# Patient Record
Sex: Male | Born: 1950 | Race: Black or African American | Hispanic: No | Marital: Married | Smoking: Former smoker
Health system: Southern US, Community
[De-identification: ages and names within clinical notes are randomized; demographics above are authoritative.]

## PROBLEM LIST (undated history)

## (undated) DIAGNOSIS — I10 Essential (primary) hypertension: Secondary | ICD-10-CM

## (undated) DIAGNOSIS — E119 Type 2 diabetes mellitus without complications: Secondary | ICD-10-CM

## (undated) DIAGNOSIS — K219 Gastro-esophageal reflux disease without esophagitis: Secondary | ICD-10-CM

## (undated) DIAGNOSIS — C189 Malignant neoplasm of colon, unspecified: Secondary | ICD-10-CM

## (undated) HISTORY — DX: Type 2 diabetes mellitus without complications: E11.9

## (undated) HISTORY — PX: COLON SURGERY: SHX602

## (undated) HISTORY — DX: Malignant neoplasm of colon, unspecified: C18.9

## (undated) HISTORY — PX: APPENDECTOMY: SHX54

---

## 2001-04-29 HISTORY — PX: SHOULDER SURGERY: SHX246

## 2015-03-20 ENCOUNTER — Encounter: Payer: Self-pay | Admitting: Gastroenterology

## 2015-04-05 ENCOUNTER — Other Ambulatory Visit: Payer: Self-pay

## 2015-04-05 ENCOUNTER — Encounter: Payer: Self-pay | Admitting: Gastroenterology

## 2015-04-05 ENCOUNTER — Ambulatory Visit (INDEPENDENT_AMBULATORY_CARE_PROVIDER_SITE_OTHER): Payer: PRIVATE HEALTH INSURANCE | Admitting: Gastroenterology

## 2015-04-05 VITALS — BP 149/96 | HR 79 | Temp 97.9°F | Ht 71.0 in | Wt 219.8 lb

## 2015-04-05 DIAGNOSIS — D126 Benign neoplasm of colon, unspecified: Secondary | ICD-10-CM | POA: Insufficient documentation

## 2015-04-05 DIAGNOSIS — K635 Polyp of colon: Secondary | ICD-10-CM

## 2015-04-05 DIAGNOSIS — K219 Gastro-esophageal reflux disease without esophagitis: Secondary | ICD-10-CM | POA: Diagnosis not present

## 2015-04-05 MED ORDER — OMEPRAZOLE 20 MG PO CPDR: DELAYED_RELEASE_CAPSULE | ORAL | Status: DC

## 2015-04-05 NOTE — Assessment & Plan Note (Signed)
SYMPTOMS NOT CONTROLLED.  DRINK WATER TO KEEP YOUR URINE LIGHT YELLOW. FOLLOW A HIGH FIBER/LOW FAT DIET. AVOID FRIED FOODS. AVOID ITEMS THAT CAUSE BLOATING & GAS. SEE INFO BELOW. AVOID FOOD THAT TRIGGERS REFLUX. SEE INFO BELOW.  START OMEPRAZOLE.  TAKE 30 MINUTES PRIOR TO YOUR FIRST MEAL EVERY DAY TO PREVENT ULCERS AND CONTROL HEART BURN. FOLLOW UP IN 4 MOS.

## 2015-04-05 NOTE — Progress Notes (Signed)
CC'ED TO PCP 

## 2015-04-05 NOTE — Patient Instructions (Signed)
DRINK WATER TO KEEP YOUR URINE LIGHT YELLOW.  FOLLOW A HIGH FIBER/LOW FAT DIET. AVOID FRIED FOODS. AVOID ITEMS THAT CAUSE BLOATING & GAS. SEE INFO BELOW.  AVOID FOOD THAT TRIGGERS REFLUX. SEE INFO BELOW.   START OMEPRAZOLE.  TAKE 30 MINUTES PRIOR TO YOUR FIRST MEAL EVERY DAY TO PREVENT ULCERS AND CONTROL HEART BURN.   COMPLETE ENDOSCOPIC MUCOSAL RESECTION AT [facility]. WE WILL TRY TO GET IT DONE BEFORE DEC 31.  FOLLOW UP IN 4 MOS.   YOU NEED A COLONOSCOPY IN ONE YEAR.   Lifestyle and home remedies TO CONTROL HEARTBURN You may eliminate or reduce the frequency of heartburn by making the following lifestyle changes:  . Control your weight. Being overweight is a major risk factor for heartburn and GERD. Excess pounds put pressure on your abdomen, pushing up your stomach and causing acid to back up into your esophagus.   . Eat smaller meals. 4 TO 6 MEALS A DAY. This reduces pressure on the lower esophageal sphincter, helping to prevent the valve from opening and acid from washing back into your esophagus.   Christopher Friedman your belt. Clothes that fit tightly around your waist put pressure on your abdomen and the lower esophageal sphincter.   . Eliminate heartburn triggers. Everyone has specific triggers. Common triggers such as fatty or fried foods, spicy food, tomato sauce, carbonated beverages, alcohol, chocolate, mint, garlic, onion, caffeine and nicotine may make heartburn worse.   Marland Kitchen Avoid stooping or bending. Tying your shoes is OK. Bending over for longer periods to weed your garden isn't, especially soon after eating.   . Don't lie down after a meal. Wait at least three to four hours after eating before going to bed, and don't lie down right after eating.   Alternative medicine . Several home remedies exist for treating GERD, but they provide only temporary relief. They include drinking baking soda (sodium bicarbonate) added to water or drinking other fluids such as baking soda mixed with  cream of tartar and water. . Although these liquids create temporary relief by neutralizing, washing away or buffering acids, eventually they aggravate the situation by adding gas and fluid to your stomach, increasing pressure and causing more acid reflux. Further, adding more sodium to your diet may increase your blood pressure and add stress to your heart, and excessive bicarbonate ingestion can alter the acid-base balance in your body.  High-Fiber Diet A high-fiber diet changes your normal diet to include more whole grains, legumes, fruits, and vegetables. Changes in the diet involve replacing refined carbohydrates with unrefined foods. The calorie level of the diet is essentially unchanged. The Dietary Reference Intake (recommended amount) for adult males is 38 grams per day. For adult females, it is 25 grams per day. Pregnant and lactating women should consume 28 grams of fiber per day. Fiber is the intact part of a plant that is not broken down during digestion. Functional fiber is fiber that has been isolated from the plant to provide a beneficial effect in the body. PURPOSE  Increase stool bulk.   Ease and regulate bowel movements.   Lower cholesterol.  REDUCE RISK OF COLON CANCER  INDICATIONS THAT YOU NEED MORE FIBER  Constipation and hemorrhoids.   Uncomplicated diverticulosis (intestine condition) and irritable bowel syndrome.   Weight management.   As a protective measure against hardening of the arteries (atherosclerosis), diabetes, and cancer.   GUIDELINES FOR INCREASING FIBER IN THE DIET  Start adding fiber to the diet slowly. A gradual increase of  about 5 more grams (2 slices of whole-wheat bread, 2 servings of most fruits or vegetables, or 1 bowl of high-fiber cereal) per day is best. Too rapid an increase in fiber may result in constipation, flatulence, and bloating.   Drink enough water and fluids to keep your urine clear or pale yellow. Water, juice, or caffeine-free  drinks are recommended. Not drinking enough fluid may cause constipation.   Eat a variety of high-fiber foods rather than one type of fiber.   Try to increase your intake of fiber through using high-fiber foods rather than fiber pills or supplements that contain small amounts of fiber.   The goal is to change the types of food eaten. Do not supplement your present diet with high-fiber foods, but replace foods in your present diet.  INCLUDE A VARIETY OF FIBER SOURCES  Replace refined and processed grains with whole grains, canned fruits with fresh fruits, and incorporate other fiber sources. White rice, white breads, and most bakery goods contain little or no fiber.   Brown whole-grain rice, buckwheat oats, and many fruits and vegetables are all good sources of fiber. These include: broccoli, Brussels sprouts, cabbage, cauliflower, beets, sweet potatoes, white potatoes (skin on), carrots, tomatoes, eggplant, squash, berries, fresh fruits, and dried fruits.   Cereals appear to be the richest source of fiber. Cereal fiber is found in whole grains and bran. Bran is the fiber-rich outer coat of cereal grain, which is largely removed in refining. In whole-grain cereals, the bran remains. In breakfast cereals, the largest amount of fiber is found in those with "bran" in their names. The fiber content is sometimes indicated on the label.   You may need to include additional fruits and vegetables each day.   In baking, for 1 cup white flour, you may use the following substitutions:   1 cup whole-wheat flour minus 2 tablespoons.   1/2 cup white flour plus 1/2 cup whole-wheat flour.

## 2015-04-05 NOTE — Progress Notes (Signed)
   Subjective:    Patient ID: Christopher Friedman, male    DOB: April 07, 1951, 64 y.o.   MRN: [id number] [name], MD  HPI Pt and wife concerned because he did not want to have surgery. [place] RUSHED. HESITANT TO HAVE SURGERY DUE TO PREVIOUS COMPLICATION OF SURGERY FROM FAMILY MEDICINE. HAS R INGUINAL HERNIA. HAD FIRST TCS NOV 2016 FOR RECTAL BLEEDING. NO BRBPR now. Bowels regular. No weight loss or change in appetite. Heartburn frequently. PT DENIES FEVER, CHILLS, HEMATOCHEZIA, nausea, vomiting, melena, diarrhea, CHANGE IN BOWEL IN HABITS, constipation, abdominal pain, or roblems swallowing.  Past Medical History  Diagnosis Date  . Diabetes [facility])     Past Surgical History  Procedure Laterality Date  . Shoulder surgery  2003    No Known Allergies  Current Outpatient Prescriptions  Medication Sig Dispense Refill  . Ascorbic Acid (VITAMIN C) 1000 MG tablet Take 1,000 mg by mouth daily.    Marland Kitchen aspirin 81 MG tablet Take 81 mg by mouth daily.    Marland Kitchen BEE POLLEN PO Take by mouth.    . Blood Glucose Monitoring Suppl (ONE TOUCH ULTRA MINI) W/DEVICE KIT USE TO CHECK BLOOD SUGAR DAILY.    Marland Kitchen Coenzyme Q10 (CO Q 10 PO) Take by mouth.    .      . Glucosamine-Chondroit-Collagen ([employer] GLUCO-CHONDROIT PLUS UC-II PO) Take by mouth.     Family History  Problem Relation Age of Onset  . Colon cancer Neg Hx   . Colon polyps Neg Hx     Social History  Substance Use Topics  . Smoking status: Former Smoker    Quit date: 04/05/1995  . Smokeless tobacco: None  . Alcohol Use: None   Review of Systems PER HPI OTHERWISE ALL SYSTEMS ARE NEGATIVE.    Objective:   Physical Exam  Constitutional: He is oriented to person, place, and time. He appears well-developed and well-nourished. No distress.  HENT:  Head: Normocephalic and atraumatic.  Mouth/Throat: Oropharynx is clear and moist. No oropharyngeal exudate.  Eyes: Pupils are equal, round, and reactive to light. No scleral icterus.  Neck: Normal range of  motion. Neck supple.  Cardiovascular: Normal rate, regular rhythm and normal heart sounds.   Pulmonary/Chest: Effort normal and breath sounds normal. No respiratory distress.  Abdominal: Soft. Bowel sounds are normal. He exhibits no distension. There is no tenderness.  Musculoskeletal: He exhibits no edema.  Lymphadenopathy:    He has no cervical adenopathy.  Neurological: He is alert and oriented to person, place, and time.  NO FOCAL DEFICITS  Psychiatric: He has a normal mood and affect.  Vitals reviewed.     Assessment & Plan:

## 2015-04-05 NOTE — Progress Notes (Signed)
ON RECALL  °

## 2015-04-05 NOTE — Assessment & Plan Note (Signed)
IN PROXIMAL COLON AND NEAR GE JUNCTION(??). 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  DRINK WATER TO KEEP YOUR URINE LIGHT YELLOW. FOLLOW A HIGH FIBER/LOW FAT DIET. AVOID FRIED FOODS. AVOID ITEMS THAT CAUSE BLOATING & GAS. SEE INFO BELOW. AVOID FOOD THAT TRIGGERS REFLUX. SEE INFO BELOW.  START OMEPRAZOLE.  TAKE 30 MINUTES PRIOR TO YOUR FIRST MEAL EVERY DAY TO PREVENT ULCERS AND CONTROL HEART BURN. COMPLETE ENDOSCOPIC MUCOSAL RESECTION AT [facility]. WE WILL TRY TO GET IT DONE BEFORE DEC 31. DISCUSSED PROCEDURE, BENEFITS, RISKS, AND MANAGEMENT OF ADVANCED POLYP/ENDOSCOPY. FOLLOW UP IN 4 MOS.   YOU NEED A COLONOSCOPY IN ONE YEAR.

## 2015-07-19 ENCOUNTER — Encounter: Payer: Self-pay | Admitting: Gastroenterology

## 2016-02-29 DIAGNOSIS — C189 Malignant neoplasm of colon, unspecified: Secondary | ICD-10-CM | POA: Diagnosis not present

## 2016-02-29 DIAGNOSIS — Z452 Encounter for adjustment and management of vascular access device: Secondary | ICD-10-CM | POA: Diagnosis not present

## 2016-02-29 DIAGNOSIS — C787 Secondary malignant neoplasm of liver and intrahepatic bile duct: Secondary | ICD-10-CM | POA: Diagnosis not present

## 2016-03-06 ENCOUNTER — Encounter: Payer: Self-pay | Admitting: Gastroenterology

## 2016-03-12 DIAGNOSIS — C787 Secondary malignant neoplasm of liver and intrahepatic bile duct: Secondary | ICD-10-CM | POA: Diagnosis not present

## 2016-03-12 DIAGNOSIS — Z452 Encounter for adjustment and management of vascular access device: Secondary | ICD-10-CM | POA: Diagnosis not present

## 2016-03-12 DIAGNOSIS — C189 Malignant neoplasm of colon, unspecified: Secondary | ICD-10-CM | POA: Diagnosis not present

## 2016-03-14 DIAGNOSIS — C189 Malignant neoplasm of colon, unspecified: Secondary | ICD-10-CM | POA: Diagnosis not present

## 2016-03-14 DIAGNOSIS — Z452 Encounter for adjustment and management of vascular access device: Secondary | ICD-10-CM | POA: Diagnosis not present

## 2016-03-14 DIAGNOSIS — C787 Secondary malignant neoplasm of liver and intrahepatic bile duct: Secondary | ICD-10-CM | POA: Diagnosis not present

## 2016-03-26 DIAGNOSIS — C189 Malignant neoplasm of colon, unspecified: Secondary | ICD-10-CM | POA: Diagnosis not present

## 2016-03-26 DIAGNOSIS — Z452 Encounter for adjustment and management of vascular access device: Secondary | ICD-10-CM | POA: Diagnosis not present

## 2016-03-26 DIAGNOSIS — C787 Secondary malignant neoplasm of liver and intrahepatic bile duct: Secondary | ICD-10-CM | POA: Diagnosis not present

## 2016-03-28 DIAGNOSIS — Z452 Encounter for adjustment and management of vascular access device: Secondary | ICD-10-CM | POA: Diagnosis not present

## 2016-03-28 DIAGNOSIS — C787 Secondary malignant neoplasm of liver and intrahepatic bile duct: Secondary | ICD-10-CM | POA: Diagnosis not present

## 2016-03-28 DIAGNOSIS — C189 Malignant neoplasm of colon, unspecified: Secondary | ICD-10-CM | POA: Diagnosis not present

## 2016-04-08 DIAGNOSIS — Z7982 Long term (current) use of aspirin: Secondary | ICD-10-CM | POA: Diagnosis not present

## 2016-04-08 DIAGNOSIS — R51 Headache: Secondary | ICD-10-CM | POA: Diagnosis not present

## 2016-04-08 DIAGNOSIS — M13869 Other specified arthritis, unspecified knee: Secondary | ICD-10-CM | POA: Diagnosis not present

## 2016-04-08 DIAGNOSIS — E119 Type 2 diabetes mellitus without complications: Secondary | ICD-10-CM | POA: Diagnosis not present

## 2016-04-08 DIAGNOSIS — C189 Malignant neoplasm of colon, unspecified: Secondary | ICD-10-CM | POA: Diagnosis not present

## 2016-04-08 DIAGNOSIS — I10 Essential (primary) hypertension: Secondary | ICD-10-CM | POA: Diagnosis not present

## 2016-04-08 DIAGNOSIS — K219 Gastro-esophageal reflux disease without esophagitis: Secondary | ICD-10-CM | POA: Diagnosis not present

## 2016-04-09 DIAGNOSIS — C189 Malignant neoplasm of colon, unspecified: Secondary | ICD-10-CM | POA: Diagnosis not present

## 2016-04-18 DIAGNOSIS — Z7982 Long term (current) use of aspirin: Secondary | ICD-10-CM | POA: Diagnosis not present

## 2016-04-18 DIAGNOSIS — K219 Gastro-esophageal reflux disease without esophagitis: Secondary | ICD-10-CM | POA: Diagnosis not present

## 2016-04-18 DIAGNOSIS — M13869 Other specified arthritis, unspecified knee: Secondary | ICD-10-CM | POA: Diagnosis not present

## 2016-04-18 DIAGNOSIS — C189 Malignant neoplasm of colon, unspecified: Secondary | ICD-10-CM | POA: Diagnosis not present

## 2016-04-18 DIAGNOSIS — E119 Type 2 diabetes mellitus without complications: Secondary | ICD-10-CM | POA: Diagnosis not present

## 2016-04-18 DIAGNOSIS — I10 Essential (primary) hypertension: Secondary | ICD-10-CM | POA: Diagnosis not present

## 2016-04-23 DIAGNOSIS — Z7982 Long term (current) use of aspirin: Secondary | ICD-10-CM | POA: Diagnosis not present

## 2016-04-23 DIAGNOSIS — E119 Type 2 diabetes mellitus without complications: Secondary | ICD-10-CM | POA: Diagnosis not present

## 2016-04-23 DIAGNOSIS — C189 Malignant neoplasm of colon, unspecified: Secondary | ICD-10-CM | POA: Diagnosis not present

## 2016-04-23 DIAGNOSIS — I10 Essential (primary) hypertension: Secondary | ICD-10-CM | POA: Diagnosis not present

## 2016-04-23 DIAGNOSIS — M13869 Other specified arthritis, unspecified knee: Secondary | ICD-10-CM | POA: Diagnosis not present

## 2016-04-23 DIAGNOSIS — K219 Gastro-esophageal reflux disease without esophagitis: Secondary | ICD-10-CM | POA: Diagnosis not present

## 2016-05-20 DIAGNOSIS — Z7901 Long term (current) use of anticoagulants: Secondary | ICD-10-CM | POA: Diagnosis not present

## 2016-05-20 DIAGNOSIS — Z79899 Other long term (current) drug therapy: Secondary | ICD-10-CM | POA: Diagnosis not present

## 2016-05-20 DIAGNOSIS — C189 Malignant neoplasm of colon, unspecified: Secondary | ICD-10-CM | POA: Diagnosis not present

## 2016-05-20 DIAGNOSIS — I10 Essential (primary) hypertension: Secondary | ICD-10-CM | POA: Diagnosis not present

## 2016-05-20 DIAGNOSIS — M179 Osteoarthritis of knee, unspecified: Secondary | ICD-10-CM | POA: Diagnosis not present

## 2016-05-20 DIAGNOSIS — Z9221 Personal history of antineoplastic chemotherapy: Secondary | ICD-10-CM | POA: Diagnosis not present

## 2016-05-20 DIAGNOSIS — R51 Headache: Secondary | ICD-10-CM | POA: Diagnosis not present

## 2016-05-20 DIAGNOSIS — C7801 Secondary malignant neoplasm of right lung: Secondary | ICD-10-CM | POA: Diagnosis not present

## 2016-05-20 DIAGNOSIS — K219 Gastro-esophageal reflux disease without esophagitis: Secondary | ICD-10-CM | POA: Diagnosis not present

## 2016-05-20 DIAGNOSIS — C787 Secondary malignant neoplasm of liver and intrahepatic bile duct: Secondary | ICD-10-CM | POA: Diagnosis not present

## 2016-05-20 DIAGNOSIS — K4090 Unilateral inguinal hernia, without obstruction or gangrene, not specified as recurrent: Secondary | ICD-10-CM | POA: Diagnosis not present

## 2016-05-20 DIAGNOSIS — Z6830 Body mass index (BMI) 30.0-30.9, adult: Secondary | ICD-10-CM | POA: Diagnosis not present

## 2016-05-20 DIAGNOSIS — E119 Type 2 diabetes mellitus without complications: Secondary | ICD-10-CM | POA: Diagnosis not present

## 2016-05-21 DIAGNOSIS — C189 Malignant neoplasm of colon, unspecified: Secondary | ICD-10-CM | POA: Diagnosis not present

## 2016-07-09 DIAGNOSIS — E1165 Type 2 diabetes mellitus with hyperglycemia: Secondary | ICD-10-CM | POA: Diagnosis not present

## 2016-07-09 DIAGNOSIS — C187 Malignant neoplasm of sigmoid colon: Secondary | ICD-10-CM | POA: Diagnosis not present

## 2016-07-09 DIAGNOSIS — Z6830 Body mass index (BMI) 30.0-30.9, adult: Secondary | ICD-10-CM | POA: Diagnosis not present

## 2016-07-09 DIAGNOSIS — E8881 Metabolic syndrome: Secondary | ICD-10-CM | POA: Diagnosis not present

## 2016-07-09 DIAGNOSIS — E782 Mixed hyperlipidemia: Secondary | ICD-10-CM | POA: Diagnosis not present

## 2016-07-09 DIAGNOSIS — Z9189 Other specified personal risk factors, not elsewhere classified: Secondary | ICD-10-CM | POA: Diagnosis not present

## 2016-07-10 DIAGNOSIS — C189 Malignant neoplasm of colon, unspecified: Secondary | ICD-10-CM | POA: Diagnosis not present

## 2016-07-10 DIAGNOSIS — G629 Polyneuropathy, unspecified: Secondary | ICD-10-CM | POA: Diagnosis not present

## 2016-07-10 DIAGNOSIS — C787 Secondary malignant neoplasm of liver and intrahepatic bile duct: Secondary | ICD-10-CM | POA: Diagnosis not present

## 2016-07-10 DIAGNOSIS — I10 Essential (primary) hypertension: Secondary | ICD-10-CM | POA: Diagnosis not present

## 2016-07-10 DIAGNOSIS — C7800 Secondary malignant neoplasm of unspecified lung: Secondary | ICD-10-CM | POA: Diagnosis not present

## 2016-07-10 DIAGNOSIS — Z9221 Personal history of antineoplastic chemotherapy: Secondary | ICD-10-CM | POA: Diagnosis not present

## 2016-07-10 DIAGNOSIS — E119 Type 2 diabetes mellitus without complications: Secondary | ICD-10-CM | POA: Diagnosis not present

## 2016-07-10 DIAGNOSIS — M179 Osteoarthritis of knee, unspecified: Secondary | ICD-10-CM | POA: Diagnosis not present

## 2016-07-10 DIAGNOSIS — K219 Gastro-esophageal reflux disease without esophagitis: Secondary | ICD-10-CM | POA: Diagnosis not present

## 2016-07-10 DIAGNOSIS — Z7982 Long term (current) use of aspirin: Secondary | ICD-10-CM | POA: Diagnosis not present

## 2016-07-16 DIAGNOSIS — C189 Malignant neoplasm of colon, unspecified: Secondary | ICD-10-CM | POA: Diagnosis not present

## 2016-09-16 ENCOUNTER — Encounter: Payer: Self-pay | Admitting: Hematology

## 2016-09-16 DIAGNOSIS — G629 Polyneuropathy, unspecified: Secondary | ICD-10-CM | POA: Diagnosis not present

## 2016-09-16 DIAGNOSIS — Z79899 Other long term (current) drug therapy: Secondary | ICD-10-CM | POA: Diagnosis not present

## 2016-09-16 DIAGNOSIS — C189 Malignant neoplasm of colon, unspecified: Secondary | ICD-10-CM | POA: Diagnosis not present

## 2016-09-16 DIAGNOSIS — C787 Secondary malignant neoplasm of liver and intrahepatic bile duct: Secondary | ICD-10-CM | POA: Diagnosis not present

## 2016-09-16 DIAGNOSIS — K219 Gastro-esophageal reflux disease without esophagitis: Secondary | ICD-10-CM | POA: Diagnosis not present

## 2016-09-16 DIAGNOSIS — M179 Osteoarthritis of knee, unspecified: Secondary | ICD-10-CM | POA: Diagnosis not present

## 2016-09-16 DIAGNOSIS — Z7984 Long term (current) use of oral hypoglycemic drugs: Secondary | ICD-10-CM | POA: Diagnosis not present

## 2016-09-16 DIAGNOSIS — E119 Type 2 diabetes mellitus without complications: Secondary | ICD-10-CM | POA: Diagnosis not present

## 2016-09-16 DIAGNOSIS — C7800 Secondary malignant neoplasm of unspecified lung: Secondary | ICD-10-CM | POA: Diagnosis not present

## 2016-09-16 DIAGNOSIS — I10 Essential (primary) hypertension: Secondary | ICD-10-CM | POA: Diagnosis not present

## 2016-09-16 DIAGNOSIS — Z7982 Long term (current) use of aspirin: Secondary | ICD-10-CM | POA: Diagnosis not present

## 2016-09-16 DIAGNOSIS — K4090 Unilateral inguinal hernia, without obstruction or gangrene, not specified as recurrent: Secondary | ICD-10-CM | POA: Diagnosis not present

## 2016-09-20 DIAGNOSIS — C189 Malignant neoplasm of colon, unspecified: Secondary | ICD-10-CM | POA: Diagnosis not present

## 2016-11-22 DIAGNOSIS — K219 Gastro-esophageal reflux disease without esophagitis: Secondary | ICD-10-CM | POA: Diagnosis not present

## 2016-11-22 DIAGNOSIS — I10 Essential (primary) hypertension: Secondary | ICD-10-CM | POA: Diagnosis not present

## 2016-11-22 DIAGNOSIS — C7800 Secondary malignant neoplasm of unspecified lung: Secondary | ICD-10-CM | POA: Diagnosis not present

## 2016-11-22 DIAGNOSIS — C189 Malignant neoplasm of colon, unspecified: Secondary | ICD-10-CM | POA: Diagnosis not present

## 2016-11-22 DIAGNOSIS — E119 Type 2 diabetes mellitus without complications: Secondary | ICD-10-CM | POA: Diagnosis not present

## 2016-11-22 DIAGNOSIS — M179 Osteoarthritis of knee, unspecified: Secondary | ICD-10-CM | POA: Diagnosis not present

## 2016-11-22 DIAGNOSIS — C787 Secondary malignant neoplasm of liver and intrahepatic bile duct: Secondary | ICD-10-CM | POA: Diagnosis not present

## 2016-11-22 DIAGNOSIS — M1711 Unilateral primary osteoarthritis, right knee: Secondary | ICD-10-CM | POA: Diagnosis not present

## 2016-11-22 DIAGNOSIS — G629 Polyneuropathy, unspecified: Secondary | ICD-10-CM | POA: Diagnosis not present

## 2016-11-26 DIAGNOSIS — C189 Malignant neoplasm of colon, unspecified: Secondary | ICD-10-CM | POA: Diagnosis not present

## 2017-01-07 DIAGNOSIS — Z9189 Other specified personal risk factors, not elsewhere classified: Secondary | ICD-10-CM | POA: Diagnosis not present

## 2017-01-07 DIAGNOSIS — Z0001 Encounter for general adult medical examination with abnormal findings: Secondary | ICD-10-CM | POA: Diagnosis not present

## 2017-01-07 DIAGNOSIS — C187 Malignant neoplasm of sigmoid colon: Secondary | ICD-10-CM | POA: Diagnosis not present

## 2017-01-07 DIAGNOSIS — Z1389 Encounter for screening for other disorder: Secondary | ICD-10-CM | POA: Diagnosis not present

## 2017-01-07 DIAGNOSIS — E8881 Metabolic syndrome: Secondary | ICD-10-CM | POA: Diagnosis not present

## 2017-01-07 DIAGNOSIS — Z6829 Body mass index (BMI) 29.0-29.9, adult: Secondary | ICD-10-CM | POA: Diagnosis not present

## 2017-01-07 DIAGNOSIS — E782 Mixed hyperlipidemia: Secondary | ICD-10-CM | POA: Diagnosis not present

## 2017-01-07 DIAGNOSIS — Z23 Encounter for immunization: Secondary | ICD-10-CM | POA: Diagnosis not present

## 2017-01-07 DIAGNOSIS — E1165 Type 2 diabetes mellitus with hyperglycemia: Secondary | ICD-10-CM | POA: Diagnosis not present

## 2017-01-16 DIAGNOSIS — E8881 Metabolic syndrome: Secondary | ICD-10-CM | POA: Diagnosis not present

## 2017-01-16 DIAGNOSIS — Z9189 Other specified personal risk factors, not elsewhere classified: Secondary | ICD-10-CM | POA: Diagnosis not present

## 2017-01-23 DIAGNOSIS — C189 Malignant neoplasm of colon, unspecified: Secondary | ICD-10-CM | POA: Diagnosis not present

## 2017-01-23 DIAGNOSIS — M1711 Unilateral primary osteoarthritis, right knee: Secondary | ICD-10-CM | POA: Diagnosis not present

## 2017-01-23 DIAGNOSIS — I10 Essential (primary) hypertension: Secondary | ICD-10-CM | POA: Diagnosis not present

## 2017-01-23 DIAGNOSIS — C787 Secondary malignant neoplasm of liver and intrahepatic bile duct: Secondary | ICD-10-CM | POA: Diagnosis not present

## 2017-01-23 DIAGNOSIS — C7800 Secondary malignant neoplasm of unspecified lung: Secondary | ICD-10-CM | POA: Diagnosis not present

## 2017-01-23 DIAGNOSIS — K219 Gastro-esophageal reflux disease without esophagitis: Secondary | ICD-10-CM | POA: Diagnosis not present

## 2017-01-23 DIAGNOSIS — E119 Type 2 diabetes mellitus without complications: Secondary | ICD-10-CM | POA: Diagnosis not present

## 2017-01-28 DIAGNOSIS — D72829 Elevated white blood cell count, unspecified: Secondary | ICD-10-CM | POA: Diagnosis not present

## 2017-01-28 DIAGNOSIS — C189 Malignant neoplasm of colon, unspecified: Secondary | ICD-10-CM | POA: Diagnosis not present

## 2017-05-06 DIAGNOSIS — I10 Essential (primary) hypertension: Secondary | ICD-10-CM | POA: Diagnosis not present

## 2017-05-06 DIAGNOSIS — Z85118 Personal history of other malignant neoplasm of bronchus and lung: Secondary | ICD-10-CM | POA: Diagnosis not present

## 2017-05-06 DIAGNOSIS — Z7982 Long term (current) use of aspirin: Secondary | ICD-10-CM | POA: Diagnosis not present

## 2017-05-06 DIAGNOSIS — C189 Malignant neoplasm of colon, unspecified: Secondary | ICD-10-CM | POA: Diagnosis not present

## 2017-05-06 DIAGNOSIS — G629 Polyneuropathy, unspecified: Secondary | ICD-10-CM | POA: Diagnosis not present

## 2017-05-06 DIAGNOSIS — Z9221 Personal history of antineoplastic chemotherapy: Secondary | ICD-10-CM | POA: Diagnosis not present

## 2017-05-06 DIAGNOSIS — E119 Type 2 diabetes mellitus without complications: Secondary | ICD-10-CM | POA: Diagnosis not present

## 2017-05-06 DIAGNOSIS — Z7984 Long term (current) use of oral hypoglycemic drugs: Secondary | ICD-10-CM | POA: Diagnosis not present

## 2017-05-06 DIAGNOSIS — R51 Headache: Secondary | ICD-10-CM | POA: Diagnosis not present

## 2017-05-06 DIAGNOSIS — Z85038 Personal history of other malignant neoplasm of large intestine: Secondary | ICD-10-CM | POA: Diagnosis not present

## 2017-05-13 DIAGNOSIS — I10 Essential (primary) hypertension: Secondary | ICD-10-CM | POA: Diagnosis not present

## 2017-05-13 DIAGNOSIS — G629 Polyneuropathy, unspecified: Secondary | ICD-10-CM | POA: Diagnosis not present

## 2017-05-13 DIAGNOSIS — R51 Headache: Secondary | ICD-10-CM | POA: Diagnosis not present

## 2017-05-13 DIAGNOSIS — Z85118 Personal history of other malignant neoplasm of bronchus and lung: Secondary | ICD-10-CM | POA: Diagnosis not present

## 2017-05-13 DIAGNOSIS — Z7984 Long term (current) use of oral hypoglycemic drugs: Secondary | ICD-10-CM | POA: Diagnosis not present

## 2017-05-13 DIAGNOSIS — Z7982 Long term (current) use of aspirin: Secondary | ICD-10-CM | POA: Diagnosis not present

## 2017-05-13 DIAGNOSIS — C189 Malignant neoplasm of colon, unspecified: Secondary | ICD-10-CM | POA: Diagnosis not present

## 2017-05-13 DIAGNOSIS — Z9221 Personal history of antineoplastic chemotherapy: Secondary | ICD-10-CM | POA: Diagnosis not present

## 2017-05-13 DIAGNOSIS — Z85038 Personal history of other malignant neoplasm of large intestine: Secondary | ICD-10-CM | POA: Diagnosis not present

## 2017-05-13 DIAGNOSIS — E119 Type 2 diabetes mellitus without complications: Secondary | ICD-10-CM | POA: Diagnosis not present

## 2017-05-14 DIAGNOSIS — Z85038 Personal history of other malignant neoplasm of large intestine: Secondary | ICD-10-CM | POA: Diagnosis not present

## 2017-05-14 DIAGNOSIS — C189 Malignant neoplasm of colon, unspecified: Secondary | ICD-10-CM | POA: Diagnosis not present

## 2017-05-14 DIAGNOSIS — Z7984 Long term (current) use of oral hypoglycemic drugs: Secondary | ICD-10-CM | POA: Diagnosis not present

## 2017-05-14 DIAGNOSIS — R51 Headache: Secondary | ICD-10-CM | POA: Diagnosis not present

## 2017-05-14 DIAGNOSIS — I10 Essential (primary) hypertension: Secondary | ICD-10-CM | POA: Diagnosis not present

## 2017-05-14 DIAGNOSIS — Z9221 Personal history of antineoplastic chemotherapy: Secondary | ICD-10-CM | POA: Diagnosis not present

## 2017-05-14 DIAGNOSIS — Z85118 Personal history of other malignant neoplasm of bronchus and lung: Secondary | ICD-10-CM | POA: Diagnosis not present

## 2017-05-14 DIAGNOSIS — G629 Polyneuropathy, unspecified: Secondary | ICD-10-CM | POA: Diagnosis not present

## 2017-05-14 DIAGNOSIS — E119 Type 2 diabetes mellitus without complications: Secondary | ICD-10-CM | POA: Diagnosis not present

## 2017-05-14 DIAGNOSIS — Z7982 Long term (current) use of aspirin: Secondary | ICD-10-CM | POA: Diagnosis not present

## 2017-05-21 DIAGNOSIS — Z9221 Personal history of antineoplastic chemotherapy: Secondary | ICD-10-CM | POA: Diagnosis not present

## 2017-05-21 DIAGNOSIS — Z85038 Personal history of other malignant neoplasm of large intestine: Secondary | ICD-10-CM | POA: Diagnosis not present

## 2017-05-21 DIAGNOSIS — C189 Malignant neoplasm of colon, unspecified: Secondary | ICD-10-CM | POA: Diagnosis not present

## 2017-05-21 DIAGNOSIS — R51 Headache: Secondary | ICD-10-CM | POA: Diagnosis not present

## 2017-05-21 DIAGNOSIS — G629 Polyneuropathy, unspecified: Secondary | ICD-10-CM | POA: Diagnosis not present

## 2017-05-21 DIAGNOSIS — Z85118 Personal history of other malignant neoplasm of bronchus and lung: Secondary | ICD-10-CM | POA: Diagnosis not present

## 2017-05-21 DIAGNOSIS — I10 Essential (primary) hypertension: Secondary | ICD-10-CM | POA: Diagnosis not present

## 2017-05-21 DIAGNOSIS — E119 Type 2 diabetes mellitus without complications: Secondary | ICD-10-CM | POA: Diagnosis not present

## 2017-05-21 DIAGNOSIS — Z7982 Long term (current) use of aspirin: Secondary | ICD-10-CM | POA: Diagnosis not present

## 2017-05-21 DIAGNOSIS — Z7984 Long term (current) use of oral hypoglycemic drugs: Secondary | ICD-10-CM | POA: Diagnosis not present

## 2017-05-23 DIAGNOSIS — I10 Essential (primary) hypertension: Secondary | ICD-10-CM | POA: Diagnosis not present

## 2017-05-23 DIAGNOSIS — Z85038 Personal history of other malignant neoplasm of large intestine: Secondary | ICD-10-CM | POA: Diagnosis not present

## 2017-05-23 DIAGNOSIS — Z7984 Long term (current) use of oral hypoglycemic drugs: Secondary | ICD-10-CM | POA: Diagnosis not present

## 2017-05-23 DIAGNOSIS — Z9221 Personal history of antineoplastic chemotherapy: Secondary | ICD-10-CM | POA: Diagnosis not present

## 2017-05-23 DIAGNOSIS — Z85118 Personal history of other malignant neoplasm of bronchus and lung: Secondary | ICD-10-CM | POA: Diagnosis not present

## 2017-05-23 DIAGNOSIS — G629 Polyneuropathy, unspecified: Secondary | ICD-10-CM | POA: Diagnosis not present

## 2017-05-23 DIAGNOSIS — E119 Type 2 diabetes mellitus without complications: Secondary | ICD-10-CM | POA: Diagnosis not present

## 2017-05-23 DIAGNOSIS — Z7982 Long term (current) use of aspirin: Secondary | ICD-10-CM | POA: Diagnosis not present

## 2017-05-23 DIAGNOSIS — R51 Headache: Secondary | ICD-10-CM | POA: Diagnosis not present

## 2017-06-04 DIAGNOSIS — Z452 Encounter for adjustment and management of vascular access device: Secondary | ICD-10-CM | POA: Diagnosis not present

## 2017-06-04 DIAGNOSIS — E1142 Type 2 diabetes mellitus with diabetic polyneuropathy: Secondary | ICD-10-CM | POA: Diagnosis not present

## 2017-06-04 DIAGNOSIS — C189 Malignant neoplasm of colon, unspecified: Secondary | ICD-10-CM | POA: Diagnosis not present

## 2017-06-04 DIAGNOSIS — I10 Essential (primary) hypertension: Secondary | ICD-10-CM | POA: Diagnosis not present

## 2017-06-04 DIAGNOSIS — M13869 Other specified arthritis, unspecified knee: Secondary | ICD-10-CM | POA: Diagnosis not present

## 2017-06-04 DIAGNOSIS — Z5111 Encounter for antineoplastic chemotherapy: Secondary | ICD-10-CM | POA: Diagnosis not present

## 2017-06-04 DIAGNOSIS — R160 Hepatomegaly, not elsewhere classified: Secondary | ICD-10-CM | POA: Diagnosis not present

## 2017-06-04 DIAGNOSIS — E119 Type 2 diabetes mellitus without complications: Secondary | ICD-10-CM | POA: Diagnosis not present

## 2017-06-04 DIAGNOSIS — K219 Gastro-esophageal reflux disease without esophagitis: Secondary | ICD-10-CM | POA: Diagnosis not present

## 2017-06-06 DIAGNOSIS — I10 Essential (primary) hypertension: Secondary | ICD-10-CM | POA: Diagnosis not present

## 2017-06-06 DIAGNOSIS — C189 Malignant neoplasm of colon, unspecified: Secondary | ICD-10-CM | POA: Diagnosis not present

## 2017-06-06 DIAGNOSIS — E119 Type 2 diabetes mellitus without complications: Secondary | ICD-10-CM | POA: Diagnosis not present

## 2017-06-06 DIAGNOSIS — K219 Gastro-esophageal reflux disease without esophagitis: Secondary | ICD-10-CM | POA: Diagnosis not present

## 2017-06-06 DIAGNOSIS — Z452 Encounter for adjustment and management of vascular access device: Secondary | ICD-10-CM | POA: Diagnosis not present

## 2017-06-06 DIAGNOSIS — E1142 Type 2 diabetes mellitus with diabetic polyneuropathy: Secondary | ICD-10-CM | POA: Diagnosis not present

## 2017-06-06 DIAGNOSIS — R160 Hepatomegaly, not elsewhere classified: Secondary | ICD-10-CM | POA: Diagnosis not present

## 2017-06-06 DIAGNOSIS — Z5111 Encounter for antineoplastic chemotherapy: Secondary | ICD-10-CM | POA: Diagnosis not present

## 2017-06-06 DIAGNOSIS — M13869 Other specified arthritis, unspecified knee: Secondary | ICD-10-CM | POA: Diagnosis not present

## 2017-06-11 DIAGNOSIS — C187 Malignant neoplasm of sigmoid colon: Secondary | ICD-10-CM | POA: Diagnosis not present

## 2017-06-11 DIAGNOSIS — C189 Malignant neoplasm of colon, unspecified: Secondary | ICD-10-CM | POA: Diagnosis not present

## 2017-06-18 DIAGNOSIS — C189 Malignant neoplasm of colon, unspecified: Secondary | ICD-10-CM | POA: Diagnosis not present

## 2017-06-24 DIAGNOSIS — M13869 Other specified arthritis, unspecified knee: Secondary | ICD-10-CM | POA: Diagnosis not present

## 2017-06-24 DIAGNOSIS — E119 Type 2 diabetes mellitus without complications: Secondary | ICD-10-CM | POA: Diagnosis not present

## 2017-06-24 DIAGNOSIS — R160 Hepatomegaly, not elsewhere classified: Secondary | ICD-10-CM | POA: Diagnosis not present

## 2017-06-24 DIAGNOSIS — Z452 Encounter for adjustment and management of vascular access device: Secondary | ICD-10-CM | POA: Diagnosis not present

## 2017-06-24 DIAGNOSIS — I10 Essential (primary) hypertension: Secondary | ICD-10-CM | POA: Diagnosis not present

## 2017-06-24 DIAGNOSIS — Z5111 Encounter for antineoplastic chemotherapy: Secondary | ICD-10-CM | POA: Diagnosis not present

## 2017-06-24 DIAGNOSIS — C189 Malignant neoplasm of colon, unspecified: Secondary | ICD-10-CM | POA: Diagnosis not present

## 2017-06-24 DIAGNOSIS — E1142 Type 2 diabetes mellitus with diabetic polyneuropathy: Secondary | ICD-10-CM | POA: Diagnosis not present

## 2017-06-24 DIAGNOSIS — K219 Gastro-esophageal reflux disease without esophagitis: Secondary | ICD-10-CM | POA: Diagnosis not present

## 2017-06-26 DIAGNOSIS — E119 Type 2 diabetes mellitus without complications: Secondary | ICD-10-CM | POA: Diagnosis not present

## 2017-06-26 DIAGNOSIS — C189 Malignant neoplasm of colon, unspecified: Secondary | ICD-10-CM | POA: Diagnosis not present

## 2017-06-26 DIAGNOSIS — Z452 Encounter for adjustment and management of vascular access device: Secondary | ICD-10-CM | POA: Diagnosis not present

## 2017-06-26 DIAGNOSIS — R160 Hepatomegaly, not elsewhere classified: Secondary | ICD-10-CM | POA: Diagnosis not present

## 2017-06-26 DIAGNOSIS — I10 Essential (primary) hypertension: Secondary | ICD-10-CM | POA: Diagnosis not present

## 2017-06-26 DIAGNOSIS — E1142 Type 2 diabetes mellitus with diabetic polyneuropathy: Secondary | ICD-10-CM | POA: Diagnosis not present

## 2017-06-26 DIAGNOSIS — M13869 Other specified arthritis, unspecified knee: Secondary | ICD-10-CM | POA: Diagnosis not present

## 2017-06-26 DIAGNOSIS — K219 Gastro-esophageal reflux disease without esophagitis: Secondary | ICD-10-CM | POA: Diagnosis not present

## 2017-06-26 DIAGNOSIS — Z5111 Encounter for antineoplastic chemotherapy: Secondary | ICD-10-CM | POA: Diagnosis not present

## 2017-07-01 DIAGNOSIS — Z809 Family history of malignant neoplasm, unspecified: Secondary | ICD-10-CM | POA: Diagnosis not present

## 2017-07-01 DIAGNOSIS — C189 Malignant neoplasm of colon, unspecified: Secondary | ICD-10-CM | POA: Diagnosis not present

## 2017-07-01 DIAGNOSIS — Z85118 Personal history of other malignant neoplasm of bronchus and lung: Secondary | ICD-10-CM | POA: Diagnosis not present

## 2017-07-01 DIAGNOSIS — C3490 Malignant neoplasm of unspecified part of unspecified bronchus or lung: Secondary | ICD-10-CM | POA: Diagnosis not present

## 2017-07-01 DIAGNOSIS — Z806 Family history of leukemia: Secondary | ICD-10-CM | POA: Diagnosis not present

## 2017-07-01 DIAGNOSIS — Z8500 Personal history of malignant neoplasm of unspecified digestive organ: Secondary | ICD-10-CM | POA: Diagnosis not present

## 2017-07-01 DIAGNOSIS — C4090 Malignant neoplasm of unspecified bones and articular cartilage of unspecified limb: Secondary | ICD-10-CM | POA: Diagnosis not present

## 2017-07-01 DIAGNOSIS — C220 Liver cell carcinoma: Secondary | ICD-10-CM | POA: Diagnosis not present

## 2017-07-01 DIAGNOSIS — C9590 Leukemia, unspecified not having achieved remission: Secondary | ICD-10-CM | POA: Diagnosis not present

## 2017-07-02 ENCOUNTER — Telehealth (HOSPITAL_COMMUNITY): Payer: Self-pay | Admitting: Hematology

## 2017-07-03 ENCOUNTER — Encounter (HOSPITAL_COMMUNITY): Payer: Self-pay | Admitting: Adult Health

## 2017-07-03 ENCOUNTER — Other Ambulatory Visit (HOSPITAL_COMMUNITY): Payer: Self-pay | Admitting: *Deleted

## 2017-07-03 ENCOUNTER — Other Ambulatory Visit (HOSPITAL_COMMUNITY): Payer: Self-pay | Admitting: Adult Health

## 2017-07-03 DIAGNOSIS — C189 Malignant neoplasm of colon, unspecified: Secondary | ICD-10-CM

## 2017-07-03 DIAGNOSIS — C787 Secondary malignant neoplasm of liver and intrahepatic bile duct: Principal | ICD-10-CM

## 2017-07-03 NOTE — Progress Notes (Signed)
Error in previous note.   Patient will be seen at the discretion of Dr. Delton Coombes with subsequent cycles of chemotherapy.    Mike Craze, NP [place] [phone]

## 2017-07-03 NOTE — Progress Notes (Signed)
Patient's records from [facility] in [place], [place] received and reviewed.  Patient is transferring his care to [facility].   Treatment plan for FOLFIRI created, with 20% dose-reduction in 5-FU and Irinotecan based on documentation from Dr. Tomie China last office visit there on 06/24/17.    Weight and BSA based off of recorded office visit documentation from 06/24/17: Weight 218.8 lbs; BSA 2.19 m2.    Patient will see Dr. Delton Coombes here at [facility] prior to upcoming next scheduled cycle of treatment.    Mike Craze, NP [place] [phone]

## 2017-07-04 ENCOUNTER — Other Ambulatory Visit (HOSPITAL_COMMUNITY): Payer: Self-pay | Admitting: Adult Health

## 2017-07-04 ENCOUNTER — Telehealth (HOSPITAL_COMMUNITY): Payer: Self-pay | Admitting: Hematology

## 2017-07-04 NOTE — Telephone Encounter (Signed)
Left vm for Christopher Friedman [phone] asking her to contact [facility] and discontinue pts current chemo auth.

## 2017-07-09 ENCOUNTER — Inpatient Hospital Stay (HOSPITAL_BASED_OUTPATIENT_CLINIC_OR_DEPARTMENT_OTHER): Payer: Medicare PPO | Admitting: Hematology

## 2017-07-09 ENCOUNTER — Encounter (HOSPITAL_COMMUNITY): Payer: Self-pay

## 2017-07-09 ENCOUNTER — Inpatient Hospital Stay (HOSPITAL_COMMUNITY): Payer: Medicare PPO | Attending: Internal Medicine

## 2017-07-09 ENCOUNTER — Ambulatory Visit (HOSPITAL_COMMUNITY): Payer: Medicare PPO

## 2017-07-09 ENCOUNTER — Other Ambulatory Visit: Payer: Self-pay

## 2017-07-09 VITALS — Ht 72.0 in

## 2017-07-09 VITALS — BP 133/76 | HR 68 | Temp 97.7°F | Resp 18 | Wt 213.4 lb

## 2017-07-09 DIAGNOSIS — Z5111 Encounter for antineoplastic chemotherapy: Secondary | ICD-10-CM | POA: Diagnosis not present

## 2017-07-09 DIAGNOSIS — C189 Malignant neoplasm of colon, unspecified: Secondary | ICD-10-CM

## 2017-07-09 DIAGNOSIS — Z7984 Long term (current) use of oral hypoglycemic drugs: Secondary | ICD-10-CM

## 2017-07-09 DIAGNOSIS — C7800 Secondary malignant neoplasm of unspecified lung: Secondary | ICD-10-CM | POA: Diagnosis not present

## 2017-07-09 DIAGNOSIS — Z794 Long term (current) use of insulin: Secondary | ICD-10-CM | POA: Insufficient documentation

## 2017-07-09 DIAGNOSIS — E139 Other specified diabetes mellitus without complications: Secondary | ICD-10-CM

## 2017-07-09 DIAGNOSIS — E119 Type 2 diabetes mellitus without complications: Secondary | ICD-10-CM | POA: Diagnosis not present

## 2017-07-09 DIAGNOSIS — C787 Secondary malignant neoplasm of liver and intrahepatic bile duct: Secondary | ICD-10-CM

## 2017-07-09 DIAGNOSIS — G620 Drug-induced polyneuropathy: Secondary | ICD-10-CM

## 2017-07-09 DIAGNOSIS — I10 Essential (primary) hypertension: Secondary | ICD-10-CM

## 2017-07-09 LAB — CBC WITH DIFFERENTIAL/PLATELET
Basophils Absolute: 0 10*3/uL (ref 0.0–0.1)
Basophils Relative: 1 %
EOS ABS: 0.1 10*3/uL (ref 0.0–0.7)
Eosinophils Relative: 1 %
HCT: 37.4 % — ABNORMAL LOW (ref 39.0–52.0)
HEMOGLOBIN: 12.2 g/dL — AB (ref 13.0–17.0)
LYMPHS ABS: 2.1 10*3/uL (ref 0.7–4.0)
LYMPHS PCT: 41 %
MCH: 28.8 pg (ref 26.0–34.0)
MCHC: 32.6 g/dL (ref 30.0–36.0)
MCV: 88.2 fL (ref 78.0–100.0)
MONOS PCT: 8 %
Monocytes Absolute: 0.4 10*3/uL (ref 0.1–1.0)
NEUTROS PCT: 49 %
Neutro Abs: 2.5 10*3/uL (ref 1.7–7.7)
Platelets: 178 10*3/uL (ref 150–400)
RBC: 4.24 MIL/uL (ref 4.22–5.81)
RDW: 15 % (ref 11.5–15.5)
WBC: 5.1 10*3/uL (ref 4.0–10.5)

## 2017-07-09 LAB — COMPREHENSIVE METABOLIC PANEL
ALT: 24 U/L (ref 17–63)
AST: 24 U/L (ref 15–41)
Albumin: 4.1 g/dL (ref 3.5–5.0)
Alkaline Phosphatase: 102 U/L (ref 38–126)
Anion gap: 13 (ref 5–15)
BUN: 21 mg/dL — AB (ref 6–20)
CO2: 25 mmol/L (ref 22–32)
CREATININE: 1.16 mg/dL (ref 0.61–1.24)
Calcium: 9.4 mg/dL (ref 8.9–10.3)
Chloride: 92 mmol/L — ABNORMAL LOW (ref 101–111)
Glucose, Bld: 416 mg/dL — ABNORMAL HIGH (ref 65–99)
POTASSIUM: 4 mmol/L (ref 3.5–5.1)
SODIUM: 130 mmol/L — AB (ref 135–145)
Total Bilirubin: 0.9 mg/dL (ref 0.3–1.2)
Total Protein: 7.3 g/dL (ref 6.5–8.1)

## 2017-07-09 MED ORDER — SODIUM CHLORIDE 0.9 % IV SOLN
400.0000 mg/m2 | Freq: Once | INTRAVENOUS | Status: DC
  Filled 2017-07-09: qty 43.8

## 2017-07-09 MED ORDER — DEXAMETHASONE SODIUM PHOSPHATE 10 MG/ML IJ SOLN
INTRAMUSCULAR | Status: AC
  Filled 2017-07-09: qty 1

## 2017-07-09 MED ORDER — ATROPINE SULFATE 1 MG/ML IJ SOLN
0.5000 mg | Freq: Once | INTRAMUSCULAR | Status: AC | PRN
  Administered 2017-07-09: 0.5 mg via INTRAVENOUS
  Filled 2017-07-09: qty 1

## 2017-07-09 MED ORDER — SODIUM CHLORIDE 0.9 % IV SOLN
144.0000 mg/m2 | Freq: Once | INTRAVENOUS | Status: AC
  Administered 2017-07-09: 320 mg via INTRAVENOUS
  Filled 2017-07-09: qty 14

## 2017-07-09 MED ORDER — PALONOSETRON HCL INJECTION 0.25 MG/5ML
0.2500 mg | Freq: Once | INTRAVENOUS | Status: AC
  Administered 2017-07-09: 0.25 mg via INTRAVENOUS

## 2017-07-09 MED ORDER — DEXAMETHASONE SODIUM PHOSPHATE 10 MG/ML IJ SOLN
10.0000 mg | Freq: Once | INTRAMUSCULAR | Status: AC
  Administered 2017-07-09: 10 mg via INTRAVENOUS

## 2017-07-09 MED ORDER — INSULIN ASPART 100 UNIT/ML ~~LOC~~ SOLN
10.0000 [IU] | Freq: Once | SUBCUTANEOUS | Status: AC
  Administered 2017-07-09: 10 [IU] via SUBCUTANEOUS
  Filled 2017-07-09: qty 0.1

## 2017-07-09 MED ORDER — PALONOSETRON HCL INJECTION 0.25 MG/5ML
INTRAVENOUS | Status: AC
  Filled 2017-07-09: qty 5

## 2017-07-09 MED ORDER — SODIUM CHLORIDE 0.9 % IV SOLN
1920.0000 mg/m2 | INTRAVENOUS | Status: DC
  Administered 2017-07-09: 4200 mg via INTRAVENOUS
  Filled 2017-07-09: qty 84

## 2017-07-09 MED ORDER — SODIUM CHLORIDE 0.9% FLUSH
10.0000 mL | INTRAVENOUS | Status: DC | PRN
Start: 2017-07-09 — End: 2017-07-09
  Administered 2017-07-09: 10 mL
  Filled 2017-07-09: qty 10

## 2017-07-09 MED ORDER — SODIUM CHLORIDE 0.9 % IV SOLN
400.0000 mg/m2 | Freq: Once | INTRAVENOUS | Status: AC
  Administered 2017-07-09: 876 mg via INTRAVENOUS
  Filled 2017-07-09: qty 43.8

## 2017-07-09 MED ORDER — FLUOROURACIL CHEMO INJECTION 2.5 GM/50ML
320.0000 mg/m2 | Freq: Once | INTRAVENOUS | Status: AC
  Administered 2017-07-09: 700 mg via INTRAVENOUS
  Filled 2017-07-09: qty 14

## 2017-07-09 MED ORDER — SODIUM CHLORIDE 0.9 % IV SOLN: 10.0000 mg | Freq: Once | INTRAVENOUS | Status: DC

## 2017-07-09 MED ORDER — SODIUM CHLORIDE 0.9 % IV SOLN
Freq: Once | INTRAVENOUS | Status: AC
  Administered 2017-07-09: 10:00:00 via INTRAVENOUS

## 2017-07-09 NOTE — Patient Instructions (Signed)
[facility] Discharge Instructions for Patients Receiving Chemotherapy   Beginning January 23rd 2017 lab work for the Cancer Center will be done in the  Main lab at [place] on 1st floor. If you have a lab appointment with the Cancer Center please come in thru the  Main Entrance and check in at the main information desk   Today you received the following chemotherapy agents   To help prevent nausea and vomiting after your treatment, we encourage you to take your nausea medication     If you develop nausea and vomiting, or diarrhea that is not controlled by your medication, call the clinic.  The clinic phone number is [phone]. Office hours are Monday-Friday 8:30am-5:00pm.  BELOW ARE SYMPTOMS THAT SHOULD BE REPORTED IMMEDIATELY:  *FEVER GREATER THAN 101.0 F  *CHILLS WITH OR WITHOUT FEVER  NAUSEA AND VOMITING THAT IS NOT CONTROLLED WITH YOUR NAUSEA MEDICATION  *UNUSUAL SHORTNESS OF BREATH  *UNUSUAL BRUISING OR BLEEDING  TENDERNESS IN MOUTH AND THROAT WITH OR WITHOUT PRESENCE OF ULCERS  *URINARY PROBLEMS  *BOWEL PROBLEMS  UNUSUAL RASH Items with * indicate a potential emergency and should be followed up as soon as possible. If you have an emergency after office hours please contact your primary care physician or go to the nearest emergency department.  Please call the clinic during office hours if you have any questions or concerns.   You may also contact the Patient Navigator at [phone] should you have any questions or need assistance in obtaining follow up care.      Resources For Cancer Patients and their Caregivers ? American Cancer Society: Can assist with transportation, wigs, general needs, runs Look Good Feel Better.        1-888-227-6333 ? Cancer Care: Provides financial assistance, online support groups, medication/co-pay assistance.  1-800-813-HOPE (4673) ? [facility] Assists [place] cancer  patients and their families through emotional , educational and financial support.  336-427-4357 ? [place] DSS Where to apply for food stamps, Medicaid and utility assistance. 336-342-1394 ? RCATS: Transportation to medical appointments. 336-347-2287 ? Social Security Administration: May apply for disability if have a Stage IV cancer. 336-342-7796 1-800-772-1213 ? [place] Aging, Disability and Transit Services: Assists with nutrition, care and transit needs. 336-349-2343         

## 2017-07-09 NOTE — Progress Notes (Signed)
[place], [place] [address]   CLINIC:  Medical Oncology/Hematology  PCP:  [name], MD [place] [phone]   REASON FOR VISIT:  Follow-up for chemotherapy.  CURRENT THERAPY: Patient here for cycle 4 of FOLFIRI.  BRIEF ONCOLOGIC HISTORY:   No history exists.  1. 12 cycles of FOLFIRI from 08/30/2015 through 02/12/2016, maintenance 5-FU and leucovorin from 02/27/2016 through 03/26/2016, chemotherapy discontinued due to patient preference 2.  Bevacizumab held after cycle 5 secondary to GI bleed 3.  CT scan on 05/13/2017 shows a 7.7 cm mass in the left lobe of the liver, 9.6 mm nodule at the left lung base consistent with recurrence     INTERVAL HISTORY:  Christopher Friedman 67 y.o. male returns for routine follow-up and consideration for next cycle of chemotherapy.   Due for cycle #cycle 4  today.   Overall, he tells me he has been feeling pretty well. Energy levels have decreased for 2-3 days after the last cycle of chemotherapy.  However he continued to work part-time.; appetite has been great.  Denies any nausea, vomiting or abdominal cramping.  He takes Imodium for diarrhea as needed.   Overall, he feels ready for next cycle of chemo today.     REVIEW OF SYSTEMS:  Review of Systems  Constitutional: Positive for fatigue. Negative for appetite change.  HENT:   Positive for mouth sores.   Cardiovascular: Negative.   Gastrointestinal: Positive for diarrhea. Negative for abdominal pain, constipation, nausea and vomiting.  Musculoskeletal: Negative.   Skin: Negative.   Hematological: Negative.      PAST MEDICAL/SURGICAL HISTORY:  Past Medical History:  Diagnosis Date  . Diabetes [facility])    Past Surgical History:  Procedure Laterality Date  . SHOULDER SURGERY  2003     SOCIAL HISTORY:  Social History   Socioeconomic History  . Marital status: Unknown    Spouse name: Not on file  . Number of children:  Not on file  . Years of education: Not on file  . Highest education level: Not on file  Social Needs  . Financial resource strain: Not on file  . Food insecurity - worry: Not on file  . Food insecurity - inability: Not on file  . Transportation needs - medical: Not on file  . Transportation needs - non-medical: Not on file  Occupational History  . Not on file  Tobacco Use  . Smoking status: Former Smoker    Last attempt to quit: 04/05/1995    Years since quitting: 22.2  Substance and Sexual Activity  . Alcohol use: Not on file  . Drug use: Not on file  . Sexual activity: Not on file  Other Topics Concern  . Not on file  Social History Narrative  . Not on file    FAMILY HISTORY:  Family History  Problem Relation Age of Onset  . Colon cancer Neg Hx   . Colon polyps Neg Hx     CURRENT MEDICATIONS:  Outpatient Encounter Medications as of 07/09/2017  Medication Sig Note  . Ascorbic Acid (VITAMIN C) 1000 MG tablet Take 1,000 mg by mouth daily.   Marland Kitchen aspirin 81 MG tablet Take 81 mg by mouth daily.   Marland Kitchen BEE POLLEN PO Take by mouth.   . Blood Glucose Monitoring Suppl (ONE TOUCH ULTRA MINI) W/DEVICE KIT USE TO CHECK BLOOD SUGAR DAILY. 04/05/2015: Received from: External Pharmacy  . Coenzyme Q10 (CO Q 10 PO)  Take by mouth.   . erythromycin (E-MYCIN) 250 MG tablet TAKE TWO (2) TABLETS BY MOUTH AT 100 PM, 200 PM, AND 1000 PM PRIOR TO SURGERY 04/05/2015: Received from: External Pharmacy  . Glucosamine-Chondroit-Collagen ([facility] GLUCO-CHONDROIT PLUS UC-II PO) Take by mouth.   . metFORMIN (GLUCOPHAGE) 500 MG tablet TAKE ONE TABLET BY MOUTH ONCE DAILY FOR ONE WEEK. IF NO DIARRHEA INCREASE TO TWICE DAILY   . omeprazole (PRILOSEC) 20 MG capsule 1 PO 30 mins prior to breakfast   . valsartan-hydrochlorothiazide (DIOVAN-HCT) 160-25 MG tablet Take 1 tablet by mouth daily.    No facility-administered encounter medications on file as of 07/09/2017.     ALLERGIES:  No Known Allergies   PHYSICAL EXAM:   ECOG Performance status: 1  There were no vitals filed for this visit. There were no vitals filed for this visit.  Physical Exam  Constitutional: He is oriented to person, place, and time and well-developed, well-nourished, and in no distress.  Cardiovascular: Normal rate and regular rhythm.  Pulmonary/Chest: Effort normal and breath sounds normal.  Abdominal: Soft. He exhibits no mass. There is no tenderness.  Musculoskeletal: He exhibits no edema.  Neurological: He is alert and oriented to person, place, and time.     LABORATORY DATA:  I have reviewed the labs as listed.  CBC    Component Value Date/Time   WBC 5.1 07/09/2017 0850   RBC 4.24 07/09/2017 0850   HGB 12.2 (L) 07/09/2017 0850   HCT 37.4 (L) 07/09/2017 0850   PLT 178 07/09/2017 0850   MCV 88.2 07/09/2017 0850   MCH 28.8 07/09/2017 0850   MCHC 32.6 07/09/2017 0850   RDW 15.0 07/09/2017 0850   LYMPHSABS 2.1 07/09/2017 0850   MONOABS 0.4 07/09/2017 0850   EOSABS 0.1 07/09/2017 0850   BASOSABS 0.0 07/09/2017 0850   CMP Latest Ref Rng & Units 07/09/2017  Glucose 65 - 99 mg/dL 416(H)  BUN 6 - 20 mg/dL 21(H)  Creatinine 0.61 - 1.24 mg/dL 1.16  Sodium 135 - 145 mmol/L 130(L)  Potassium 3.5 - 5.1 mmol/L 4.0  Chloride 101 - 111 mmol/L 92(L)  CO2 22 - 32 mmol/L 25  Calcium 8.9 - 10.3 mg/dL 9.4  Total Protein 6.5 - 8.1 g/dL 7.3  Total Bilirubin 0.3 - 1.2 mg/dL 0.9  Alkaline Phos 38 - 126 U/L 102  AST 15 - 41 U/L 24  ALT 17 - 63 U/L 24    PENDING LABS:        ASSESSMENT & PLAN:   #1 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  His 5-FU, and CPT-11 were already dose reduced by 20%.  He has slight diarrhea for which he takes Imodium.  I plan to repeat CT scans after 6 cycles.  We will continue to monitor his CEA levels.  2.  Diabetes: He continues  to take metformin 500 mg twice daily.  His blood sugars were slightly elevated today.  He has received 10 units of NovoLog subcu.  3.  Peripheral neuropathy: He has some numbness in the feet which is predominantly at bedtime and stable.  4.  Hypertension: He is taking valsartan HCTZ (1 60-25) daily (given by Dr. Quillian Quince in [place]).         Derek Jack MD [place] [phone]

## 2017-07-09 NOTE — Progress Notes (Signed)
Patient seen today by Dr. Delton Coombes. Labs reviewed and will proceed with treatment.   Continuous pump consent signed, and administered per protocol.   Treatment given per orders. Patient tolerated it well without problems. Vitals stable and discharged home from clinic ambulatory. Follow up as scheduled.

## 2017-07-10 LAB — CEA: CEA: 24 ng/mL — ABNORMAL HIGH (ref 0.0–4.7)

## 2017-07-11 ENCOUNTER — Inpatient Hospital Stay (HOSPITAL_COMMUNITY): Payer: Medicare PPO

## 2017-07-11 ENCOUNTER — Other Ambulatory Visit: Payer: Self-pay

## 2017-07-11 ENCOUNTER — Encounter (HOSPITAL_COMMUNITY): Payer: Self-pay

## 2017-07-11 VITALS — BP 134/75 | HR 70 | Temp 97.5°F | Resp 18

## 2017-07-11 DIAGNOSIS — E119 Type 2 diabetes mellitus without complications: Secondary | ICD-10-CM | POA: Diagnosis not present

## 2017-07-11 DIAGNOSIS — Z794 Long term (current) use of insulin: Secondary | ICD-10-CM | POA: Diagnosis not present

## 2017-07-11 DIAGNOSIS — C189 Malignant neoplasm of colon, unspecified: Secondary | ICD-10-CM

## 2017-07-11 DIAGNOSIS — C7800 Secondary malignant neoplasm of unspecified lung: Secondary | ICD-10-CM | POA: Diagnosis not present

## 2017-07-11 DIAGNOSIS — C787 Secondary malignant neoplasm of liver and intrahepatic bile duct: Principal | ICD-10-CM

## 2017-07-11 DIAGNOSIS — Z5111 Encounter for antineoplastic chemotherapy: Secondary | ICD-10-CM | POA: Diagnosis not present

## 2017-07-11 MED ORDER — SODIUM CHLORIDE 0.9% FLUSH
10.0000 mL | INTRAVENOUS | Status: DC | PRN
  Administered 2017-07-11: 10 mL
  Filled 2017-07-11: qty 10

## 2017-07-11 MED ORDER — HEPARIN SOD (PORK) LOCK FLUSH 100 UNIT/ML IV SOLN
500.0000 [IU] | Freq: Once | INTRAVENOUS | Status: AC | PRN
  Administered 2017-07-11: 500 [IU]
  Filled 2017-07-11: qty 5

## 2017-07-11 NOTE — Progress Notes (Signed)
Christopher Friedman presents to have home infusion pump d/c'd and for port-a-cath deaccess with flush.  Portacath located right chest wall accessed with  H 20 needle.  Good blood return present. Portacath flushed with NS and 500U/58ml Heparin, and needle removed intact.  Procedure tolerated well and without incident.  Discharged ambulatory.

## 2017-07-14 DIAGNOSIS — C189 Malignant neoplasm of colon, unspecified: Secondary | ICD-10-CM | POA: Diagnosis not present

## 2017-07-22 DIAGNOSIS — E782 Mixed hyperlipidemia: Secondary | ICD-10-CM | POA: Diagnosis not present

## 2017-07-22 DIAGNOSIS — Z6828 Body mass index (BMI) 28.0-28.9, adult: Secondary | ICD-10-CM | POA: Diagnosis not present

## 2017-07-22 DIAGNOSIS — E1165 Type 2 diabetes mellitus with hyperglycemia: Secondary | ICD-10-CM | POA: Diagnosis not present

## 2017-07-22 DIAGNOSIS — E8881 Metabolic syndrome: Secondary | ICD-10-CM | POA: Diagnosis not present

## 2017-07-22 DIAGNOSIS — C187 Malignant neoplasm of sigmoid colon: Secondary | ICD-10-CM | POA: Diagnosis not present

## 2017-07-23 ENCOUNTER — Ambulatory Visit (HOSPITAL_COMMUNITY): Payer: Medicare PPO | Admitting: Hematology

## 2017-07-23 ENCOUNTER — Inpatient Hospital Stay (HOSPITAL_COMMUNITY): Payer: Medicare PPO

## 2017-07-23 ENCOUNTER — Encounter (HOSPITAL_COMMUNITY): Payer: Self-pay | Admitting: Hematology

## 2017-07-23 ENCOUNTER — Inpatient Hospital Stay (HOSPITAL_BASED_OUTPATIENT_CLINIC_OR_DEPARTMENT_OTHER): Payer: Medicare PPO | Admitting: Hematology

## 2017-07-23 VITALS — BP 138/63 | HR 60 | Temp 97.4°F | Resp 18

## 2017-07-23 DIAGNOSIS — C189 Malignant neoplasm of colon, unspecified: Secondary | ICD-10-CM

## 2017-07-23 DIAGNOSIS — I10 Essential (primary) hypertension: Secondary | ICD-10-CM

## 2017-07-23 DIAGNOSIS — E119 Type 2 diabetes mellitus without complications: Secondary | ICD-10-CM | POA: Diagnosis not present

## 2017-07-23 DIAGNOSIS — C787 Secondary malignant neoplasm of liver and intrahepatic bile duct: Secondary | ICD-10-CM

## 2017-07-23 DIAGNOSIS — G620 Drug-induced polyneuropathy: Secondary | ICD-10-CM | POA: Diagnosis not present

## 2017-07-23 DIAGNOSIS — Z7984 Long term (current) use of oral hypoglycemic drugs: Secondary | ICD-10-CM | POA: Diagnosis not present

## 2017-07-23 DIAGNOSIS — Z794 Long term (current) use of insulin: Secondary | ICD-10-CM | POA: Diagnosis not present

## 2017-07-23 DIAGNOSIS — Z5111 Encounter for antineoplastic chemotherapy: Secondary | ICD-10-CM | POA: Diagnosis not present

## 2017-07-23 DIAGNOSIS — C7800 Secondary malignant neoplasm of unspecified lung: Secondary | ICD-10-CM | POA: Diagnosis not present

## 2017-07-23 LAB — CBC WITH DIFFERENTIAL/PLATELET
BASOS ABS: 0 10*3/uL (ref 0.0–0.1)
BASOS PCT: 1 %
EOS PCT: 1 %
Eosinophils Absolute: 0.1 10*3/uL (ref 0.0–0.7)
HCT: 37 % — ABNORMAL LOW (ref 39.0–52.0)
Hemoglobin: 12.2 g/dL — ABNORMAL LOW (ref 13.0–17.0)
Lymphocytes Relative: 31 %
Lymphs Abs: 1.8 10*3/uL (ref 0.7–4.0)
MCH: 29.6 pg (ref 26.0–34.0)
MCHC: 33 g/dL (ref 30.0–36.0)
MCV: 89.8 fL (ref 78.0–100.0)
MONO ABS: 0.3 10*3/uL (ref 0.1–1.0)
Monocytes Relative: 5 %
NEUTROS ABS: 3.5 10*3/uL (ref 1.7–7.7)
Neutrophils Relative %: 62 %
PLATELETS: 185 10*3/uL (ref 150–400)
RBC: 4.12 MIL/uL — AB (ref 4.22–5.81)
RDW: 15.7 % — AB (ref 11.5–15.5)
WBC: 5.6 10*3/uL (ref 4.0–10.5)

## 2017-07-23 LAB — COMPREHENSIVE METABOLIC PANEL
ALBUMIN: 4.1 g/dL (ref 3.5–5.0)
ALK PHOS: 106 U/L (ref 38–126)
ALT: 23 U/L (ref 17–63)
ANION GAP: 13 (ref 5–15)
AST: 23 U/L (ref 15–41)
BILIRUBIN TOTAL: 1 mg/dL (ref 0.3–1.2)
BUN: 16 mg/dL (ref 6–20)
CALCIUM: 9.9 mg/dL (ref 8.9–10.3)
CO2: 25 mmol/L (ref 22–32)
Chloride: 92 mmol/L — ABNORMAL LOW (ref 101–111)
Creatinine, Ser: 1.1 mg/dL (ref 0.61–1.24)
GFR calc Af Amer: >60 mL/min (ref >60)
GFR calc non Af Amer: >60 mL/min (ref >60)
GLUCOSE: 493 mg/dL — AB (ref 65–99)
Potassium: 4 mmol/L (ref 3.5–5.1)
Sodium: 130 mmol/L — ABNORMAL LOW (ref 135–145)
TOTAL PROTEIN: 7.3 g/dL (ref 6.5–8.1)

## 2017-07-23 MED ORDER — ATROPINE SULFATE 1 MG/ML IJ SOLN
0.5000 mg | Freq: Once | INTRAMUSCULAR | Status: AC
  Administered 2017-07-23: 0.5 mg via INTRAVENOUS
  Filled 2017-07-23: qty 1

## 2017-07-23 MED ORDER — FLUOROURACIL CHEMO INJECTION 2.5 GM/50ML
320.0000 mg/m2 | Freq: Once | INTRAVENOUS | Status: AC
  Administered 2017-07-23: 700 mg via INTRAVENOUS
  Filled 2017-07-23: qty 14

## 2017-07-23 MED ORDER — SODIUM CHLORIDE 0.9 % IV SOLN
900.0000 mg | Freq: Once | INTRAVENOUS | Status: AC
  Administered 2017-07-23: 900 mg via INTRAVENOUS
  Filled 2017-07-23: qty 10

## 2017-07-23 MED ORDER — SODIUM CHLORIDE 0.9 % IV SOLN
Freq: Once | INTRAVENOUS | Status: AC
  Administered 2017-07-23: 11:00:00 via INTRAVENOUS

## 2017-07-23 MED ORDER — DEXAMETHASONE SODIUM PHOSPHATE 100 MG/10ML IJ SOLN: 10.0000 mg | Freq: Once | INTRAMUSCULAR | Status: DC

## 2017-07-23 MED ORDER — INSULIN ASPART 100 UNIT/ML ~~LOC~~ SOLN
10.0000 [IU] | Freq: Once | SUBCUTANEOUS | Status: AC
  Administered 2017-07-23: 10 [IU] via SUBCUTANEOUS
  Filled 2017-07-23: qty 0.1

## 2017-07-23 MED ORDER — SODIUM CHLORIDE 0.9 % IV SOLN
144.0000 mg/m2 | Freq: Once | INTRAVENOUS | Status: AC
  Administered 2017-07-23: 320 mg via INTRAVENOUS
  Filled 2017-07-23: qty 15

## 2017-07-23 MED ORDER — DEXAMETHASONE SODIUM PHOSPHATE 10 MG/ML IJ SOLN
10.0000 mg | Freq: Once | INTRAMUSCULAR | Status: AC
  Administered 2017-07-23: 10 mg via INTRAVENOUS

## 2017-07-23 MED ORDER — SODIUM CHLORIDE 0.9 % IV SOLN
1920.0000 mg/m2 | INTRAVENOUS | Status: DC
  Administered 2017-07-23: 4200 mg via INTRAVENOUS
  Filled 2017-07-23: qty 84

## 2017-07-23 MED ORDER — PALONOSETRON HCL INJECTION 0.25 MG/5ML
0.2500 mg | Freq: Once | INTRAVENOUS | Status: AC
  Administered 2017-07-23: 0.25 mg via INTRAVENOUS

## 2017-07-23 NOTE — Assessment & Plan Note (Signed)
1.  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  He has mild fatigue and one mouth sore and had some trouble sleeping.  Today his blood counts are adequate to proceed with cycle 5.  His CEA from last visit has come down to 24.  I plan to repeat his scans after cycle 6 to evaluate response.  His 5-FU and CPT-11 doses were already reduced by 20%.  He will continue Imodium as needed for diarrhea.  2.  Diabetes: He was taking metformin 500 mg twice daily.  Hemoglobin A1c by his PMD was high at 12.1.  He was reportedly started on glipizide, patient does not remember the dose.  He was reportedly also started on atorvastatin.  3.  Peripheral neuropathy: Numbness in the feet, predominantly at bedtime is stable.  4.  Hypertension: He will continue valsartan HCTZ 160-25mg daily. 

## 2017-07-23 NOTE — Patient Instructions (Signed)
[facility] at [facility] Discharge Instructions  You saw Dr. K today.   Thank you for choosing [facility] at [facility] to provide your oncology and hematology care.  To afford each patient quality time with our provider, please arrive at least 15 minutes before your scheduled appointment time.   If you have a lab appointment with the Cancer Center please come in thru the  Main Entrance and check in at the main information desk  You need to re-schedule your appointment should you arrive 10 or more minutes late.  We strive to give you quality time with our providers, and arriving late affects you and other patients whose appointments are after yours.  Also, if you no show three or more times for appointments you may be dismissed from the clinic at the providers discretion.     Again, thank you for choosing [facility].  Our hope is that these requests will decrease the amount of time that you wait before being seen by our physicians.       _____________________________________________________________  Should you have questions after your visit to  Cancer Center, please contact our office at [phone] between the hours of 8:30 a.m. and 4:30 p.m.  Voicemails left after 4:30 p.m. will not be returned until the following business day.  For prescription refill requests, have your pharmacy contact our office.       Resources For Cancer Patients and their Caregivers ? American Cancer Society: Can assist with transportation, wigs, general needs, runs Look Good Feel Better.        1-888-227-6333 ? Cancer Care: Provides financial assistance, online support groups, medication/co-pay assistance.  1-800-813-HOPE (4673) ? [facility] Assists [place] cancer patients and their families through emotional , educational and financial support.  336-427-4357 ? [place] DSS Where to apply for food  stamps, Medicaid and utility assistance. 336-342-1394 ? RCATS: Transportation to medical appointments. 336-347-2287 ? Social Security Administration: May apply for disability if have a Stage IV cancer. 336-342-7796 1-800-772-1213 ? [place] Aging, Disability and Transit Services: Assists with nutrition, care and transit needs. 336-349-2343  Cancer Center Support Programs:   > Cancer Support Group  2nd Tuesday of the month 1pm-2pm, Journey Room   > Creative Journey  3rd Tuesday of the month 1130am-1pm, Journey Room     

## 2017-07-23 NOTE — Progress Notes (Signed)
[place], [place] [address]   CLINIC:  Medical Oncology/Hematology  PCP:  [name], MD [place] [id number] [phone]   REASON FOR VISIT:  Follow-up for metastatic colon cancer.  CURRENT THERAPY: Cycle 5 of FOLFIRI.  BRIEF ONCOLOGIC HISTORY:  1. 12 cycles of FOLFIRI from 08/30/2015 through 02/12/2016, maintenance 5-FU and leucovorin from 02/27/2016 through 03/26/2016, chemotherapy discontinued due to patient preference 2.  Bevacizumab held after cycle 5 secondary to GI bleed 3.  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 male returns for cycle of chemotherapy.  He had mild constipation after last cycle.  He had one mouth sore.  His fatigue is stable.  He has some trouble sleeping.  His neuropathy in the feet is also stable.  He was reportedly started on new diabetes medicine and new cholesterol pill.  He is continuing to work part-time.  REVIEW OF SYSTEMS:  Review of Systems  Constitutional: Positive for fatigue.  HENT:  Negative.   Respiratory: Negative.   Cardiovascular: Negative.   Gastrointestinal: Positive for constipation.  Genitourinary: Negative.    Musculoskeletal: Negative.   Skin: Negative.   Neurological: Positive for numbness.  Hematological: Negative.   Psychiatric/Behavioral: Negative.      PAST MEDICAL/SURGICAL HISTORY:  Past Medical History:  Diagnosis Date  . Diabetes [facility]    Past Surgical History:  Procedure Laterality Date  . SHOULDER SURGERY  2003     SOCIAL HISTORY:  Social History   Socioeconomic History  . Marital  status: Unknown    Spouse name: Not on file  . Number of children: Not on file  . Years of education: Not on file  . Highest education level: Not on file  Occupational History  . Not on file  Social Needs  . Financial resource strain: Not on file  . Food insecurity:    Worry: Not on file    Inability: Not on file  . Transportation needs:    Medical: Not on file    Non-medical: Not on file  Tobacco Use  . Smoking status: Former Smoker    Last attempt to quit: 04/05/1995    Years since quitting: 22.3  . Smokeless tobacco: Never Used  Substance and Sexual Activity  . Alcohol use: Not on file  . Drug use: Not on file  . Sexual activity: Not on file  Lifestyle  . Physical activity:    Days per week: Not on file    Minutes per session: Not on file  . Stress: Not on file  Relationships  . Social connections:    Talks on phone: Not on file    Gets together: Not on file    Attends religious service: Not on file    Active member of club or organization: Not on file    Attends meetings of clubs or organizations: Not on file    Relationship status: Not on file  . Intimate partner violence:    Fear of current or ex partner: Not on file    Emotionally abused: Not on file  Physically abused: Not on file    Forced sexual activity: Not on file  Other Topics Concern  . Not on file  Social History Narrative  . Not on file    FAMILY HISTORY:  Family History  Problem Relation Age of Onset  . Colon cancer Neg Hx   . Colon polyps Neg Hx     CURRENT MEDICATIONS:  Outpatient Encounter Medications as of 07/23/2017  Medication Sig Note  . Ascorbic Acid (VITAMIN C) 1000 MG tablet Take 1,000 mg by mouth daily.   Marland Kitchen aspirin 81 MG tablet Take 81 mg by mouth daily.   Marland Kitchen atorvastatin (LIPITOR) 10 MG tablet    . BEE POLLEN PO Take by mouth.   . Blood Glucose Monitoring Suppl (ONE TOUCH ULTRA MINI) W/DEVICE KIT USE TO CHECK BLOOD SUGAR DAILY. 04/05/2015: Received from: External Pharmacy  .  Coenzyme Q10 (CO Q 10 PO) Take by mouth.   . erythromycin (E-MYCIN) 250 MG tablet TAKE TWO (2) TABLETS BY MOUTH AT 100 PM, 200 PM, AND 1000 PM PRIOR TO SURGERY 04/05/2015: Received from: External Pharmacy  . glipiZIDE (GLUCOTROL XL) 10 MG 24 hr tablet    . Glucosamine-Chondroit-Collagen ([employer] GLUCO-CHONDROIT PLUS UC-II PO) Take by mouth.   . magnesium oxide (MAG-OX) 400 MG tablet Take 400 mg by mouth 2 (two) times daily.   . metFORMIN (GLUCOPHAGE) 500 MG tablet TAKE ONE TABLET BY MOUTH ONCE DAILY FOR ONE WEEK. IF NO DIARRHEA INCREASE TO TWICE DAILY   . omeprazole (PRILOSEC) 20 MG capsule 1 PO 30 mins prior to breakfast   . valsartan-hydrochlorothiazide (DIOVAN-HCT) 160-25 MG tablet Take 1 tablet by mouth daily.    Facility-Administered Encounter Medications as of 07/23/2017  Medication  . [COMPLETED] insulin aspart (novoLOG) injection 10 Units    ALLERGIES:  No Known Allergies   PHYSICAL EXAM:  ECOG Performance status: 1  Vitals:   07/23/17 0914  BP: 123/84  Pulse: 74  Resp: 16  Temp: 98 F (36.7 C)  SpO2: 99%   Filed Weights   07/23/17 0914  Weight: 211 lb 3.2 oz (95.8 kg)      LABORATORY DATA:  I have reviewed the labs as listed.  CBC    Component Value Date/Time   WBC 5.6 07/23/2017 0904   RBC 4.12 (L) 07/23/2017 0904   HGB 12.2 (L) 07/23/2017 0904   HCT 37.0 (L) 07/23/2017 0904   PLT 185 07/23/2017 0904   MCV 89.8 07/23/2017 0904   MCH 29.6 07/23/2017 0904   MCHC 33.0 07/23/2017 0904   RDW 15.7 (H) 07/23/2017 0904   LYMPHSABS 1.8 07/23/2017 0904   MONOABS 0.3 07/23/2017 0904   EOSABS 0.1 07/23/2017 0904   BASOSABS 0.0 07/23/2017 0904   CMP Latest Ref Rng & Units 07/23/2017 07/09/2017  Glucose 65 - 99 mg/dL 493(H) 416(H)  BUN 6 - 20 mg/dL 16 21(H)  Creatinine 0.61 - 1.24 mg/dL 1.10 1.16  Sodium 135 - 145 mmol/L 130(L) 130(L)  Potassium 3.5 - 5.1 mmol/L 4.0 4.0  Chloride 101 - 111 mmol/L 92(L) 92(L)  CO2 22 - 32 mmol/L 25 25  Calcium 8.9 - 10.3 mg/dL 9.9  9.4  Total Protein 6.5 - 8.1 g/dL 7.3 7.3  Total Bilirubin 0.3 - 1.2 mg/dL 1.0 0.9  Alkaline Phos 38 - 126 U/L 106 102  AST 15 - 41 U/L 23 24  ALT 17 - 63 U/L 23 24          ASSESSMENT & PLAN:   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  He has mild fatigue and one mouth sore and had some trouble sleeping.  Today his blood counts are adequate to proceed with cycle 5.  His CEA from last visit has come down to 24.  I plan to repeat his scans after cycle 6 to evaluate response.  His 5-FU and CPT-11 doses were already reduced by 20%.  He will continue Imodium as needed for diarrhea.  2.  Diabetes: He was taking metformin 500 mg twice daily.  Hemoglobin A1c by his PMD was high at 12.1.  He was reportedly started on glipizide, patient does not remember the dose.  He was reportedly also started on atorvastatin.  3.  Peripheral neuropathy: Numbness in the feet, predominantly at bedtime is stable.  4.  Hypertension: He will continue valsartan HCTZ 160-55m daily.  Blood sugar was still high at 493 today.  He received some NovoLog.    Orders placed this encounter:  No orders of the defined types were placed in this encounter.     SDerek Jack MD [id number] [phone]

## 2017-07-23 NOTE — Progress Notes (Signed)
Tolerated infusions w/o adverse reaction.  Alert, in no distress.  VSS.  Discharged ambulatory with home infusion pump infusing as ordered.  

## 2017-07-24 LAB — CEA: CEA1: 15.2 ng/mL — AB (ref 0.0–4.7)

## 2017-07-25 ENCOUNTER — Inpatient Hospital Stay (HOSPITAL_COMMUNITY): Payer: Medicare PPO

## 2017-07-25 VITALS — BP 117/80 | HR 80 | Temp 98.6°F | Resp 16

## 2017-07-25 DIAGNOSIS — C787 Secondary malignant neoplasm of liver and intrahepatic bile duct: Principal | ICD-10-CM

## 2017-07-25 DIAGNOSIS — C7800 Secondary malignant neoplasm of unspecified lung: Secondary | ICD-10-CM | POA: Diagnosis not present

## 2017-07-25 DIAGNOSIS — E119 Type 2 diabetes mellitus without complications: Secondary | ICD-10-CM | POA: Diagnosis not present

## 2017-07-25 DIAGNOSIS — C189 Malignant neoplasm of colon, unspecified: Secondary | ICD-10-CM

## 2017-07-25 DIAGNOSIS — Z794 Long term (current) use of insulin: Secondary | ICD-10-CM | POA: Diagnosis not present

## 2017-07-25 DIAGNOSIS — Z5111 Encounter for antineoplastic chemotherapy: Secondary | ICD-10-CM | POA: Diagnosis not present

## 2017-07-25 MED ORDER — SODIUM CHLORIDE 0.9% FLUSH
10.0000 mL | INTRAVENOUS | Status: DC | PRN
  Administered 2017-07-25: 10 mL
  Filled 2017-07-25: qty 10

## 2017-07-25 MED ORDER — HEPARIN SOD (PORK) LOCK FLUSH 100 UNIT/ML IV SOLN
500.0000 [IU] | Freq: Once | INTRAVENOUS | Status: AC | PRN
  Administered 2017-07-25: 500 [IU]

## 2017-07-25 NOTE — Patient Instructions (Signed)
[place] at [facility] Discharge Instructions  Continuous pump disconnected today Follow up as scheduled.   Thank you for choosing [place] at [facility] to provide your oncology and hematology care.  To afford each patient quality time with our provider, please arrive at least 15 minutes before your scheduled appointment time.   If you have a lab appointment with the [place] please come in thru the  Main Entrance and check in at the main information desk  You need to re-schedule your appointment should you arrive 10 or more minutes late.  We strive to give you quality time with our providers, and arriving late affects you and other patients whose appointments are after yours.  Also, if you no show three or more times for appointments you may be dismissed from the clinic at the providers discretion.     Again, thank you for choosing [facility].  Our hope is that these requests will decrease the amount of time that you wait before being seen by our physicians.       _____________________________________________________________  Should you have questions after your visit to [facility], please contact our office at [phone] between the hours of 8:30 a.m. and 4:30 p.m.  Voicemails left after 4:30 p.m. will not be returned until the following business day.  For prescription refill requests, have your pharmacy contact our office.       Resources For Cancer Patients and their Caregivers ? American Cancer Society: Can assist with transportation, wigs, general needs, runs Look Good Feel Better.        716-544-5920 ? Cancer Care: Provides financial assistance, online support groups, medication/co-pay assistance.  1-800-813-HOPE 904-241-2048) ? [place] Assists [place] cancer patients and their families through emotional , educational and financial support.   512-681-8787 ? [place] DSS Where to apply for food stamps, Medicaid and utility assistance. 818-581-6827 ? RCATS: Transportation to medical appointments. [phone] ? Social Security Administration: May apply for disability if have a Stage IV cancer. [phone] 820-845-4328 ? [employer], Disability and Transit Services: Assists with nutrition, care and transit needs. [place] Support Programs:   > Cancer Support Group  2nd Tuesday of the month 1pm-2pm, Journey Room   > Creative Journey  3rd Tuesday of the month 1130am-1pm, Journey Room

## 2017-08-06 ENCOUNTER — Encounter (HOSPITAL_COMMUNITY): Payer: Self-pay | Admitting: Hematology

## 2017-08-06 ENCOUNTER — Inpatient Hospital Stay (HOSPITAL_COMMUNITY): Payer: Medicare PPO

## 2017-08-06 ENCOUNTER — Encounter (HOSPITAL_COMMUNITY): Payer: Self-pay

## 2017-08-06 ENCOUNTER — Other Ambulatory Visit (HOSPITAL_COMMUNITY): Payer: Medicare PPO

## 2017-08-06 ENCOUNTER — Inpatient Hospital Stay (HOSPITAL_COMMUNITY): Payer: Medicare PPO | Attending: Internal Medicine | Admitting: Hematology

## 2017-08-06 ENCOUNTER — Other Ambulatory Visit: Payer: Self-pay

## 2017-08-06 VITALS — BP 124/72 | HR 68 | Temp 98.0°F | Resp 16 | Wt 211.5 lb

## 2017-08-06 VITALS — BP 128/78 | HR 52 | Temp 97.7°F | Resp 18

## 2017-08-06 DIAGNOSIS — C189 Malignant neoplasm of colon, unspecified: Secondary | ICD-10-CM | POA: Diagnosis not present

## 2017-08-06 DIAGNOSIS — Z5111 Encounter for antineoplastic chemotherapy: Secondary | ICD-10-CM | POA: Diagnosis not present

## 2017-08-06 DIAGNOSIS — I10 Essential (primary) hypertension: Secondary | ICD-10-CM | POA: Diagnosis not present

## 2017-08-06 DIAGNOSIS — G620 Drug-induced polyneuropathy: Secondary | ICD-10-CM | POA: Diagnosis not present

## 2017-08-06 DIAGNOSIS — Z452 Encounter for adjustment and management of vascular access device: Secondary | ICD-10-CM | POA: Insufficient documentation

## 2017-08-06 DIAGNOSIS — C787 Secondary malignant neoplasm of liver and intrahepatic bile duct: Secondary | ICD-10-CM | POA: Diagnosis not present

## 2017-08-06 DIAGNOSIS — E119 Type 2 diabetes mellitus without complications: Secondary | ICD-10-CM | POA: Diagnosis not present

## 2017-08-06 DIAGNOSIS — C7800 Secondary malignant neoplasm of unspecified lung: Secondary | ICD-10-CM

## 2017-08-06 LAB — CBC WITH DIFFERENTIAL/PLATELET
Basophils Absolute: 0 10*3/uL (ref 0.0–0.1)
Basophils Relative: 1 %
Eosinophils Absolute: 0.1 10*3/uL (ref 0.0–0.7)
Eosinophils Relative: 1 %
HEMATOCRIT: 35.7 % — AB (ref 39.0–52.0)
HEMOGLOBIN: 11.8 g/dL — AB (ref 13.0–17.0)
LYMPHS ABS: 1.8 10*3/uL (ref 0.7–4.0)
LYMPHS PCT: 27 %
MCH: 29.9 pg (ref 26.0–34.0)
MCHC: 33.1 g/dL (ref 30.0–36.0)
MCV: 90.6 fL (ref 78.0–100.0)
MONOS PCT: 6 %
Monocytes Absolute: 0.4 10*3/uL (ref 0.1–1.0)
NEUTROS PCT: 65 %
Neutro Abs: 4.4 10*3/uL (ref 1.7–7.7)
Platelets: 197 10*3/uL (ref 150–400)
RBC: 3.94 MIL/uL — AB (ref 4.22–5.81)
RDW: 16.3 % — ABNORMAL HIGH (ref 11.5–15.5)
WBC: 6.7 10*3/uL (ref 4.0–10.5)

## 2017-08-06 LAB — COMPREHENSIVE METABOLIC PANEL
ALT: 22 U/L (ref 17–63)
AST: 23 U/L (ref 15–41)
Albumin: 4 g/dL (ref 3.5–5.0)
Alkaline Phosphatase: 100 U/L (ref 38–126)
Anion gap: 11 (ref 5–15)
BUN: 14 mg/dL (ref 6–20)
CHLORIDE: 98 mmol/L — AB (ref 101–111)
CO2: 25 mmol/L (ref 22–32)
Calcium: 9.3 mg/dL (ref 8.9–10.3)
Creatinine, Ser: 0.97 mg/dL (ref 0.61–1.24)
Glucose, Bld: 349 mg/dL — ABNORMAL HIGH (ref 65–99)
POTASSIUM: 4.2 mmol/L (ref 3.5–5.1)
SODIUM: 134 mmol/L — AB (ref 135–145)
Total Bilirubin: 0.5 mg/dL (ref 0.3–1.2)
Total Protein: 7 g/dL (ref 6.5–8.1)

## 2017-08-06 MED ORDER — SODIUM CHLORIDE 0.9% FLUSH
10.0000 mL | INTRAVENOUS | Status: DC | PRN
  Administered 2017-08-06: 10 mL
  Filled 2017-08-06: qty 10

## 2017-08-06 MED ORDER — METFORMIN HCL 500 MG PO TABS: 500.0000 mg | ORAL_TABLET | Freq: Two times a day (BID) | ORAL | 4 refills | Status: AC

## 2017-08-06 MED ORDER — SODIUM CHLORIDE 0.9 % IV SOLN
900.0000 mg | Freq: Once | INTRAVENOUS | Status: AC
  Administered 2017-08-06: 900 mg via INTRAVENOUS
  Filled 2017-08-06: qty 35

## 2017-08-06 MED ORDER — IRINOTECAN HCL CHEMO INJECTION 100 MG/5ML
144.0000 mg/m2 | Freq: Once | INTRAVENOUS | Status: AC
  Administered 2017-08-06: 320 mg via INTRAVENOUS
  Filled 2017-08-06: qty 15

## 2017-08-06 MED ORDER — ATROPINE SULFATE 1 MG/ML IJ SOLN
0.5000 mg | Freq: Once | INTRAMUSCULAR | Status: AC
  Administered 2017-08-06: 0.5 mg via INTRAVENOUS
  Filled 2017-08-06: qty 1

## 2017-08-06 MED ORDER — DEXAMETHASONE SODIUM PHOSPHATE 10 MG/ML IJ SOLN
10.0000 mg | Freq: Once | INTRAMUSCULAR | Status: AC
  Administered 2017-08-06: 10 mg via INTRAVENOUS
  Filled 2017-08-06: qty 1

## 2017-08-06 MED ORDER — PALONOSETRON HCL INJECTION 0.25 MG/5ML
0.2500 mg | Freq: Once | INTRAVENOUS | Status: AC
  Administered 2017-08-06: 0.25 mg via INTRAVENOUS
  Filled 2017-08-06: qty 5

## 2017-08-06 MED ORDER — FLUOROURACIL CHEMO INJECTION 2.5 GM/50ML
320.0000 mg/m2 | Freq: Once | INTRAVENOUS | Status: AC
  Administered 2017-08-06: 700 mg via INTRAVENOUS
  Filled 2017-08-06: qty 14

## 2017-08-06 MED ORDER — SODIUM CHLORIDE 0.9 % IV SOLN
Freq: Once | INTRAVENOUS | Status: AC
  Administered 2017-08-06: 11:00:00 via INTRAVENOUS

## 2017-08-06 MED ORDER — SODIUM CHLORIDE 0.9 % IV SOLN: 10.0000 mg | Freq: Once | INTRAVENOUS | Status: DC

## 2017-08-06 MED ORDER — SODIUM CHLORIDE 0.9 % IV SOLN
1920.0000 mg/m2 | INTRAVENOUS | Status: DC
  Administered 2017-08-06: 4200 mg via INTRAVENOUS
  Filled 2017-08-06: qty 84

## 2017-08-06 NOTE — Patient Instructions (Signed)
[facility] Discharge Instructions for Patients Receiving Chemotherapy   Beginning January 23rd 2017 lab work for the Cancer Center will be done in the  Main lab at [place] on 1st floor. If you have a lab appointment with the Cancer Center please come in thru the  Main Entrance and check in at the main information desk   Today you received the following chemotherapy agents   To help prevent nausea and vomiting after your treatment, we encourage you to take your nausea medication     If you develop nausea and vomiting, or diarrhea that is not controlled by your medication, call the clinic.  The clinic phone number is [phone]. Office hours are Monday-Friday 8:30am-5:00pm.  BELOW ARE SYMPTOMS THAT SHOULD BE REPORTED IMMEDIATELY:  *FEVER GREATER THAN 101.0 F  *CHILLS WITH OR WITHOUT FEVER  NAUSEA AND VOMITING THAT IS NOT CONTROLLED WITH YOUR NAUSEA MEDICATION  *UNUSUAL SHORTNESS OF BREATH  *UNUSUAL BRUISING OR BLEEDING  TENDERNESS IN MOUTH AND THROAT WITH OR WITHOUT PRESENCE OF ULCERS  *URINARY PROBLEMS  *BOWEL PROBLEMS  UNUSUAL RASH Items with * indicate a potential emergency and should be followed up as soon as possible. If you have an emergency after office hours please contact your primary care physician or go to the nearest emergency department.  Please call the clinic during office hours if you have any questions or concerns.   You may also contact the Patient Navigator at [phone] should you have any questions or need assistance in obtaining follow up care.      Resources For Cancer Patients and their Caregivers ? American Cancer Society: Can assist with transportation, wigs, general needs, runs Look Good Feel Better.        1-888-227-6333 ? Cancer Care: Provides financial assistance, online support groups, medication/co-pay assistance.  1-800-813-HOPE (4673) ? [facility] Assists [place] cancer  patients and their families through emotional , educational and financial support.  336-427-4357 ? [place] DSS Where to apply for food stamps, Medicaid and utility assistance. 336-342-1394 ? RCATS: Transportation to medical appointments. 336-347-2287 ? Social Security Administration: May apply for disability if have a Stage IV cancer. [phone] 1-800-772-1213 ? [place] Aging, Disability and Transit Services: Assists with nutrition, care and transit needs. [phone]         

## 2017-08-06 NOTE — Assessment & Plan Note (Signed)
1.  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  As a result he had some hemorrhoidal bleeding associated with it.  Finally he was able to get the constipation under control.  I have told him to take stool softener starting today.  He will proceed with cycle 6 of chemotherapy today.  His 5-FU and CPT-11 doses were already reduced by 20%.  His last CEA has come down to 15.  I plan to repeat CT scans after this cycle.    2.  Diabetes: He was taking metformin 500 mg once daily mostly.  If he takes twice daily he gets diarrhea.  He was started on glipizide daily.  His fasting blood sugars are in the range of 200.  He is also on Lipitor.  3.  Peripheral neuropathy: Numbness in the feet, predominantly at bedtime is stable.  4.  Hypertension: He will continue valsartan HCTZ 160-77m daily.

## 2017-08-06 NOTE — Progress Notes (Signed)
[place], [place] [address]   CLINIC:  Medical Oncology/Hematology  PCP:  [name], MD [place] [address] [phone]   REASON FOR VISIT:  Follow-up for metastatic colon cancer.  CURRENT THERAPY: FOLFIRI every 2 weeks.  BRIEF ONCOLOGIC HISTORY:   No history exists.     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 male returns for routine follow-up and consideration for next cycle of chemotherapy.  He is here for cycle 6 of chemotherapy.  He had mainly constipation after last cycle.  As a result he had some hemorrhoidal bleeding.  The bleeding subsided once the constipation improved.  Denies any fevers or infections.  He requests a refill for metformin.  His fasting sugars are staying in the 200 range since he started glipizide.  He denies any abdominal cramping or diarrhea.  No mucositis or hand-foot skin reaction noted.    REVIEW OF SYSTEMS:  Review of Systems  Constitutional: Positive for fatigue.  Gastrointestinal: Positive for constipation.  All other systems reviewed and are negative.    PAST MEDICAL/SURGICAL HISTORY:  Past Medical History:  Diagnosis Date  . Colon cancer ([place])   . Diabetes [facility])    Past Surgical History:  Procedure Laterality Date  . SHOULDER SURGERY  2003     SOCIAL HISTORY:  Social History   Socioeconomic History  . Marital status: Married    Spouse name: Not on file  . Number of children: Not on file  . Years of education: Not on file  . Highest education level: Not on file  Occupational History  . Not on file  Social Needs  . Financial resource strain: Not on file  . Food insecurity:    Worry: Not on file    Inability: Not on file  .  Transportation needs:    Medical: Not on file    Non-medical: Not on file  Tobacco Use  . Smoking status: Former Smoker    Last attempt to quit: 04/05/1995    Years since quitting: 22.3  . Smokeless tobacco: Never Used  Substance and Sexual Activity  . Alcohol use: Not Currently    Alcohol/week: 0.0 oz    Comment: used to drink but stopped about 20 years ago  . Drug use: Never  . Sexual activity: Not on file  Lifestyle  . Physical activity:    Days per week: Not on file    Minutes per session: Not on file  . Stress: Not on file  Relationships  . Social connections:    Talks on phone: Not on file    Gets together: Not on file    Attends religious service: Not on file    Active member of club or organization: Not on file    Attends meetings of clubs or organizations: Not on file    Relationship status: Not on file  . Intimate partner violence:    Fear of current or ex partner: Not on file    Emotionally abused: Not on file    Physically abused: Not on file    Forced sexual activity: Not on file  Other Topics Concern  . Not on file  Social History Narrative  . Not on file  FAMILY HISTORY:  Family History  Problem Relation Age of Onset  . Diabetes Mother   . Cancer Father   . Bone cancer Father   . Colon cancer Neg Hx   . Colon polyps Neg Hx     CURRENT MEDICATIONS:  Outpatient Encounter Medications as of 08/06/2017  Medication Sig Note  . Ascorbic Acid (VITAMIN C) 1000 MG tablet Take 1,000 mg by mouth daily.   Marland Kitchen aspirin 81 MG tablet Take 81 mg by mouth daily.   Marland Kitchen atorvastatin (LIPITOR) 10 MG tablet    . BEE POLLEN PO Take by mouth.   . Blood Glucose Monitoring Suppl (ONE TOUCH ULTRA MINI) W/DEVICE KIT USE TO CHECK BLOOD SUGAR DAILY. 04/05/2015: Received from: External Pharmacy  . Coenzyme Q10 (CO Q 10 PO) Take by mouth.   . erythromycin (E-MYCIN) 250 MG tablet TAKE TWO (2) TABLETS BY MOUTH AT 100 PM, 200 PM, AND 1000 PM PRIOR TO SURGERY 04/05/2015: Received from:  External Pharmacy  . glipiZIDE (GLUCOTROL XL) 10 MG 24 hr tablet    . Glucosamine-Chondroit-Collagen ([employer]S GLUCO-CHONDROIT PLUS UC-II PO) Take by mouth.   . magnesium oxide (MAG-OX) 400 MG tablet Take 400 mg by mouth 2 (two) times daily.   . metFORMIN (GLUCOPHAGE) 500 MG tablet Take 1 tablet (500 mg total) by mouth 2 (two) times daily with a meal.   . omeprazole (PRILOSEC) 20 MG capsule 1 PO 30 mins prior to breakfast   . valsartan-hydrochlorothiazide (DIOVAN-HCT) 160-25 MG tablet Take 1 tablet by mouth daily.   . [DISCONTINUED] metFORMIN (GLUCOPHAGE) 500 MG tablet TAKE ONE TABLET BY MOUTH ONCE DAILY FOR ONE WEEK. IF NO DIARRHEA INCREASE TO TWICE DAILY    No facility-administered encounter medications on file as of 08/06/2017.     ALLERGIES:  No Known Allergies   PHYSICAL EXAM:  ECOG Performance status: 1  Vitals:   08/06/17 0942  BP: 124/72  Pulse: 68  Resp: 16  Temp: 98 F (36.7 C)  SpO2: 99%   Filed Weights   08/06/17 0942  Weight: 211 lb 8 oz (95.9 kg)       LABORATORY DATA:  I have reviewed the labs as listed.  CBC    Component Value Date/Time   WBC 6.7 08/06/2017 0944   RBC 3.94 (L) 08/06/2017 0944   HGB 11.8 (L) 08/06/2017 0944   HCT 35.7 (L) 08/06/2017 0944   PLT 197 08/06/2017 0944   MCV 90.6 08/06/2017 0944   MCH 29.9 08/06/2017 0944   MCHC 33.1 08/06/2017 0944   RDW 16.3 (H) 08/06/2017 0944   LYMPHSABS 1.8 08/06/2017 0944   MONOABS 0.4 08/06/2017 0944   EOSABS 0.1 08/06/2017 0944   BASOSABS 0.0 08/06/2017 0944   CMP Latest Ref Rng & Units 08/06/2017 07/23/2017 07/09/2017  Glucose 65 - 99 mg/dL 349(H) 493(H) 416(H)  BUN 6 - 20 mg/dL 14 16 21(H)  Creatinine 0.61 - 1.24 mg/dL 0.97 1.10 1.16  Sodium 135 - 145 mmol/L 134(L) 130(L) 130(L)  Potassium 3.5 - 5.1 mmol/L 4.2 4.0 4.0  Chloride 101 - 111 mmol/L 98(L) 92(L) 92(L)  CO2 22 - 32 mmol/L '25 25 25  ' Calcium 8.9 - 10.3 mg/dL 9.3 9.9 9.4  Total Protein 6.5 - 8.1 g/dL 7.0 7.3 7.3  Total Bilirubin 0.3 -  1.2 mg/dL 0.5 1.0 0.9  Alkaline Phos 38 - 126 U/L 100 106 102  AST 15 - 41 U/L '23 23 24  ' ALT 17 - 63 U/L 22 23 24  ASSESSMENT & PLAN:   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  As a result he had some hemorrhoidal bleeding associated with it.  Finally he was able to get the constipation under control.  I have told him to take stool softener starting today.  He will proceed with cycle 6 of chemotherapy today.  His 5-FU and CPT-11 doses were already reduced by 20%.  His last CEA has come down to 15.  I plan to repeat CT scans after this cycle.    2.  Diabetes: He was taking metformin 500 mg once daily mostly.  If he takes twice daily he gets diarrhea.  He was started on glipizide daily.  His fasting blood sugars are in the range of 200.  He is also on Lipitor.  3.  Peripheral neuropathy: Numbness in the feet, predominantly at bedtime is stable.  4.  Hypertension: He will continue valsartan HCTZ 160-7m daily.      Orders placed this encounter:  Orders Placed This Encounter  Procedures  . CT Chest W Contrast  . CT Abdomen Pelvis W Contrast  . CBC with Differential  . Comprehensive metabolic panel  . CEA      SDerek Jack MD [id number] [phone]

## 2017-08-06 NOTE — Progress Notes (Signed)
Labs reviewed by Dr. Delton Coombes. Patient seen by MD today as well. Will proceed with treatment.  Continuous 5FU pump connected today per protocol.   Treatment given per orders. Patient tolerated it well without problems. Vitals stable and discharged home from clinic ambulatory. Follow up as scheduled.

## 2017-08-07 LAB — CEA: CEA1: 8.7 ng/mL — AB (ref 0.0–4.7)

## 2017-08-08 ENCOUNTER — Inpatient Hospital Stay (HOSPITAL_COMMUNITY): Payer: Medicare PPO

## 2017-08-08 ENCOUNTER — Encounter (HOSPITAL_COMMUNITY): Payer: Self-pay

## 2017-08-08 ENCOUNTER — Other Ambulatory Visit: Payer: Self-pay

## 2017-08-08 VITALS — BP 110/69 | HR 62 | Temp 98.2°F | Resp 20

## 2017-08-08 DIAGNOSIS — C189 Malignant neoplasm of colon, unspecified: Secondary | ICD-10-CM | POA: Diagnosis not present

## 2017-08-08 DIAGNOSIS — C787 Secondary malignant neoplasm of liver and intrahepatic bile duct: Secondary | ICD-10-CM | POA: Diagnosis not present

## 2017-08-08 DIAGNOSIS — C7800 Secondary malignant neoplasm of unspecified lung: Secondary | ICD-10-CM | POA: Diagnosis not present

## 2017-08-08 DIAGNOSIS — Z5111 Encounter for antineoplastic chemotherapy: Secondary | ICD-10-CM | POA: Diagnosis not present

## 2017-08-08 DIAGNOSIS — Z452 Encounter for adjustment and management of vascular access device: Secondary | ICD-10-CM | POA: Diagnosis not present

## 2017-08-08 MED ORDER — HEPARIN SOD (PORK) LOCK FLUSH 100 UNIT/ML IV SOLN
500.0000 [IU] | Freq: Once | INTRAVENOUS | Status: AC | PRN
  Administered 2017-08-08: 500 [IU]

## 2017-08-08 MED ORDER — SODIUM CHLORIDE 0.9% FLUSH
10.0000 mL | INTRAVENOUS | Status: DC | PRN
  Administered 2017-08-08: 10 mL
  Filled 2017-08-08: qty 10

## 2017-08-08 NOTE — Patient Instructions (Signed)
[place] at [facility] Discharge Instructions  Discontinued continuous 5FU pump.  Follow up as scheduled.   Thank you for choosing [place] at [facility] to provide your oncology and hematology care.  To afford each patient quality time with our provider, please arrive at least 15 minutes before your scheduled appointment time.   If you have a lab appointment with the [facility] please come in thru the  Main Entrance and check in at the main information desk  You need to re-schedule your appointment should you arrive 10 or more minutes late.  We strive to give you quality time with our providers, and arriving late affects you and other patients whose appointments are after yours.  Also, if you no show three or more times for appointments you may be dismissed from the clinic at the providers discretion.     Again, thank you for choosing [facility].  Our hope is that these requests will decrease the amount of time that you wait before being seen by our physicians.       _____________________________________________________________  Should you have questions after your visit to [facility], please contact our office at [phone] between the hours of 8:30 a.m. and 4:30 p.m.  Voicemails left after 4:30 p.m. will not be returned until the following business day.  For prescription refill requests, have your pharmacy contact our office.       Resources For Cancer Patients and their Caregivers ? American Cancer Society: Can assist with transportation, wigs, general needs, runs Look Good Feel Better.        740-854-5895 ? Cancer Care: Provides financial assistance, online support groups, medication/co-pay assistance.  1-800-813-HOPE (712)306-9101) ? [place] Assists [place] cancer patients and their families through emotional , educational and financial support.   ([phone] ? [place] DSS Where to apply for food stamps, Medicaid and utility assistance. ([phone] 036-2526 ? RCATS: Transportation to medical appointments. 7082979967 ? Social Security Administration: May apply for disability if have a Stage IV cancer. [phone] 205-664-8860 ? [employer], Disability and Transit Services: Assists with nutrition, care and transit needs. [place] Support Programs:   > Cancer Support Group  2nd Tuesday of the month 1pm-2pm, Journey Room   > Creative Journey  3rd Tuesday of the month 1130am-1pm, Journey Room

## 2017-08-08 NOTE — Progress Notes (Signed)
Christopher Friedman returns today for port de access and flush after 46 hr continous infusion of 5fu. Tolerated infusion without problems. Portacath located right chest wall was  deaccessed and flushed with 20ml NS and 500U/5ml Heparin and needle removed intact.  Procedure without incident. Patient tolerated procedure well.    

## 2017-08-09 DIAGNOSIS — C189 Malignant neoplasm of colon, unspecified: Secondary | ICD-10-CM | POA: Diagnosis not present

## 2017-08-18 ENCOUNTER — Ambulatory Visit (HOSPITAL_COMMUNITY)
Admission: RE | Admit: 2017-08-18 | Discharge: 2017-08-18 | Disposition: A | Discharge status: Home / Self Care | Payer: Medicare PPO | Source: Ambulatory Visit | Attending: Hematology | Admitting: Hematology

## 2017-08-18 ENCOUNTER — Inpatient Hospital Stay (HOSPITAL_COMMUNITY): Payer: Medicare PPO

## 2017-08-18 DIAGNOSIS — C787 Secondary malignant neoplasm of liver and intrahepatic bile duct: Secondary | ICD-10-CM | POA: Insufficient documentation

## 2017-08-18 DIAGNOSIS — K4090 Unilateral inguinal hernia, without obstruction or gangrene, not specified as recurrent: Secondary | ICD-10-CM | POA: Insufficient documentation

## 2017-08-18 DIAGNOSIS — I700 Atherosclerosis of aorta: Secondary | ICD-10-CM | POA: Insufficient documentation

## 2017-08-18 DIAGNOSIS — C7800 Secondary malignant neoplasm of unspecified lung: Secondary | ICD-10-CM | POA: Diagnosis not present

## 2017-08-18 DIAGNOSIS — K5900 Constipation, unspecified: Secondary | ICD-10-CM | POA: Diagnosis not present

## 2017-08-18 DIAGNOSIS — Z452 Encounter for adjustment and management of vascular access device: Secondary | ICD-10-CM | POA: Diagnosis not present

## 2017-08-18 DIAGNOSIS — I2510 Atherosclerotic heart disease of native coronary artery without angina pectoris: Secondary | ICD-10-CM | POA: Insufficient documentation

## 2017-08-18 DIAGNOSIS — K449 Diaphragmatic hernia without obstruction or gangrene: Secondary | ICD-10-CM | POA: Diagnosis not present

## 2017-08-18 DIAGNOSIS — C189 Malignant neoplasm of colon, unspecified: Secondary | ICD-10-CM | POA: Diagnosis not present

## 2017-08-18 DIAGNOSIS — M5136 Other intervertebral disc degeneration, lumbar region: Secondary | ICD-10-CM | POA: Diagnosis not present

## 2017-08-18 DIAGNOSIS — Z5111 Encounter for antineoplastic chemotherapy: Secondary | ICD-10-CM | POA: Diagnosis not present

## 2017-08-18 LAB — CBC WITH DIFFERENTIAL/PLATELET
Basophils Absolute: 0 10*3/uL (ref 0.0–0.1)
Basophils Relative: 0 %
EOS ABS: 0.1 10*3/uL (ref 0.0–0.7)
Eosinophils Relative: 1 %
HCT: 35.6 % — ABNORMAL LOW (ref 39.0–52.0)
Hemoglobin: 11.5 g/dL — ABNORMAL LOW (ref 13.0–17.0)
LYMPHS ABS: 2.3 10*3/uL (ref 0.7–4.0)
Lymphocytes Relative: 34 %
MCH: 29.9 pg (ref 26.0–34.0)
MCHC: 32.3 g/dL (ref 30.0–36.0)
MCV: 92.7 fL (ref 78.0–100.0)
Monocytes Absolute: 0.6 10*3/uL (ref 0.1–1.0)
Monocytes Relative: 8 %
NEUTROS PCT: 57 %
Neutro Abs: 3.8 10*3/uL (ref 1.7–7.7)
Platelets: 216 10*3/uL (ref 150–400)
RBC: 3.84 MIL/uL — ABNORMAL LOW (ref 4.22–5.81)
RDW: 17 % — ABNORMAL HIGH (ref 11.5–15.5)
WBC: 6.8 10*3/uL (ref 4.0–10.5)

## 2017-08-18 LAB — COMPREHENSIVE METABOLIC PANEL
ALBUMIN: 4.1 g/dL (ref 3.5–5.0)
ALT: 24 U/L (ref 17–63)
AST: 22 U/L (ref 15–41)
Alkaline Phosphatase: 85 U/L (ref 38–126)
Anion gap: 9 (ref 5–15)
BUN: 14 mg/dL (ref 6–20)
CALCIUM: 9.1 mg/dL (ref 8.9–10.3)
CO2: 27 mmol/L (ref 22–32)
CREATININE: 0.99 mg/dL (ref 0.61–1.24)
Chloride: 98 mmol/L — ABNORMAL LOW (ref 101–111)
GFR calc non Af Amer: >60 mL/min (ref >60)
GLUCOSE: 139 mg/dL — AB (ref 65–99)
Potassium: 3.8 mmol/L (ref 3.5–5.1)
SODIUM: 134 mmol/L — AB (ref 135–145)
Total Bilirubin: 0.6 mg/dL (ref 0.3–1.2)
Total Protein: 7.4 g/dL (ref 6.5–8.1)

## 2017-08-18 MED ORDER — IOPAMIDOL (ISOVUE-300) INJECTION 61%
100.0000 mL | Freq: Once | INTRAVENOUS | Status: AC | PRN
  Administered 2017-08-18: 100 mL via INTRAVENOUS

## 2017-08-19 LAB — CEA: CEA: 5.8 ng/mL — ABNORMAL HIGH (ref 0.0–4.7)

## 2017-08-20 ENCOUNTER — Inpatient Hospital Stay (HOSPITAL_COMMUNITY): Payer: Medicare PPO

## 2017-08-20 ENCOUNTER — Encounter (HOSPITAL_COMMUNITY): Payer: Self-pay | Admitting: Hematology

## 2017-08-20 ENCOUNTER — Inpatient Hospital Stay (HOSPITAL_BASED_OUTPATIENT_CLINIC_OR_DEPARTMENT_OTHER): Payer: Medicare PPO | Admitting: Hematology

## 2017-08-20 ENCOUNTER — Other Ambulatory Visit: Payer: Self-pay

## 2017-08-20 VITALS — BP 122/80 | HR 69 | Temp 98.2°F | Resp 18 | Wt 215.0 lb

## 2017-08-20 VITALS — BP 132/83 | HR 62 | Temp 97.8°F | Resp 18

## 2017-08-20 DIAGNOSIS — C787 Secondary malignant neoplasm of liver and intrahepatic bile duct: Secondary | ICD-10-CM

## 2017-08-20 DIAGNOSIS — I10 Essential (primary) hypertension: Secondary | ICD-10-CM | POA: Diagnosis not present

## 2017-08-20 DIAGNOSIS — Z7984 Long term (current) use of oral hypoglycemic drugs: Secondary | ICD-10-CM | POA: Diagnosis not present

## 2017-08-20 DIAGNOSIS — Z5111 Encounter for antineoplastic chemotherapy: Secondary | ICD-10-CM | POA: Diagnosis not present

## 2017-08-20 DIAGNOSIS — C7800 Secondary malignant neoplasm of unspecified lung: Secondary | ICD-10-CM

## 2017-08-20 DIAGNOSIS — Z452 Encounter for adjustment and management of vascular access device: Secondary | ICD-10-CM | POA: Diagnosis not present

## 2017-08-20 DIAGNOSIS — E1142 Type 2 diabetes mellitus with diabetic polyneuropathy: Secondary | ICD-10-CM

## 2017-08-20 DIAGNOSIS — C189 Malignant neoplasm of colon, unspecified: Secondary | ICD-10-CM

## 2017-08-20 MED ORDER — ATROPINE SULFATE 1 MG/ML IJ SOLN
0.5000 mg | Freq: Once | INTRAMUSCULAR | Status: AC
  Administered 2017-08-20: 0.5 mg via INTRAVENOUS
  Filled 2017-08-20: qty 1

## 2017-08-20 MED ORDER — SODIUM CHLORIDE 0.9 % IV SOLN
Freq: Once | INTRAVENOUS | Status: AC
  Administered 2017-08-20: 10:00:00 via INTRAVENOUS

## 2017-08-20 MED ORDER — PALONOSETRON HCL INJECTION 0.25 MG/5ML
0.2500 mg | Freq: Once | INTRAVENOUS | Status: AC
  Administered 2017-08-20: 0.25 mg via INTRAVENOUS
  Filled 2017-08-20: qty 5

## 2017-08-20 MED ORDER — FLUOROURACIL CHEMO INJECTION 2.5 GM/50ML
320.0000 mg/m2 | Freq: Once | INTRAVENOUS | Status: AC
  Administered 2017-08-20: 700 mg via INTRAVENOUS
  Filled 2017-08-20: qty 14

## 2017-08-20 MED ORDER — IRINOTECAN HCL CHEMO INJECTION 100 MG/5ML
144.0000 mg/m2 | Freq: Once | INTRAVENOUS | Status: AC
  Administered 2017-08-20: 320 mg via INTRAVENOUS
  Filled 2017-08-20: qty 15

## 2017-08-20 MED ORDER — LEUCOVORIN CALCIUM INJECTION 350 MG
900.0000 mg | Freq: Once | INTRAVENOUS | Status: AC
  Administered 2017-08-20: 900 mg via INTRAVENOUS
  Filled 2017-08-20: qty 10

## 2017-08-20 MED ORDER — SODIUM CHLORIDE 0.9% FLUSH
10.0000 mL | INTRAVENOUS | Status: DC | PRN
  Administered 2017-08-20: 10 mL
  Filled 2017-08-20: qty 10

## 2017-08-20 MED ORDER — SODIUM CHLORIDE 0.9 % IV SOLN
1920.0000 mg/m2 | INTRAVENOUS | Status: DC
  Administered 2017-08-20: 4200 mg via INTRAVENOUS
  Filled 2017-08-20: qty 84

## 2017-08-20 MED ORDER — DEXAMETHASONE SODIUM PHOSPHATE 10 MG/ML IJ SOLN
10.0000 mg | Freq: Once | INTRAMUSCULAR | Status: AC
  Administered 2017-08-20: 10 mg via INTRAVENOUS
  Filled 2017-08-20: qty 1

## 2017-08-20 MED ORDER — SODIUM CHLORIDE 0.9 % IV SOLN: 10.0000 mg | Freq: Once | INTRAVENOUS | Status: DC

## 2017-08-20 NOTE — Patient Instructions (Signed)
[facility] Discharge Instructions for Patients Receiving Chemotherapy   Beginning January 23rd 2017 lab work for the Cancer Center will be done in the  Main lab at [place] on 1st floor. If you have a lab appointment with the Cancer Center please come in thru the  Main Entrance and check in at the main information desk   Today you received the following chemotherapy agents   To help prevent nausea and vomiting after your treatment, we encourage you to take your nausea medication     If you develop nausea and vomiting, or diarrhea that is not controlled by your medication, call the clinic.  The clinic phone number is [phone]. Office hours are Monday-Friday 8:30am-5:00pm.  BELOW ARE SYMPTOMS THAT SHOULD BE REPORTED IMMEDIATELY:  *FEVER GREATER THAN 101.0 F  *CHILLS WITH OR WITHOUT FEVER  NAUSEA AND VOMITING THAT IS NOT CONTROLLED WITH YOUR NAUSEA MEDICATION  *UNUSUAL SHORTNESS OF BREATH  *UNUSUAL BRUISING OR BLEEDING  TENDERNESS IN MOUTH AND THROAT WITH OR WITHOUT PRESENCE OF ULCERS  *URINARY PROBLEMS  *BOWEL PROBLEMS  UNUSUAL RASH Items with * indicate a potential emergency and should be followed up as soon as possible. If you have an emergency after office hours please contact your primary care physician or go to the nearest emergency department.  Please call the clinic during office hours if you have any questions or concerns.   You may also contact the Patient Navigator at [phone] should you have any questions or need assistance in obtaining follow up care.      Resources For Cancer Patients and their Caregivers ? American Cancer Society: Can assist with transportation, wigs, general needs, runs Look Good Feel Better.        1-888-227-6333 ? Cancer Care: Provides financial assistance, online support groups, medication/co-pay assistance.  1-800-813-HOPE (4673) ? [facility] Assists [place] cancer  patients and their families through emotional , educational and financial support.  336-427-4357 ? [place] DSS Where to apply for food stamps, Medicaid and utility assistance. 336-342-1394 ? [facility]: Transportation to medical appointments. 336-347-2287 ? Social Security Administration: May apply for disability if have a Stage IV cancer. 336-342-7796 1-800-772-1213 ? [place] Aging, Disability and Transit Services: Assists with nutrition, care and transit needs. 336-349-2343         

## 2017-08-20 NOTE — Progress Notes (Signed)
Labs reviewed with MD. Patient examined by MD today. Proceed with treatmetnt.  Connected to continuous 5FU pump per protocol.  Treatment given per orders. Patient tolerated it well without problems. Vitals stable and discharged home from clinic ambulatory. Follow up as scheduled.

## 2017-08-20 NOTE — Assessment & Plan Note (Signed)
1.  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 -CEA has come down to 5.8.  I have discussed the results of the CT scan of the chest, abdomen and pelvis which showed decrease in left lower lobe lung nodule to 0.5 cm, previously 1 cm and decrease in the left hepatic lobe mass to 4.7 cm, previously 7.7 cm. - He is tolerating current therapy very well.  We will proceed with same doses of chemotherapy.  We plan to rescan him after 4-6 cycles.  He is agreeable to this plan.  He usually gets slight constipation one day after each treatment.  He will take stool softeners.  2.  Diabetes: He was taking metformin 500 mg once daily mostly.  If he takes twice daily he gets diarrhea.  He was started on glipizide daily.  His fasting blood sugars are in the range of 200.  He is also on Lipitor.  3.  Peripheral neuropathy: Numbness in the feet, predominantly at bedtime is stable.  4.  Hypertension: He will continue valsartan HCTZ 160-44m daily.

## 2017-08-20 NOTE — Patient Instructions (Signed)
[facility] at [facility] Discharge Instructions  Today you saw Dr. K.   Thank you for choosing [facility] at [facility] to provide your oncology and hematology care.  To afford each patient quality time with our provider, please arrive at least 15 minutes before your scheduled appointment time.   If you have a lab appointment with the Cancer Center please come in thru the  Main Entrance and check in at the main information desk  You need to re-schedule your appointment should you arrive 10 or more minutes late.  We strive to give you quality time with our providers, and arriving late affects you and other patients whose appointments are after yours.  Also, if you no show three or more times for appointments you may be dismissed from the clinic at the providers discretion.     Again, thank you for choosing [facility].  Our hope is that these requests will decrease the amount of time that you wait before being seen by our physicians.       _____________________________________________________________  Should you have questions after your visit to [facility], please contact our office at [phone] between the hours of 8:30 a.m. and 4:30 p.m.  Voicemails left after 4:30 p.m. will not be returned until the following business day.  For prescription refill requests, have your pharmacy contact our office.       Resources For Cancer Patients and their Caregivers ? American Cancer Society: Can assist with transportation, wigs, general needs, runs Look Good Feel Better.        1-888-227-6333 ? Cancer Care: Provides financial assistance, online support groups, medication/co-pay assistance.  1-800-813-HOPE (4673) ? [facility] Assists [place] cancer patients and their families through emotional , educational and financial support.  336-427-4357 ? [place] DSS Where to apply for food  stamps, Medicaid and utility assistance. 336-342-1394 ? [facility]: Transportation to medical appointments. 336-347-2287 ? Social Security Administration: May apply for disability if have a Stage IV cancer. 336-342-7796 1-800-772-1213 ? [place] Aging, Disability and Transit Services: Assists with nutrition, care and transit needs. 336-349-2343  Cancer Center Support Programs:   > Cancer Support Group  2nd Tuesday of the month 1pm-2pm, Journey Room   > Creative Journey  3rd Tuesday of the month 1130am-1pm, Journey Room    

## 2017-08-20 NOTE — Progress Notes (Signed)
[place], [place] [address]   CLINIC:  Medical Oncology/Hematology  PCP:  [name], MD [place] [address] [phone]   REASON FOR VISIT:  Follow-up for metastatic colon cancer.  CURRENT THERAPY: FOLFIRI every 2 weeks.  CANCER STAGING: Cancer Staging Adenocarcinoma of colon metastatic to liver [facility]) Staging form: Colon and Rectum, AJCC 8th Edition - Clinical stage from 07/13/2017: Stage IVB (pM1b) - Signed by Derek Jack, MD on 07/13/2017    INTERVAL HISTORY:  Mr. Ludlam 67 y.o. male returns for routine follow-up and consideration for next cycle of chemotherapy.    Here for consideration for cycle #7 FOLFIRI chemotherapy.    Recently had CT chest/abd/pelvis scans; he would like to review those results together today.   He has mild fatigue which is stable.  He  has some trouble swallowing which is also stable.  He has constipation 1 day after each cycle of chemotherapy.  His numbness in the feet has been stable.  Overall he is tolerating chemotherapy very well.  Denies any abdominal cramping as he is getting atropine.      REVIEW OF SYSTEMS:  Review of Systems  Constitutional: Positive for fatigue.  Gastrointestinal: Positive for constipation.  All other systems reviewed and are negative.    PAST MEDICAL/SURGICAL HISTORY:  Past Medical History:  Diagnosis Date  . Colon cancer ([place])   . Diabetes [facility])    Past Surgical History:  Procedure Laterality Date  . SHOULDER SURGERY  2003     SOCIAL HISTORY:  Social History   Socioeconomic History  . Marital status: Married    Spouse name: Not on file  . Number of children: Not on file  . Years of education: Not on file  . Highest education level: Not on file  Occupational History  . Not on file  Social Needs  . Financial resource strain: Not on file  . Food insecurity:    Worry: Not on file    Inability: Not on file  . Transportation  needs:    Medical: Not on file    Non-medical: Not on file  Tobacco Use  . Smoking status: Former Smoker    Last attempt to quit: 04/05/1995    Years since quitting: 22.3  . Smokeless tobacco: Never Used  Substance and Sexual Activity  . Alcohol use: Not Currently    Alcohol/week: 0.0 oz    Comment: used to drink but stopped about 20 years ago  . Drug use: Never  . Sexual activity: Not on file  Lifestyle  . Physical activity:    Days per week: Not on file    Minutes per session: Not on file  . Stress: Not on file  Relationships  . Social connections:    Talks on phone: Not on file    Gets together: Not on file    Attends religious service: Not on file    Active member of club or organization: Not on file    Attends meetings of clubs or organizations: Not on file    Relationship status: Not on file  . Intimate partner violence:    Fear of current or ex partner: Not on file    Emotionally abused: Not on file    Physically abused: Not on file    Forced sexual activity: Not on file  Other Topics Concern  . Not on file  Social History Narrative  . Not on file    FAMILY  HISTORY:  Family History  Problem Relation Age of Onset  . Diabetes Mother   . Cancer Father   . Bone cancer Father   . Colon cancer Neg Hx   . Colon polyps Neg Hx     CURRENT MEDICATIONS:  Outpatient Encounter Medications as of 08/20/2017  Medication Sig Note  . Ascorbic Acid (VITAMIN C) 1000 MG tablet Take 1,000 mg by mouth daily.   Marland Kitchen aspirin 81 MG tablet Take 81 mg by mouth daily.   Marland Kitchen atorvastatin (LIPITOR) 10 MG tablet    . BEE POLLEN PO Take by mouth.   . Blood Glucose Monitoring Suppl (ONE TOUCH ULTRA MINI) W/DEVICE KIT USE TO CHECK BLOOD SUGAR DAILY. 04/05/2015: Received from: External Pharmacy  . Coenzyme Q10 (CO Q 10 PO) Take by mouth.   . erythromycin (E-MYCIN) 250 MG tablet TAKE TWO (2) TABLETS BY MOUTH AT 100 PM, 200 PM, AND 1000 PM PRIOR TO SURGERY 04/05/2015: Received from: External  Pharmacy  . glipiZIDE (GLUCOTROL XL) 10 MG 24 hr tablet    . Glucosamine-Chondroit-Collagen ([employer] GLUCO-CHONDROIT PLUS UC-II PO) Take by mouth.   . magnesium oxide (MAG-OX) 400 MG tablet Take 400 mg by mouth 2 (two) times daily.   . metFORMIN (GLUCOPHAGE) 500 MG tablet Take 1 tablet (500 mg total) by mouth 2 (two) times daily with a meal.   . omeprazole (PRILOSEC) 20 MG capsule 1 PO 30 mins prior to breakfast   . valsartan-hydrochlorothiazide (DIOVAN-HCT) 160-25 MG tablet Take 1 tablet by mouth daily.    No facility-administered encounter medications on file as of 08/20/2017.     ALLERGIES:  No Known Allergies   PHYSICAL EXAM:  ECOG Performance status: 1 His vitals were reviewed.    LABORATORY DATA:  I have reviewed the labs as listed.  CBC    Component Value Date/Time   WBC 6.8 08/18/2017 1355   RBC 3.84 (L) 08/18/2017 1355   HGB 11.5 (L) 08/18/2017 1355   HCT 35.6 (L) 08/18/2017 1355   PLT 216 08/18/2017 1355   MCV 92.7 08/18/2017 1355   MCH 29.9 08/18/2017 1355   MCHC 32.3 08/18/2017 1355   RDW 17.0 (H) 08/18/2017 1355   LYMPHSABS 2.3 08/18/2017 1355   MONOABS 0.6 08/18/2017 1355   EOSABS 0.1 08/18/2017 1355   BASOSABS 0.0 08/18/2017 1355   CMP Latest Ref Rng & Units 08/18/2017 08/06/2017 07/23/2017  Glucose 65 - 99 mg/dL 139(H) 349(H) 493(H)  BUN 6 - 20 mg/dL '14 14 16  ' Creatinine 0.61 - 1.24 mg/dL 0.99 0.97 1.10  Sodium 135 - 145 mmol/L 134(L) 134(L) 130(L)  Potassium 3.5 - 5.1 mmol/L 3.8 4.2 4.0  Chloride 101 - 111 mmol/L 98(L) 98(L) 92(L)  CO2 22 - 32 mmol/L '27 25 25  ' Calcium 8.9 - 10.3 mg/dL 9.1 9.3 9.9  Total Protein 6.5 - 8.1 g/dL 7.4 7.0 7.3  Total Bilirubin 0.3 - 1.2 mg/dL 0.6 0.5 1.0  Alkaline Phos 38 - 126 U/L 85 100 106  AST 15 - 41 U/L '22 23 23  ' ALT 17 - 63 U/L '24 22 23    ' DIAGNOSTIC IMAGING:  *I have independently reviewed the below diagnostic imaging results and agree with radiologic reports as listed.  CT chest/abd/pelvis: 08/18/17 CLINICAL DATA:   Metastatic colon adenocarcinoma to the liver, ongoing chemotherapy since 2016. Restaging assessment.  EXAM: CT CHEST, ABDOMEN, AND PELVIS WITH CONTRAST  TECHNIQUE: Multidetector CT imaging of the chest, abdomen and pelvis was performed following the standard protocol during bolus administration  of intravenous contrast.  CONTRAST:  148m ISOVUE-300 IOPAMIDOL (ISOVUE-300) INJECTION 61%  COMPARISON:  [facility] This described a 10 mm left basilar nodule and a 7.7 cm mass in the left hepatic lobe.  FINDINGS: CT CHEST FINDINGS  Cardiovascular: Right Port-A-Cath tip: SVC. Coronary, aortic arch, and branch vessel atherosclerotic vascular disease. Mild tortuosity of the thoracic aorta.  Mediastinum/Nodes: Small type 1 hiatal hernia. No pathologic adenopathy.  Lungs/Pleura: 4 mm left upper lobe nodule, image 39/3.  0.5 by 0.3 cm subpleural nodule along the right middle lobe side of the minor fissure, image 91/3.  0.3 cm right upper lobe nodule adjacent to the major fissure on image 91/3. 0.5 by 0.4 cm left lower lobe nodule on image 126/3.  Pleural surfaces unremarkable.  Musculoskeletal: Deformity in the right inferior glenoid neck along with a longitudinal gap in the right scapula suggesting remote prior scapular fracture. Lower cervical spondylosis.  CT ABDOMEN PELVIS FINDINGS  Hepatobiliary: Heterogeneous infiltrative hypodensity throughout the liver favoring geographic hepatic steatosis. No findings of portal vein thrombosis or hepatic vein thrombosis.  A lesion primarily segment 4 of the liver along the falciform ligament measures 4.7 by 2.9 by 3.8 cm on image 45/4 and probably represents metastatic disease.  A right hepatic lobe lesion near the junction of segments 5 and 8 measures 1.6 by 1.1 cm on image 8/8.  The hepatic steatosis may reduce sensitivity for additional small hepatic  metastatic lesions. Gallbladder unremarkable. No biliary dilatation.  Pancreas: Unremarkable  Spleen: Unremarkable  Adrenals/Urinary Tract: 8 mm nonspecific adrenal nodule on image 60/2. The kidneys in urinary bladder appear unremarkable.  Stomach/Bowel: Equivocal wall thickening in the lower rectum. Prominent stool throughout the colon favors constipation. Filling defect along the central lumen of the cecum favors stool material over polyp.  A right groin hernia is present and contains loops of small bowel without strangulation or obstruction. The hernia is primarily medial to the inferior epigastric vessels and there is some potential mild associated narrowing of the right femoral vein along the hernia. Accordingly this could be a femoral hernia or a direct inguinal hernia. There is also a small direct left inguinal hernia containing adipose tissue.  Along the posterior margin of the sigmoid colon on image 98/2 there is an ill-defined focal soft tissue density measuring about 1.1 cm in diameter with some faint rim calcification or contrast, potentially a diverticulum or a lymph node. Similarly, along the right side of the sigmoid colon on image 100/2 there is a 1.7 by 1.2 cm density which could be a paracolic lymph node or a diverticulum.  Vascular/Lymphatic: Aortoiliac atherosclerotic vascular disease. Small porta hepatis lymph nodes are not pathologically enlarged.  Reproductive: Unremarkable  Other: No supplemental non-categorized findings.  Musculoskeletal: Bilateral groin hernias discussed above. Degenerative disc disease and Schmorl's nodes at L4-5.  IMPRESSION: 1. The only left lower lobe nodule currently seen measures 0.5 by 0.4 cm and was described in an outside report as being 1.0 cm, suggesting improvement. Moreover, the left hepatic lobe mass measures 4.7 cm in long axis, previously described as 7.7 cm in the outside report. This appearance would  suggest improvement. 2. Several additional small pulmonary nodules are present as detailed above. There is also a 1.6 cm hepatic lesion near the junction of segments 5 and 8 of the right hepatic lobe. Without prior exams I cannot be sure whether these are new, or simply chronic findings of little significance. 3. Heterogeneous infiltrative hypodensity throughout  the liver favoring geographic hepatic steatosis. 4. Two areas of paracolic density along the sigmoid colon may represent dense diverticula versus small paracolic lymph nodes. 5. Other imaging findings of potential clinical significance: Aortic Atherosclerosis (ICD10-I70.0). Coronary atherosclerosis. Small type 1 hiatal hernia. Old right scapular deformity. Small nonspecific right adrenal nodule merit surveillance. Equivocal wall thickening in the lower rectum. Prominent stool throughout the colon favors constipation. Right groin hernia containing small bowel loops without strangulation or obstruction, potentially a femoral hernia or direct inguinal hernia. Small direct left inguinal hernia. Degenerative disc disease at L4-5.   Electronically Signed   By: Van Clines M.D.   On: 08/19/2017 08:27      ASSESSMENT & PLAN:   Adenocarcinoma of colon metastatic to liver ([place]) 1.  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 -CEA has come down to 5.8.  I have discussed the results of the CT scan of the chest, abdomen and pelvis which showed decrease in left lower lobe lung nodule to 0.5 cm, previously 1 cm and decrease in the left hepatic lobe mass to 4.7 cm, previously 7.7 cm. - He is tolerating current therapy very well.  We will proceed with same doses of chemotherapy.  We plan to rescan him after 4-6 cycles.  He is agreeable to this plan.  He usually gets slight constipation one day after each treatment.  He will take stool  softeners.  2.  Diabetes: He was taking metformin 500 mg once daily mostly.  If he takes twice daily he gets diarrhea.  He was started on glipizide daily.  His fasting blood sugars are in the range of 200.  He is also on Lipitor.  3.  Peripheral neuropathy: Numbness in the feet, predominantly at bedtime is stable.  4.  Hypertension: He will continue valsartan HCTZ 160-44m daily.      Orders placed this encounter:  Orders Placed This Encounter  Procedures  . CBC with Differential  . Comprehensive metabolic panel  . CEA      SDerek Jack MD [id number] [phone]

## 2017-08-22 ENCOUNTER — Inpatient Hospital Stay (HOSPITAL_COMMUNITY): Payer: Medicare PPO

## 2017-08-22 ENCOUNTER — Encounter (HOSPITAL_COMMUNITY): Payer: Self-pay

## 2017-08-22 VITALS — BP 114/78 | HR 65 | Temp 97.9°F | Resp 18

## 2017-08-22 DIAGNOSIS — Z5111 Encounter for antineoplastic chemotherapy: Secondary | ICD-10-CM

## 2017-08-22 DIAGNOSIS — C787 Secondary malignant neoplasm of liver and intrahepatic bile duct: Secondary | ICD-10-CM

## 2017-08-22 DIAGNOSIS — C189 Malignant neoplasm of colon, unspecified: Secondary | ICD-10-CM | POA: Diagnosis not present

## 2017-08-22 DIAGNOSIS — C7800 Secondary malignant neoplasm of unspecified lung: Secondary | ICD-10-CM | POA: Diagnosis not present

## 2017-08-22 DIAGNOSIS — Z452 Encounter for adjustment and management of vascular access device: Secondary | ICD-10-CM | POA: Diagnosis not present

## 2017-08-22 MED ORDER — HEPARIN SOD (PORK) LOCK FLUSH 100 UNIT/ML IV SOLN: 500.0000 [IU] | Freq: Once | INTRAVENOUS | Status: DC | PRN

## 2017-08-22 MED ORDER — HEPARIN SOD (PORK) LOCK FLUSH 100 UNIT/ML IV SOLN
500.0000 [IU] | Freq: Once | INTRAVENOUS | Status: AC
  Administered 2017-08-22: 500 [IU] via INTRAVENOUS

## 2017-08-22 MED ORDER — SODIUM CHLORIDE 0.9% FLUSH
10.0000 mL | Freq: Once | INTRAVENOUS | Status: AC
  Administered 2017-08-22: 10 mL via INTRAVENOUS

## 2017-08-22 MED ORDER — SODIUM CHLORIDE 0.9% FLUSH: 10.0000 mL | INTRAVENOUS | Status: DC | PRN

## 2017-08-22 NOTE — Patient Instructions (Signed)
[place] at [facility]  Discharge Instructions:  Your chemotherapy pump was disconnected today.  Call for any problems, fevers, pain, or any other symptom you may have.  _______________________________________________________________  Thank you for choosing [place] at [facility] to provide your oncology and hematology care.  To afford each patient quality time with our providers, please arrive at least 15 minutes before your scheduled appointment.  You need to re-schedule your appointment if you arrive 10 or more minutes late.  We strive to give you quality time with our providers, and arriving late affects you and other patients whose appointments are after yours.  Also, if you no show three or more times for appointments you may be dismissed from the clinic.  Again, thank you for choosing [place] [facility] [place] hope is that these requests will allow you access to exceptional care and in a timely manner. _______________________________________________________________  If you have questions after your visit, please contact our office at [phone] between the hours of 8:30 a.m. and 5:00 p.m. Voicemails left after 4:30 p.m. will not be returned until the following business day. _______________________________________________________________  For prescription refill requests, have your pharmacy contact our office. _______________________________________________________________  Recommendations made by the consultant and any test results will be sent to your referring physician. _______________________________________________________________

## 2017-08-22 NOTE — Progress Notes (Signed)
Patient tolerated chemotherapy pump with no complaints voiced.  Port site clean and dry with no bruising or swelling noted at site.  No complaints of pain with flush.  Band aid applied.  VSS with discharge and left ambulatory with no s/s of distress noted.

## 2017-09-02 ENCOUNTER — Inpatient Hospital Stay (HOSPITAL_COMMUNITY): Payer: Medicare PPO | Attending: Hematology

## 2017-09-02 DIAGNOSIS — Z5111 Encounter for antineoplastic chemotherapy: Secondary | ICD-10-CM | POA: Insufficient documentation

## 2017-09-02 DIAGNOSIS — C189 Malignant neoplasm of colon, unspecified: Secondary | ICD-10-CM | POA: Diagnosis not present

## 2017-09-02 DIAGNOSIS — C787 Secondary malignant neoplasm of liver and intrahepatic bile duct: Secondary | ICD-10-CM | POA: Insufficient documentation

## 2017-09-02 DIAGNOSIS — Z452 Encounter for adjustment and management of vascular access device: Secondary | ICD-10-CM | POA: Insufficient documentation

## 2017-09-02 DIAGNOSIS — C7800 Secondary malignant neoplasm of unspecified lung: Secondary | ICD-10-CM | POA: Insufficient documentation

## 2017-09-02 LAB — COMPREHENSIVE METABOLIC PANEL
ALK PHOS: 80 U/L (ref 38–126)
ALT: 22 U/L (ref 17–63)
AST: 24 U/L (ref 15–41)
Albumin: 4.1 g/dL (ref 3.5–5.0)
Anion gap: 7 (ref 5–15)
BILIRUBIN TOTAL: 0.7 mg/dL (ref 0.3–1.2)
BUN: 21 mg/dL — AB (ref 6–20)
CALCIUM: 9.5 mg/dL (ref 8.9–10.3)
CO2: 29 mmol/L (ref 22–32)
CREATININE: 1.16 mg/dL (ref 0.61–1.24)
Chloride: 100 mmol/L — ABNORMAL LOW (ref 101–111)
GFR calc Af Amer: >60 mL/min (ref >60)
GFR calc non Af Amer: >60 mL/min (ref >60)
GLUCOSE: 202 mg/dL — AB (ref 65–99)
Potassium: 4.1 mmol/L (ref 3.5–5.1)
Sodium: 136 mmol/L (ref 135–145)
TOTAL PROTEIN: 7.3 g/dL (ref 6.5–8.1)

## 2017-09-02 LAB — CBC WITH DIFFERENTIAL/PLATELET
BASOS ABS: 0 10*3/uL (ref 0.0–0.1)
Basophils Relative: 0 %
EOS PCT: 1 %
Eosinophils Absolute: 0.1 10*3/uL (ref 0.0–0.7)
HEMATOCRIT: 36.6 % — AB (ref 39.0–52.0)
HEMOGLOBIN: 11.9 g/dL — AB (ref 13.0–17.0)
LYMPHS ABS: 2 10*3/uL (ref 0.7–4.0)
Lymphocytes Relative: 28 %
MCH: 30.5 pg (ref 26.0–34.0)
MCHC: 32.5 g/dL (ref 30.0–36.0)
MCV: 93.8 fL (ref 78.0–100.0)
MONO ABS: 0.4 10*3/uL (ref 0.1–1.0)
MONOS PCT: 6 %
Neutro Abs: 4.4 10*3/uL (ref 1.7–7.7)
Neutrophils Relative %: 65 %
Platelets: 229 10*3/uL (ref 150–400)
RBC: 3.9 MIL/uL — ABNORMAL LOW (ref 4.22–5.81)
RDW: 17.4 % — ABNORMAL HIGH (ref 11.5–15.5)
WBC: 6.9 10*3/uL (ref 4.0–10.5)

## 2017-09-03 ENCOUNTER — Encounter (HOSPITAL_COMMUNITY): Payer: Self-pay | Admitting: Hematology

## 2017-09-03 ENCOUNTER — Other Ambulatory Visit: Payer: Self-pay

## 2017-09-03 ENCOUNTER — Encounter (HOSPITAL_COMMUNITY): Payer: Self-pay

## 2017-09-03 ENCOUNTER — Inpatient Hospital Stay (HOSPITAL_BASED_OUTPATIENT_CLINIC_OR_DEPARTMENT_OTHER): Payer: Medicare PPO | Admitting: Hematology

## 2017-09-03 ENCOUNTER — Inpatient Hospital Stay (HOSPITAL_COMMUNITY): Payer: Medicare PPO

## 2017-09-03 VITALS — BP 124/73 | HR 66 | Temp 98.6°F | Resp 16 | Ht 72.0 in | Wt 213.0 lb

## 2017-09-03 DIAGNOSIS — C189 Malignant neoplasm of colon, unspecified: Secondary | ICD-10-CM

## 2017-09-03 DIAGNOSIS — C787 Secondary malignant neoplasm of liver and intrahepatic bile duct: Secondary | ICD-10-CM

## 2017-09-03 DIAGNOSIS — C7800 Secondary malignant neoplasm of unspecified lung: Secondary | ICD-10-CM | POA: Diagnosis not present

## 2017-09-03 DIAGNOSIS — E1142 Type 2 diabetes mellitus with diabetic polyneuropathy: Secondary | ICD-10-CM | POA: Diagnosis not present

## 2017-09-03 DIAGNOSIS — Z7984 Long term (current) use of oral hypoglycemic drugs: Secondary | ICD-10-CM

## 2017-09-03 DIAGNOSIS — I10 Essential (primary) hypertension: Secondary | ICD-10-CM | POA: Diagnosis not present

## 2017-09-03 DIAGNOSIS — Z5111 Encounter for antineoplastic chemotherapy: Secondary | ICD-10-CM | POA: Diagnosis not present

## 2017-09-03 DIAGNOSIS — Z452 Encounter for adjustment and management of vascular access device: Secondary | ICD-10-CM | POA: Diagnosis not present

## 2017-09-03 LAB — CEA: CEA1: 4.4 ng/mL (ref 0.0–4.7)

## 2017-09-03 MED ORDER — PALONOSETRON HCL INJECTION 0.25 MG/5ML
0.2500 mg | Freq: Once | INTRAVENOUS | Status: AC
  Administered 2017-09-03: 0.25 mg via INTRAVENOUS

## 2017-09-03 MED ORDER — ATROPINE SULFATE 1 MG/ML IJ SOLN: 0.5000 mg | Freq: Once | INTRAMUSCULAR | Status: AC | PRN

## 2017-09-03 MED ORDER — SODIUM CHLORIDE 0.9 % IV SOLN
1920.0000 mg/m2 | INTRAVENOUS | Status: DC
  Administered 2017-09-03: 4250 mg via INTRAVENOUS
  Filled 2017-09-03: qty 85

## 2017-09-03 MED ORDER — SODIUM CHLORIDE 0.9 % IV SOLN
900.0000 mg | Freq: Once | INTRAVENOUS | Status: AC
  Administered 2017-09-03: 900 mg via INTRAVENOUS
  Filled 2017-09-03: qty 35

## 2017-09-03 MED ORDER — ATROPINE SULFATE 1 MG/ML IJ SOLN
INTRAMUSCULAR | Status: AC
  Filled 2017-09-03: qty 1

## 2017-09-03 MED ORDER — DEXAMETHASONE SODIUM PHOSPHATE 10 MG/ML IJ SOLN
INTRAMUSCULAR | Status: AC
  Filled 2017-09-03: qty 1

## 2017-09-03 MED ORDER — SODIUM CHLORIDE 0.9 % IV SOLN
Freq: Once | INTRAVENOUS | Status: AC
  Administered 2017-09-03: 10:00:00 via INTRAVENOUS

## 2017-09-03 MED ORDER — ATROPINE SULFATE 1 MG/ML IJ SOLN
0.5000 mg | Freq: Once | INTRAMUSCULAR | Status: AC
  Administered 2017-09-03: 0.5 mg via INTRAVENOUS

## 2017-09-03 MED ORDER — SODIUM CHLORIDE 0.9 % IV SOLN
144.0000 mg/m2 | Freq: Once | INTRAVENOUS | Status: AC
  Administered 2017-09-03: 320 mg via INTRAVENOUS
  Filled 2017-09-03: qty 15

## 2017-09-03 MED ORDER — DEXAMETHASONE SODIUM PHOSPHATE 10 MG/ML IJ SOLN
10.0000 mg | Freq: Once | INTRAMUSCULAR | Status: AC
  Administered 2017-09-03: 10 mg via INTRAVENOUS

## 2017-09-03 MED ORDER — FLUOROURACIL CHEMO INJECTION 2.5 GM/50ML
320.0000 mg/m2 | Freq: Once | INTRAVENOUS | Status: AC
  Administered 2017-09-03: 700 mg via INTRAVENOUS
  Filled 2017-09-03: qty 14

## 2017-09-03 MED ORDER — SODIUM CHLORIDE 0.9% FLUSH
10.0000 mL | INTRAVENOUS | Status: DC | PRN
  Administered 2017-09-03: 10 mL
  Filled 2017-09-03: qty 10

## 2017-09-03 MED ORDER — SODIUM CHLORIDE 0.9 % IV SOLN: 10.0000 mg | Freq: Once | INTRAVENOUS | Status: DC

## 2017-09-03 MED ORDER — PALONOSETRON HCL INJECTION 0.25 MG/5ML
INTRAVENOUS | Status: AC
  Filled 2017-09-03: qty 5

## 2017-09-03 MED ORDER — SODIUM CHLORIDE 0.9 % IV SOLN
900.0000 mg | Freq: Once | INTRAVENOUS | Status: DC
  Filled 2017-09-03: qty 45

## 2017-09-03 NOTE — Progress Notes (Signed)
Labs reviewed by Dr. Delton Coombes today. Proceed with treatment.  Continuous 5FU pump connected per protocol.  Treatment given per orders. Patient tolerated it well without problems. Vitals stable and discharged home from clinic ambulatory. Follow up as scheduled.

## 2017-09-03 NOTE — Assessment & Plan Note (Addendum)
1.  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  I have reviewed his labs.  He may proceed with cycle 8 today.  I plan to rescan him after 4 more cycles.  He will continue to use stool softeners for constipation as needed.  2.  Diabetes: He continues to take metformin 500 mg daily along with glipizide once daily.  He gets diarrhea if he increases the dose of metformin.  He is also continuing Lipitor.  3.  Peripheral neuropathy: Numbness in the feet, predominantly at bedtime is stable.  4.  Hypertension: He will continue valsartan HCTZ 160-88m daily.

## 2017-09-03 NOTE — Progress Notes (Signed)
[address] [place], [place] [address]   CLINIC:  Medical Oncology/Hematology  PCP:  [name], MD [place] [address] [phone]   REASON FOR VISIT:  Follow-up for metastatic colon cancer.  CURRENT THERAPY: FOLFIRI every 2 weeks.  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 ([place])   05/27/2017 -  Chemotherapy    The patient had palonosetron (ALOXI) injection 0.25 mg, 0.25 mg, Intravenous,  Once, 8 of 12 cycles irinotecan (CAMPTOSAR) 400 mg in dextrose 5 % 500 mL chemo infusion, 180 mg/m2, Intravenous,  Once, 8 of 12 cycles Dose modification: 144 mg/m2 (Cycle 4, Reason: Provider Judgment) leucovorin 888 mg in dextrose 5 % 250 mL infusion, 400 mg/m2, Intravenous,  Once, 8 of 12 cycles fluorouracil (ADRUCIL) chemo injection 900 mg, 400 mg/m2, Intravenous,  Once, 8 of 12 cycles Dose modification: 320 mg/m2 (Cycle 4, Reason: Provider Judgment) fluorouracil (ADRUCIL) 5,350 mg in sodium chloride 0.9 % 143 mL chemo infusion, 2,400 mg/m2, Intravenous, 1 Day/Dose, 8 of 12 cycles Dose modification: 1,920 mg/m2 (Cycle 4, Reason: Provider Judgment)  for chemotherapy treatment.       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 Shadwick 67 y.o. male returns for routine follow-up and consideration for next cycle of chemotherapy.  He is here for cycle 8 of chemotherapy.  He denies any abdominal pains.  He has mild constipation immediately following treatment.  He also has mild diarrhea and does not have to take Imodium.  Denies any abdominal pains.  Continues to work part-time.  Energy levels have been stable.  Denies any  fevers or infections.   REVIEW OF SYSTEMS:  Review of Systems  Constitutional: Positive for fatigue.  Gastrointestinal: Positive for constipation and diarrhea.  All other systems reviewed and are negative.    PAST MEDICAL/SURGICAL HISTORY:  Past Medical History:  Diagnosis Date  . Colon cancer ([place])   . Diabetes [facility])    Past Surgical History:  Procedure Laterality Date  . SHOULDER SURGERY  2003     SOCIAL HISTORY:  Social History   Socioeconomic History  . Marital status: Married    Spouse name: Not on file  . Number of children: Not on file  . Years of education: Not on file  . Highest education level: Not on file  Occupational History  . Not on file  Social Needs  . Financial resource strain: Not on file  . Food insecurity:    Worry: Not on file    Inability: Not on file  . Transportation needs:    Medical: Not on file    Non-medical: Not on file  Tobacco Use  . Smoking status: Former Smoker    Last attempt to quit: 04/05/1995    Years since quitting: 22.4  . Smokeless tobacco: Never Used  Substance and Sexual Activity  . Alcohol use: Not Currently    Alcohol/week: 0.0 oz    Comment: used to drink but stopped about 20 years ago  . Drug use: Never  . Sexual activity: Not on file  Lifestyle  . Physical activity:    Days per week: Not on  file    Minutes per session: Not on file  . Stress: Not on file  Relationships  . Social connections:    Talks on phone: Not on file    Gets together: Not on file    Attends religious service: Not on file    Active member of club or organization: Not on file    Attends meetings of clubs or organizations: Not on file    Relationship status: Not on file  . Intimate partner violence:    Fear of current or ex partner: Not on file    Emotionally abused: Not on file    Physically abused: Not on file    Forced sexual activity: Not on file  Other Topics Concern  . Not on file  Social History Narrative  . Not on file     FAMILY HISTORY:  Family History  Problem Relation Age of Onset  . Diabetes Mother   . Cancer Father   . Bone cancer Father   . Colon cancer Neg Hx   . Colon polyps Neg Hx     CURRENT MEDICATIONS:  Outpatient Encounter Medications as of 09/03/2017  Medication Sig Note  . Ascorbic Acid (VITAMIN C) 1000 MG tablet Take 1,000 mg by mouth daily.   Marland Kitchen aspirin 81 MG tablet Take 81 mg by mouth daily.   Marland Kitchen atorvastatin (LIPITOR) 10 MG tablet    . BEE POLLEN PO Take by mouth.   . Blood Glucose Monitoring Suppl (ONE TOUCH ULTRA MINI) W/DEVICE KIT USE TO CHECK BLOOD SUGAR DAILY. 04/05/2015: Received from: External Pharmacy  . Coenzyme Q10 (CO Q 10 PO) Take by mouth.   . erythromycin (E-MYCIN) 250 MG tablet TAKE TWO (2) TABLETS BY MOUTH AT 100 PM, 200 PM, AND 1000 PM PRIOR TO SURGERY 04/05/2015: Received from: External Pharmacy  . glipiZIDE (GLUCOTROL XL) 10 MG 24 hr tablet    . Glucosamine-Chondroit-Collagen ([employer] GLUCO-CHONDROIT PLUS UC-II PO) Take by mouth.   . magnesium oxide (MAG-OX) 400 MG tablet Take 400 mg by mouth 2 (two) times daily.   . metFORMIN (GLUCOPHAGE) 500 MG tablet Take 1 tablet (500 mg total) by mouth 2 (two) times daily with a meal.   . omeprazole (PRILOSEC) 20 MG capsule 1 PO 30 mins prior to breakfast   . valsartan-hydrochlorothiazide (DIOVAN-HCT) 160-25 MG tablet Take 1 tablet by mouth daily.    No facility-administered encounter medications on file as of 09/03/2017.     ALLERGIES:  No Known Allergies   PHYSICAL EXAM:  ECOG Performance status: 1  I have reviewed his vitals. Limited physical exam done.  Abdomen is soft nontender with no palpable masses.    LABORATORY DATA:  I have reviewed the labs as listed.  CBC    Component Value Date/Time   WBC 6.9 09/02/2017 1040   RBC 3.90 (L) 09/02/2017 1040   HGB 11.9 (L) 09/02/2017 1040   HCT 36.6 (L) 09/02/2017 1040   PLT 229 09/02/2017 1040   MCV 93.8 09/02/2017 1040   MCH 30.5 09/02/2017 1040   MCHC 32.5  09/02/2017 1040   RDW 17.4 (H) 09/02/2017 1040   LYMPHSABS 2.0 09/02/2017 1040   MONOABS 0.4 09/02/2017 1040   EOSABS 0.1 09/02/2017 1040   BASOSABS 0.0 09/02/2017 1040   CMP Latest Ref Rng & Units 09/02/2017 08/18/2017 08/06/2017  Glucose 65 - 99 mg/dL 202(H) 139(H) 349(H)  BUN 6 - 20 mg/dL 21(H) 14 14  Creatinine 0.61 - 1.24 mg/dL 1.16 0.99 0.97  Sodium 135 -  145 mmol/L 136 134(L) 134(L)  Potassium 3.5 - 5.1 mmol/L 4.1 3.8 4.2  Chloride 101 - 111 mmol/L 100(L) 98(L) 98(L)  CO2 22 - 32 mmol/L '29 27 25  ' Calcium 8.9 - 10.3 mg/dL 9.5 9.1 9.3  Total Protein 6.5 - 8.1 g/dL 7.3 7.4 7.0  Total Bilirubin 0.3 - 1.2 mg/dL 0.7 0.6 0.5  Alkaline Phos 38 - 126 U/L 80 85 100  AST 15 - 41 U/L '24 22 23  ' ALT 17 - 63 U/L '22 24 22    ' Results for MOSHE, WENGER (MRN [id number]) as of 09/03/2017 09:31  Ref. 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  I have reviewed his labs.  He may proceed with cycle 8 today.  I plan to rescan him after 4 more cycles.  He will continue to use stool softeners for constipation as needed.  2.  Diabetes: He continues to take metformin 500 mg daily along with glipizide once daily.  He gets diarrhea if he increases the dose of metformin.  He is also continuing Lipitor.  3.  Peripheral neuropathy: Numbness in the feet, predominantly at bedtime is stable.  4.  Hypertension: He will continue valsartan HCTZ 160-91m daily.     SDerek Jack MD [phone]

## 2017-09-03 NOTE — Patient Instructions (Signed)
[facility] Discharge Instructions for Patients Receiving Chemotherapy   Beginning January 23rd 2017 lab work for the Cancer Center will be done in the  Main lab at [facility] on 1st floor. If you have a lab appointment with the Cancer Center please come in thru the  Main Entrance and check in at the main information desk   Today you received the following chemotherapy agents   To help prevent nausea and vomiting after your treatment, we encourage you to take your nausea medication     If you develop nausea and vomiting, or diarrhea that is not controlled by your medication, call the clinic.  The clinic phone number is [phone]. Office hours are Monday-Friday 8:30am-5:00pm.  BELOW ARE SYMPTOMS THAT SHOULD BE REPORTED IMMEDIATELY:  *FEVER GREATER THAN 101.0 F  *CHILLS WITH OR WITHOUT FEVER  NAUSEA AND VOMITING THAT IS NOT CONTROLLED WITH YOUR NAUSEA MEDICATION  *UNUSUAL SHORTNESS OF BREATH  *UNUSUAL BRUISING OR BLEEDING  TENDERNESS IN MOUTH AND THROAT WITH OR WITHOUT PRESENCE OF ULCERS  *URINARY PROBLEMS  *BOWEL PROBLEMS  UNUSUAL RASH Items with * indicate a potential emergency and should be followed up as soon as possible. If you have an emergency after office hours please contact your primary care physician or go to the nearest emergency department.  Please call the clinic during office hours if you have any questions or concerns.   You may also contact the Patient Navigator at [phone] should you have any questions or need assistance in obtaining follow up care.      Resources For Cancer Patients and their Caregivers ? American Cancer Society: Can assist with transportation, wigs, general needs, runs Look Good Feel Better.        1-888-227-6333 ? Cancer Care: Provides financial assistance, online support groups, medication/co-pay assistance.  1-800-813-HOPE (4673) ? [facility] Assists [place] cancer  patients and their families through emotional , educational and financial support.  336-427-4357 ? [place] DSS Where to apply for food stamps, Medicaid and utility assistance. 336-342-1394 ? [facility]: Transportation to medical appointments. 336-347-2287 ? Social Security Administration: May apply for disability if have a Stage IV cancer. 336-342-7796 1-800-772-1213 ? [place] Aging, Disability and Transit Services: Assists with nutrition, care and transit needs. 336-349-2343         

## 2017-09-05 ENCOUNTER — Other Ambulatory Visit: Payer: Self-pay

## 2017-09-05 ENCOUNTER — Encounter (HOSPITAL_COMMUNITY): Payer: Self-pay

## 2017-09-05 ENCOUNTER — Inpatient Hospital Stay (HOSPITAL_COMMUNITY): Payer: Medicare PPO

## 2017-09-05 VITALS — BP 110/77 | HR 63 | Temp 98.7°F | Resp 18

## 2017-09-05 DIAGNOSIS — C7800 Secondary malignant neoplasm of unspecified lung: Secondary | ICD-10-CM | POA: Diagnosis not present

## 2017-09-05 DIAGNOSIS — C787 Secondary malignant neoplasm of liver and intrahepatic bile duct: Principal | ICD-10-CM

## 2017-09-05 DIAGNOSIS — Z5111 Encounter for antineoplastic chemotherapy: Secondary | ICD-10-CM | POA: Diagnosis not present

## 2017-09-05 DIAGNOSIS — C189 Malignant neoplasm of colon, unspecified: Secondary | ICD-10-CM

## 2017-09-05 DIAGNOSIS — Z452 Encounter for adjustment and management of vascular access device: Secondary | ICD-10-CM | POA: Diagnosis not present

## 2017-09-05 MED ORDER — SODIUM CHLORIDE 0.9% FLUSH
10.0000 mL | INTRAVENOUS | Status: DC | PRN
Start: 2017-09-05 — End: 2017-09-05
  Administered 2017-09-05: 10 mL
  Filled 2017-09-05: qty 10

## 2017-09-05 MED ORDER — HEPARIN SOD (PORK) LOCK FLUSH 100 UNIT/ML IV SOLN
500.0000 [IU] | Freq: Once | INTRAVENOUS | Status: AC | PRN
  Administered 2017-09-05: 500 [IU]

## 2017-09-05 NOTE — Progress Notes (Signed)
Christopher Friedman returns today for port de access and flush after 46 hr continous infusion of 5fu. Tolerated infusion without problems. Portacath located right chest wall was  deaccessed and flushed with 20ml NS and 500U/5ml Heparin and needle removed intact.  Procedure without incident. Patient tolerated procedure well.   Vitals stable and discharged home from clinic ambulatory. Follow up as scheduled.    

## 2017-09-05 NOTE — Patient Instructions (Signed)
[place] at [facility] Discharge Instructions  Disconnected continuous 5FU pump today Follow up as scheduled.   Thank you for choosing [place] at [facility] to provide your oncology and hematology care.  To afford each patient quality time with our provider, please arrive at least 15 minutes before your scheduled appointment time.   If you have a lab appointment with the [facility] please come in thru the  Main Entrance and check in at the main information desk  You need to re-schedule your appointment should you arrive 10 or more minutes late.  We strive to give you quality time with our providers, and arriving late affects you and other patients whose appointments are after yours.  Also, if you no show three or more times for appointments you may be dismissed from the clinic at the providers discretion.     Again, thank you for choosing [facility].  Our hope is that these requests will decrease the amount of time that you wait before being seen by our physicians.       _____________________________________________________________  Should you have questions after your visit to [facility], please contact our office at [phone] between the hours of 8:30 a.m. and 4:30 p.m.  Voicemails left after 4:30 p.m. will not be returned until the following business day.  For prescription refill requests, have your pharmacy contact our office.       Resources For Cancer Patients and their Caregivers ? American Cancer Society: Can assist with transportation, wigs, general needs, runs Look Good Feel Better.        972-374-8686 ? Cancer Care: Provides financial assistance, online support groups, medication/co-pay assistance.  1-800-813-HOPE 531-377-1318) ? [facility] Assists [place] cancer patients and their families through emotional , educational and financial support.   (413)133-8982 ? [place] DSS Where to apply for food stamps, Medicaid and utility assistance. 628-667-5909 ? [facility]: Transportation to medical appointments. 203-501-0782 ? Social Security Administration: May apply for disability if have a Stage IV cancer. 863-632-3271 616 749 8384 ? [employer], Disability and Transit Services: Assists with nutrition, care and transit needs. [facility] Support Programs:   > Cancer Support Group  2nd Tuesday of the month 1pm-2pm, Journey Room   > Creative Journey  3rd Tuesday of the month 1130am-1pm, Journey Room

## 2017-09-08 DIAGNOSIS — C189 Malignant neoplasm of colon, unspecified: Secondary | ICD-10-CM | POA: Diagnosis not present

## 2017-09-16 ENCOUNTER — Inpatient Hospital Stay (HOSPITAL_COMMUNITY): Payer: Medicare PPO

## 2017-09-16 ENCOUNTER — Other Ambulatory Visit (HOSPITAL_COMMUNITY): Payer: Self-pay

## 2017-09-16 DIAGNOSIS — C189 Malignant neoplasm of colon, unspecified: Secondary | ICD-10-CM | POA: Diagnosis not present

## 2017-09-16 DIAGNOSIS — C7800 Secondary malignant neoplasm of unspecified lung: Secondary | ICD-10-CM | POA: Diagnosis not present

## 2017-09-16 DIAGNOSIS — Z452 Encounter for adjustment and management of vascular access device: Secondary | ICD-10-CM | POA: Diagnosis not present

## 2017-09-16 DIAGNOSIS — C787 Secondary malignant neoplasm of liver and intrahepatic bile duct: Principal | ICD-10-CM

## 2017-09-16 DIAGNOSIS — Z5111 Encounter for antineoplastic chemotherapy: Secondary | ICD-10-CM | POA: Diagnosis not present

## 2017-09-16 LAB — CBC WITH DIFFERENTIAL/PLATELET
BASOS ABS: 0.1 10*3/uL (ref 0.0–0.1)
BASOS PCT: 1 %
EOS ABS: 0.1 10*3/uL (ref 0.0–0.7)
Eosinophils Relative: 1 %
HCT: 35.6 % — ABNORMAL LOW (ref 39.0–52.0)
Hemoglobin: 11.4 g/dL — ABNORMAL LOW (ref 13.0–17.0)
Lymphocytes Relative: 30 %
Lymphs Abs: 2.4 10*3/uL (ref 0.7–4.0)
MCH: 30.5 pg (ref 26.0–34.0)
MCHC: 32 g/dL (ref 30.0–36.0)
MCV: 95.2 fL (ref 78.0–100.0)
MONO ABS: 0.6 10*3/uL (ref 0.1–1.0)
MONOS PCT: 8 %
NEUTROS PCT: 60 %
Neutro Abs: 4.9 10*3/uL (ref 1.7–7.7)
Platelets: 223 10*3/uL (ref 150–400)
RBC: 3.74 MIL/uL — ABNORMAL LOW (ref 4.22–5.81)
RDW: 17.5 % — ABNORMAL HIGH (ref 11.5–15.5)
WBC: 8 10*3/uL (ref 4.0–10.5)

## 2017-09-16 LAB — COMPREHENSIVE METABOLIC PANEL
ALK PHOS: 82 U/L (ref 38–126)
ALT: 23 U/L (ref 17–63)
AST: 26 U/L (ref 15–41)
Albumin: 3.9 g/dL (ref 3.5–5.0)
Anion gap: 9 (ref 5–15)
BILIRUBIN TOTAL: 0.7 mg/dL (ref 0.3–1.2)
BUN: 24 mg/dL — ABNORMAL HIGH (ref 6–20)
CALCIUM: 9.1 mg/dL (ref 8.9–10.3)
CO2: 27 mmol/L (ref 22–32)
CREATININE: 1.27 mg/dL — AB (ref 0.61–1.24)
Chloride: 100 mmol/L — ABNORMAL LOW (ref 101–111)
GFR calc non Af Amer: 57 mL/min — ABNORMAL LOW (ref >60)
GLUCOSE: 121 mg/dL — AB (ref 65–99)
Potassium: 4.1 mmol/L (ref 3.5–5.1)
SODIUM: 136 mmol/L (ref 135–145)
TOTAL PROTEIN: 7 g/dL (ref 6.5–8.1)

## 2017-09-16 NOTE — Progress Notes (Signed)
Lab called and said that the orders for patient were entered wrong and needed to be reordered. New orders placed.

## 2017-09-17 ENCOUNTER — Inpatient Hospital Stay (HOSPITAL_COMMUNITY): Payer: Medicare PPO

## 2017-09-17 VITALS — BP 125/75 | HR 58 | Temp 97.9°F | Resp 18 | Wt 214.0 lb

## 2017-09-17 DIAGNOSIS — Z452 Encounter for adjustment and management of vascular access device: Secondary | ICD-10-CM | POA: Diagnosis not present

## 2017-09-17 DIAGNOSIS — C7800 Secondary malignant neoplasm of unspecified lung: Secondary | ICD-10-CM | POA: Diagnosis not present

## 2017-09-17 DIAGNOSIS — Z5111 Encounter for antineoplastic chemotherapy: Secondary | ICD-10-CM | POA: Diagnosis not present

## 2017-09-17 DIAGNOSIS — C189 Malignant neoplasm of colon, unspecified: Secondary | ICD-10-CM | POA: Diagnosis not present

## 2017-09-17 DIAGNOSIS — C787 Secondary malignant neoplasm of liver and intrahepatic bile duct: Principal | ICD-10-CM

## 2017-09-17 MED ORDER — ATROPINE SULFATE 1 MG/ML IJ SOLN
0.5000 mg | Freq: Once | INTRAMUSCULAR | Status: AC
  Administered 2017-09-17: 0.5 mg via INTRAVENOUS
  Filled 2017-09-17: qty 1

## 2017-09-17 MED ORDER — ATROPINE SULFATE 1 MG/ML IJ SOLN: 0.5000 mg | Freq: Once | INTRAMUSCULAR | Status: DC | PRN

## 2017-09-17 MED ORDER — PALONOSETRON HCL INJECTION 0.25 MG/5ML
0.2500 mg | Freq: Once | INTRAVENOUS | Status: AC
  Administered 2017-09-17: 0.25 mg via INTRAVENOUS
  Filled 2017-09-17: qty 5

## 2017-09-17 MED ORDER — SODIUM CHLORIDE 0.9 % IV SOLN: 10.0000 mg | Freq: Once | INTRAVENOUS | Status: DC

## 2017-09-17 MED ORDER — LEUCOVORIN CALCIUM INJECTION 350 MG
900.0000 mg | Freq: Once | INTRAVENOUS | Status: AC
  Administered 2017-09-17: 900 mg via INTRAVENOUS
  Filled 2017-09-17: qty 35

## 2017-09-17 MED ORDER — IRINOTECAN HCL CHEMO INJECTION 100 MG/5ML
144.0000 mg/m2 | Freq: Once | INTRAVENOUS | Status: AC
  Administered 2017-09-17: 320 mg via INTRAVENOUS
  Filled 2017-09-17: qty 1

## 2017-09-17 MED ORDER — SODIUM CHLORIDE 0.9 % IV SOLN
1920.0000 mg/m2 | INTRAVENOUS | Status: DC
  Administered 2017-09-17: 4250 mg via INTRAVENOUS
  Filled 2017-09-17: qty 85

## 2017-09-17 MED ORDER — DEXAMETHASONE SODIUM PHOSPHATE 10 MG/ML IJ SOLN
10.0000 mg | Freq: Once | INTRAMUSCULAR | Status: AC
  Administered 2017-09-17: 10 mg via INTRAVENOUS
  Filled 2017-09-17: qty 1

## 2017-09-17 MED ORDER — FLUOROURACIL CHEMO INJECTION 2.5 GM/50ML
320.0000 mg/m2 | Freq: Once | INTRAVENOUS | Status: AC
  Administered 2017-09-17: 700 mg via INTRAVENOUS
  Filled 2017-09-17: qty 14

## 2017-09-17 MED ORDER — SODIUM CHLORIDE 0.9 % IV SOLN
Freq: Once | INTRAVENOUS | Status: AC
  Administered 2017-09-17: 10:00:00 via INTRAVENOUS

## 2017-09-17 NOTE — Progress Notes (Signed)
Tolerated infusion w/o adverse reaction. Alert, in no distress.  VSS.  Discharged ambulatory in c/o family with ambulatory pump infusing.

## 2017-09-18 LAB — CEA: CEA1: 3.4 ng/mL (ref 0.0–4.7)

## 2017-09-19 ENCOUNTER — Inpatient Hospital Stay (HOSPITAL_COMMUNITY): Payer: Medicare PPO

## 2017-09-19 ENCOUNTER — Encounter (HOSPITAL_COMMUNITY): Payer: Self-pay

## 2017-09-19 VITALS — BP 109/74 | HR 64 | Temp 97.8°F | Resp 18

## 2017-09-19 DIAGNOSIS — C787 Secondary malignant neoplasm of liver and intrahepatic bile duct: Principal | ICD-10-CM

## 2017-09-19 DIAGNOSIS — Z452 Encounter for adjustment and management of vascular access device: Secondary | ICD-10-CM | POA: Diagnosis not present

## 2017-09-19 DIAGNOSIS — C189 Malignant neoplasm of colon, unspecified: Secondary | ICD-10-CM

## 2017-09-19 DIAGNOSIS — Z5111 Encounter for antineoplastic chemotherapy: Secondary | ICD-10-CM | POA: Diagnosis not present

## 2017-09-19 DIAGNOSIS — C7800 Secondary malignant neoplasm of unspecified lung: Secondary | ICD-10-CM | POA: Diagnosis not present

## 2017-09-19 MED ORDER — HEPARIN SOD (PORK) LOCK FLUSH 100 UNIT/ML IV SOLN
500.0000 [IU] | Freq: Once | INTRAVENOUS | Status: AC | PRN
  Administered 2017-09-19: 500 [IU]

## 2017-09-19 MED ORDER — SODIUM CHLORIDE 0.9% FLUSH
10.0000 mL | INTRAVENOUS | Status: DC | PRN
  Administered 2017-09-19: 10 mL
  Filled 2017-09-19: qty 10

## 2017-09-19 NOTE — Patient Instructions (Signed)
[facility] at [facility]  Discharge Instructions:  Your chemotherapy pump was removed today.  _______________________________________________________________  Thank you for choosing [facility] at [facility] to provide your oncology and hematology care.  To afford each patient quality time with our providers, please arrive at least 15 minutes before your scheduled appointment.  You need to re-schedule your appointment if you arrive 10 or more minutes late.  We strive to give you quality time with our providers, and arriving late affects you and other patients whose appointments are after yours.  Also, if you no show three or more times for appointments you may be dismissed from the clinic.  Again, thank you for choosing [facility] at [facility]. Our hope is that these requests will allow you access to exceptional care and in a timely manner. _______________________________________________________________  If you have questions after your visit, please contact our office at [phone] between the hours of 8:30 a.m. and 5:00 p.m. Voicemails left after 4:30 p.m. will not be returned until the following business day. _______________________________________________________________  For prescription refill requests, have your pharmacy contact our office. _______________________________________________________________  Recommendations made by the consultant and any test results will be sent to your referring physician. _______________________________________________________________ 

## 2017-09-19 NOTE — Progress Notes (Signed)
Patient in for chemotherapy pump to be removed.  Stated prn abdominal cramping that is not new with pump.  No problems with diarrhea.  Port site clean and dry with no bruising or swelling noted at site.  Good blood return noted with port.  Flushed easily with no complaints of pain.  Band aid applied.  VSS with discharge and left ambulatory with no s/s of distress noted.

## 2017-09-30 ENCOUNTER — Inpatient Hospital Stay (HOSPITAL_COMMUNITY): Payer: Medicare PPO | Attending: Hematology

## 2017-09-30 DIAGNOSIS — C189 Malignant neoplasm of colon, unspecified: Secondary | ICD-10-CM | POA: Diagnosis not present

## 2017-09-30 DIAGNOSIS — C7800 Secondary malignant neoplasm of unspecified lung: Secondary | ICD-10-CM | POA: Insufficient documentation

## 2017-09-30 DIAGNOSIS — C787 Secondary malignant neoplasm of liver and intrahepatic bile duct: Secondary | ICD-10-CM | POA: Insufficient documentation

## 2017-09-30 DIAGNOSIS — Z5111 Encounter for antineoplastic chemotherapy: Secondary | ICD-10-CM | POA: Diagnosis not present

## 2017-09-30 DIAGNOSIS — Z452 Encounter for adjustment and management of vascular access device: Secondary | ICD-10-CM | POA: Diagnosis not present

## 2017-09-30 LAB — COMPREHENSIVE METABOLIC PANEL
ALK PHOS: 78 U/L (ref 38–126)
ALT: 25 U/L (ref 17–63)
ANION GAP: 8 (ref 5–15)
AST: 27 U/L (ref 15–41)
Albumin: 4 g/dL (ref 3.5–5.0)
BILIRUBIN TOTAL: 0.6 mg/dL (ref 0.3–1.2)
BUN: 12 mg/dL (ref 6–20)
CALCIUM: 9.4 mg/dL (ref 8.9–10.3)
CO2: 29 mmol/L (ref 22–32)
CREATININE: 1.07 mg/dL (ref 0.61–1.24)
Chloride: 101 mmol/L (ref 101–111)
GFR calc non Af Amer: >60 mL/min (ref >60)
Glucose, Bld: 147 mg/dL — ABNORMAL HIGH (ref 65–99)
Potassium: 4.2 mmol/L (ref 3.5–5.1)
Sodium: 138 mmol/L (ref 135–145)
TOTAL PROTEIN: 7.1 g/dL (ref 6.5–8.1)

## 2017-10-01 ENCOUNTER — Encounter (HOSPITAL_COMMUNITY): Payer: Self-pay | Admitting: Hematology

## 2017-10-01 ENCOUNTER — Inpatient Hospital Stay (HOSPITAL_COMMUNITY): Payer: Medicare PPO

## 2017-10-01 ENCOUNTER — Encounter (HOSPITAL_COMMUNITY): Payer: Self-pay

## 2017-10-01 ENCOUNTER — Inpatient Hospital Stay (HOSPITAL_BASED_OUTPATIENT_CLINIC_OR_DEPARTMENT_OTHER): Payer: Medicare PPO | Admitting: Hematology

## 2017-10-01 ENCOUNTER — Other Ambulatory Visit: Payer: Self-pay

## 2017-10-01 VITALS — BP 132/68 | HR 77 | Temp 97.6°F | Resp 16 | Wt 211.6 lb

## 2017-10-01 DIAGNOSIS — C189 Malignant neoplasm of colon, unspecified: Secondary | ICD-10-CM | POA: Diagnosis not present

## 2017-10-01 DIAGNOSIS — C787 Secondary malignant neoplasm of liver and intrahepatic bile duct: Principal | ICD-10-CM

## 2017-10-01 DIAGNOSIS — R5383 Other fatigue: Secondary | ICD-10-CM

## 2017-10-01 DIAGNOSIS — Z5111 Encounter for antineoplastic chemotherapy: Secondary | ICD-10-CM | POA: Diagnosis not present

## 2017-10-01 DIAGNOSIS — Z452 Encounter for adjustment and management of vascular access device: Secondary | ICD-10-CM | POA: Diagnosis not present

## 2017-10-01 DIAGNOSIS — E1142 Type 2 diabetes mellitus with diabetic polyneuropathy: Secondary | ICD-10-CM | POA: Diagnosis not present

## 2017-10-01 DIAGNOSIS — I10 Essential (primary) hypertension: Secondary | ICD-10-CM

## 2017-10-01 DIAGNOSIS — C7800 Secondary malignant neoplasm of unspecified lung: Secondary | ICD-10-CM

## 2017-10-01 DIAGNOSIS — Z87891 Personal history of nicotine dependence: Secondary | ICD-10-CM

## 2017-10-01 LAB — CBC WITH DIFFERENTIAL/PLATELET
BASOS ABS: 0 10*3/uL (ref 0.0–0.1)
BASOS PCT: 1 %
Eosinophils Absolute: 0 10*3/uL (ref 0.0–0.7)
Eosinophils Relative: 1 %
HEMATOCRIT: 35.3 % — AB (ref 39.0–52.0)
HEMOGLOBIN: 11.3 g/dL — AB (ref 13.0–17.0)
LYMPHS PCT: 32 %
Lymphs Abs: 1.6 10*3/uL (ref 0.7–4.0)
MCH: 30.7 pg (ref 26.0–34.0)
MCHC: 32 g/dL (ref 30.0–36.0)
MCV: 95.9 fL (ref 78.0–100.0)
Monocytes Absolute: 0.4 10*3/uL (ref 0.1–1.0)
Monocytes Relative: 8 %
NEUTROS ABS: 3.1 10*3/uL (ref 1.7–7.7)
NEUTROS PCT: 58 %
Platelets: 207 10*3/uL (ref 150–400)
RBC: 3.68 MIL/uL — AB (ref 4.22–5.81)
RDW: 17.2 % — AB (ref 11.5–15.5)
WBC: 5.2 10*3/uL (ref 4.0–10.5)

## 2017-10-01 LAB — CEA: CEA1: 3.4 ng/mL (ref 0.0–4.7)

## 2017-10-01 MED ORDER — DEXAMETHASONE SODIUM PHOSPHATE 10 MG/ML IJ SOLN
10.0000 mg | Freq: Once | INTRAMUSCULAR | Status: AC
  Administered 2017-10-01: 10 mg via INTRAVENOUS

## 2017-10-01 MED ORDER — ATROPINE SULFATE 1 MG/ML IJ SOLN: 0.5000 mg | Freq: Once | INTRAMUSCULAR | Status: AC | PRN

## 2017-10-01 MED ORDER — IRINOTECAN HCL CHEMO INJECTION 100 MG/5ML
144.0000 mg/m2 | Freq: Once | INTRAVENOUS | Status: AC
  Administered 2017-10-01: 320 mg via INTRAVENOUS
  Filled 2017-10-01: qty 1

## 2017-10-01 MED ORDER — FLUOROURACIL CHEMO INJECTION 2.5 GM/50ML
320.0000 mg/m2 | Freq: Once | INTRAVENOUS | Status: AC
  Administered 2017-10-01: 700 mg via INTRAVENOUS
  Filled 2017-10-01: qty 14

## 2017-10-01 MED ORDER — SODIUM CHLORIDE 0.9 % IV SOLN: 10.0000 mg | Freq: Once | INTRAVENOUS | Status: DC

## 2017-10-01 MED ORDER — DEXAMETHASONE SODIUM PHOSPHATE 10 MG/ML IJ SOLN
INTRAMUSCULAR | Status: AC
  Filled 2017-10-01: qty 1

## 2017-10-01 MED ORDER — ATROPINE SULFATE 1 MG/ML IJ SOLN
0.5000 mg | Freq: Once | INTRAMUSCULAR | Status: AC
  Administered 2017-10-01: 0.5 mg via INTRAVENOUS
  Filled 2017-10-01: qty 1

## 2017-10-01 MED ORDER — LEUCOVORIN CALCIUM INJECTION 350 MG
900.0000 mg | Freq: Once | INTRAVENOUS | Status: AC
  Administered 2017-10-01: 900 mg via INTRAVENOUS
  Filled 2017-10-01: qty 35

## 2017-10-01 MED ORDER — SODIUM CHLORIDE 0.9 % IV SOLN
Freq: Once | INTRAVENOUS | Status: AC
  Administered 2017-10-01: 10:00:00 via INTRAVENOUS

## 2017-10-01 MED ORDER — SODIUM CHLORIDE 0.9 % IV SOLN
1920.0000 mg/m2 | INTRAVENOUS | Status: DC
  Administered 2017-10-01: 4250 mg via INTRAVENOUS
  Filled 2017-10-01: qty 85

## 2017-10-01 MED ORDER — PALONOSETRON HCL INJECTION 0.25 MG/5ML
0.2500 mg | Freq: Once | INTRAVENOUS | Status: AC
  Administered 2017-10-01: 0.25 mg via INTRAVENOUS
  Filled 2017-10-01: qty 5

## 2017-10-01 NOTE — Assessment & Plan Note (Signed)
1.  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  Last CEA level has come down to 3.4.  He will proceed with cycle 10 today.  After 12 cycles, I will repeat CT scan of his chest, abdomen and pelvis to evaluate response.  2.  Diabetes: He continues to take metformin 500 mg daily along with glipizide once daily.  He gets diarrhea if he increases the dose of metformin.  He is also continuing Lipitor.  3.  Peripheral neuropathy: Numbness in the feet, predominantly at bedtime is stable.  4.  Hypertension: He will continue valsartan HCTZ 160-13m daily.

## 2017-10-01 NOTE — Progress Notes (Signed)
Labs reviewed by Dr. Delton Coombes today. Proceed with treatment.  Continuous 5FU pump connected per protocol.  Treatment given per orders. Patient tolerated it well without problems. Vitals stable and discharged home from clinic ambulatory. Follow up as scheduled.

## 2017-10-01 NOTE — Progress Notes (Signed)
Christopher Friedman, [place] [address]   CLINIC:  Medical Oncology/Hematology  PCP:  [name], MD [place] [address] [phone]   REASON FOR VISIT:  Follow-up for metastatic colon cancer to the liver and lung.  CURRENT THERAPY: FOLFIRI every 12 weeks.  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 ([place])   05/27/2017 -  Chemotherapy    The patient had palonosetron (ALOXI) injection 0.25 mg, 0.25 mg, Intravenous,  Once, 10 of 12 cycles Administration: 0.25 mg (09/03/2017), 0.25 mg (10/01/2017), 0.25 mg (09/17/2017) irinotecan (CAMPTOSAR) 400 mg in dextrose 5 % 500 mL chemo infusion, 180 mg/m2, Intravenous,  Once, 10 of 12 cycles Dose modification: 144 mg/m2 (Cycle 4, Reason: Provider Judgment) Administration: 320 mg (09/03/2017), 320 mg (09/17/2017) leucovorin 888 mg in dextrose 5 % 250 mL infusion, 400 mg/m2, Intravenous,  Once, 10 of 12 cycles Administration: 900 mg (09/03/2017), 900 mg (09/17/2017) fluorouracil (ADRUCIL) chemo injection 900 mg, 400 mg/m2, Intravenous,  Once, 10 of 12 cycles Dose modification: 320 mg/m2 (Cycle 4, Reason: Provider Judgment) Administration: 700 mg (09/03/2017), 700 mg (09/17/2017) fluorouracil (ADRUCIL) 5,350 mg in sodium chloride 0.9 % 143 mL chemo infusion, 2,400 mg/m2, Intravenous, 1 Day/Dose, 10 of 12 cycles Dose modification: 1,920 mg/m2 (Cycle 4, Reason: Provider Judgment) Administration: 4,250 mg (09/03/2017), 4,250 mg (09/17/2017)  for chemotherapy treatment.       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 male returns for routine follow-up and consideration  for next cycle of chemotherapy.  He is here for cycle 10 of chemotherapy.  After his last cycle he had cramping for 1 to 2 days.  He has some mild fatigue which is stable.  He is continuing to work part-time job.  Denies any bleeding per rectum or melena.  No problems with nausea or vomiting reported.  No fevers or infections.   REVIEW OF SYSTEMS:  Review of Systems  Constitutional: Positive for fatigue.  Gastrointestinal:       Abdominal cramping lasted 1 to 2 days after last treatment.  All other systems reviewed and are negative.    PAST MEDICAL/SURGICAL HISTORY:  Past Medical History:  Diagnosis Date  . Colon cancer ([place])   . Diabetes [facility])    Past Surgical History:  Procedure Laterality Date  . SHOULDER SURGERY  2003     SOCIAL HISTORY:  Social History   Socioeconomic History  . Marital status: Married    Spouse name: Not on file  . Number of children: Not on file  . Years of education: Not on file  . Highest education level: Not on file  Occupational History  . Not on file  Social Needs  . Financial resource strain: Not on file  . Food insecurity:    Worry: Not on file    Inability: Not on file  . Transportation needs:    Medical: Not on file    Non-medical: Not on file  Tobacco Use  . Smoking status: Former Smoker    Last attempt to quit: 04/05/1995    Years since quitting: 22.5  . Smokeless tobacco: Never Used  Substance and Sexual Activity  . Alcohol use: Not Currently    Alcohol/week: 0.0 oz    Comment: used to drink but stopped about 20 years ago  . Drug use: Never  . Sexual activity: Not on file  Lifestyle  . Physical activity:    Days per week: Not on file    Minutes per session: Not on file  . Stress: Not on file  Relationships  . Social connections:    Talks on phone: Not on file    Gets together: Not on file    Attends religious service: Not on file    Active member of club or organization: Not on file    Attends meetings of clubs or  organizations: Not on file    Relationship status: Not on file  . Intimate partner violence:    Fear of current or ex partner: Not on file    Emotionally abused: Not on file    Physically abused: Not on file    Forced sexual activity: Not on file  Other Topics Concern  . Not on file  Social History Narrative  . Not on file    FAMILY HISTORY:  Family History  Problem Relation Age of Onset  . Diabetes Mother   . Cancer Father   . Bone cancer Father   . Colon cancer Neg Hx   . Colon polyps Neg Hx     CURRENT MEDICATIONS:  Outpatient Encounter Medications as of 10/01/2017  Medication Sig Note  . Ascorbic Acid (VITAMIN C) 1000 MG tablet Take 1,000 mg by mouth daily.   Marland Kitchen aspirin 81 MG tablet Take 81 mg by mouth daily.   Marland Kitchen atorvastatin (LIPITOR) 10 MG tablet    . BEE POLLEN PO Take by mouth.   . Blood Glucose Monitoring Suppl (ONE TOUCH ULTRA MINI) W/DEVICE KIT USE TO CHECK BLOOD SUGAR DAILY. 04/05/2015: Received from: External Pharmacy  . Coenzyme Q10 (CO Q 10 PO) Take by mouth.   . erythromycin (E-MYCIN) 250 MG tablet TAKE TWO (2) TABLETS BY MOUTH AT 100 PM, 200 PM, AND 1000 PM PRIOR TO SURGERY 04/05/2015: Received from: External Pharmacy  . glipiZIDE (GLUCOTROL XL) 10 MG 24 hr tablet    . Glucosamine-Chondroit-Collagen ([employer] GLUCO-CHONDROIT PLUS UC-II PO) Take by mouth.   . magnesium oxide (MAG-OX) 400 MG tablet Take 400 mg by mouth 2 (two) times daily.   . metFORMIN (GLUCOPHAGE) 500 MG tablet Take 1 tablet (500 mg total) by mouth 2 (two) times daily with a meal.   . omeprazole (PRILOSEC) 20 MG capsule 1 PO 30 mins prior to breakfast   . valsartan-hydrochlorothiazide (DIOVAN-HCT) 160-25 MG tablet Take 1 tablet by mouth daily.    No facility-administered encounter medications on file as of 10/01/2017.     ALLERGIES:  No Known Allergies   PHYSICAL EXAM:  ECOG Performance status: 0.  I have reviewed his vitals.  Blood pressure is 122/70.  Pulse is 75.  Respiratory rate is 18.   Temperature is 97.8. Physical Exam HEENT: No mucositis or thrush. Chest: Bilaterally clear to auscultation. CVS: S1-S2 regular rate and rhythm. Abdomen: No palpable hepatosplenomegaly or masses. Extremities: No edema or cyanosis.  LABORATORY DATA:  I have reviewed the labs as listed.  CBC    Component Value Date/Time   WBC 5.2 10/01/2017 0925   RBC 3.68 (L) 10/01/2017 0925   HGB 11.3 (L) 10/01/2017 0925   HCT 35.3 (L) 10/01/2017 0925   PLT 207 10/01/2017 0925   MCV 95.9  10/01/2017 0925   MCH 30.7 10/01/2017 0925   MCHC 32.0 10/01/2017 0925   RDW 17.2 (H) 10/01/2017 0925   LYMPHSABS 1.6 10/01/2017 0925   MONOABS 0.4 10/01/2017 0925   EOSABS 0.0 10/01/2017 0925   BASOSABS 0.0 10/01/2017 0925   CMP Latest Ref Rng & Units 09/30/2017 09/16/2017 09/02/2017  Glucose 65 - 99 mg/dL 147(H) 121(H) 202(H)  BUN 6 - 20 mg/dL 12 24(H) 21(H)  Creatinine 0.61 - 1.24 mg/dL 1.07 1.27(H) 1.16  Sodium 135 - 145 mmol/L 138 136 136  Potassium 3.5 - 5.1 mmol/L 4.2 4.1 4.1  Chloride 101 - 111 mmol/L 101 100(L) 100(L)  CO2 22 - 32 mmol/L '29 27 29  ' Calcium 8.9 - 10.3 mg/dL 9.4 9.1 9.5  Total Protein 6.5 - 8.1 g/dL 7.1 7.0 7.3  Total Bilirubin 0.3 - 1.2 mg/dL 0.6 0.7 0.7  Alkaline Phos 38 - 126 U/L 78 82 80  AST 15 - 41 U/L '27 26 24  ' ALT 17 - 63 U/L '25 23 22        ' ASSESSMENT & PLAN:   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  Last CEA level has come down to 3.4.  He will proceed with cycle 10 today.  After 12 cycles, I will repeat CT scan of his chest, abdomen and pelvis to evaluate response.  2.  Diabetes: He continues to take metformin 500 mg  daily along with glipizide once daily.  He gets diarrhea if he increases the dose of metformin.  He is also continuing Lipitor.  3.  Peripheral neuropathy: Numbness in the feet, predominantly at bedtime is stable.  4.  Hypertension: He will continue valsartan HCTZ 160-24m daily.      Orders placed this encounter:  Orders Placed This Encounter  Procedures  . CT Chest W Contrast  . CT Abdomen Pelvis W Contrast  . CBC with Differential ([place])  . CEA  . CMP ([place])  . CBC with Differential ([place])  . CEA  . CMP ([place])  . CBC with Differential ([place])  . CEA  . CMP ([place])      SDerek Jack MD [phone]

## 2017-10-01 NOTE — Patient Instructions (Signed)
[facility] Discharge Instructions for Patients Receiving Chemotherapy   Beginning January 23rd 2017 lab work for the Cancer Center will be done in the  Main lab at [place] on 1st floor. If you have a lab appointment with the Cancer Center please come in thru the  Main Entrance and check in at the main information desk   Today you received the following chemotherapy agents   To help prevent nausea and vomiting after your treatment, we encourage you to take your nausea medication     If you develop nausea and vomiting, or diarrhea that is not controlled by your medication, call the clinic.  The clinic phone number is [phone]. Office hours are Monday-Friday 8:30am-5:00pm.  BELOW ARE SYMPTOMS THAT SHOULD BE REPORTED IMMEDIATELY:  *FEVER GREATER THAN 101.0 F  *CHILLS WITH OR WITHOUT FEVER  NAUSEA AND VOMITING THAT IS NOT CONTROLLED WITH YOUR NAUSEA MEDICATION  *UNUSUAL SHORTNESS OF BREATH  *UNUSUAL BRUISING OR BLEEDING  TENDERNESS IN MOUTH AND THROAT WITH OR WITHOUT PRESENCE OF ULCERS  *URINARY PROBLEMS  *BOWEL PROBLEMS  UNUSUAL RASH Items with * indicate a potential emergency and should be followed up as soon as possible. If you have an emergency after office hours please contact your primary care physician or go to the nearest emergency department.  Please call the clinic during office hours if you have any questions or concerns.   You may also contact the Patient Navigator at [phone] should you have any questions or need assistance in obtaining follow up care.      Resources For Cancer Patients and their Caregivers ? American Cancer Society: Can assist with transportation, wigs, general needs, runs Look Good Feel Better.        1-888-227-6333 ? Cancer Care: Provides financial assistance, online support groups, medication/co-pay assistance.  1-800-813-HOPE (4673) ? [facility] Assists [place] cancer  patients and their families through emotional , educational and financial support.  336-427-4357 ? [place] DSS Where to apply for food stamps, Medicaid and utility assistance. 336-342-1394 ? RCATS: Transportation to medical appointments. 336-347-2287 ? Social Security Administration: May apply for disability if have a Stage IV cancer. 336-342-7796 1-800-772-1213 ? [place] Aging, Disability and Transit Services: Assists with nutrition, care and transit needs. 336-349-2343         

## 2017-10-03 ENCOUNTER — Inpatient Hospital Stay (HOSPITAL_COMMUNITY): Payer: Medicare PPO

## 2017-10-03 ENCOUNTER — Encounter (HOSPITAL_COMMUNITY): Payer: Self-pay

## 2017-10-03 ENCOUNTER — Other Ambulatory Visit: Payer: Self-pay

## 2017-10-03 VITALS — BP 118/76 | HR 65 | Temp 98.0°F | Resp 20

## 2017-10-03 DIAGNOSIS — C189 Malignant neoplasm of colon, unspecified: Secondary | ICD-10-CM | POA: Diagnosis not present

## 2017-10-03 DIAGNOSIS — C7800 Secondary malignant neoplasm of unspecified lung: Secondary | ICD-10-CM | POA: Diagnosis not present

## 2017-10-03 DIAGNOSIS — C787 Secondary malignant neoplasm of liver and intrahepatic bile duct: Principal | ICD-10-CM

## 2017-10-03 DIAGNOSIS — Z5111 Encounter for antineoplastic chemotherapy: Secondary | ICD-10-CM | POA: Diagnosis not present

## 2017-10-03 DIAGNOSIS — Z452 Encounter for adjustment and management of vascular access device: Secondary | ICD-10-CM | POA: Diagnosis not present

## 2017-10-03 MED ORDER — HEPARIN SOD (PORK) LOCK FLUSH 100 UNIT/ML IV SOLN
500.0000 [IU] | Freq: Once | INTRAVENOUS | Status: AC
  Administered 2017-10-03: 500 [IU] via INTRAVENOUS

## 2017-10-03 MED ORDER — SODIUM CHLORIDE 0.9% FLUSH
10.0000 mL | INTRAVENOUS | Status: DC | PRN
  Administered 2017-10-03: 10 mL via INTRAVENOUS
  Filled 2017-10-03: qty 10

## 2017-10-03 NOTE — Progress Notes (Signed)
Christopher Friedman returns today for port de access and flush after 46 hr continous infusion of 62fu. Tolerated infusion without problems. Portacath located rt chest wall was  deaccessed and flushed with 43ml NS and 500U/43ml Heparin and needle removed intact.  Procedure without incident. Patient tolerated procedure well.

## 2017-10-09 DIAGNOSIS — C189 Malignant neoplasm of colon, unspecified: Secondary | ICD-10-CM | POA: Diagnosis not present

## 2017-10-14 ENCOUNTER — Inpatient Hospital Stay (HOSPITAL_COMMUNITY): Payer: Medicare PPO

## 2017-10-14 DIAGNOSIS — C787 Secondary malignant neoplasm of liver and intrahepatic bile duct: Secondary | ICD-10-CM | POA: Diagnosis not present

## 2017-10-14 DIAGNOSIS — C7800 Secondary malignant neoplasm of unspecified lung: Secondary | ICD-10-CM | POA: Diagnosis not present

## 2017-10-14 DIAGNOSIS — Z5111 Encounter for antineoplastic chemotherapy: Secondary | ICD-10-CM | POA: Diagnosis not present

## 2017-10-14 DIAGNOSIS — C189 Malignant neoplasm of colon, unspecified: Secondary | ICD-10-CM | POA: Diagnosis not present

## 2017-10-14 DIAGNOSIS — Z452 Encounter for adjustment and management of vascular access device: Secondary | ICD-10-CM | POA: Diagnosis not present

## 2017-10-14 LAB — CBC WITH DIFFERENTIAL/PLATELET
BASOS ABS: 0 10*3/uL (ref 0.0–0.1)
Basophils Relative: 1 %
Eosinophils Absolute: 0.1 10*3/uL (ref 0.0–0.7)
Eosinophils Relative: 1 %
HEMATOCRIT: 34.7 % — AB (ref 39.0–52.0)
Hemoglobin: 11.3 g/dL — ABNORMAL LOW (ref 13.0–17.0)
LYMPHS PCT: 39 %
Lymphs Abs: 1.7 10*3/uL (ref 0.7–4.0)
MCH: 31.4 pg (ref 26.0–34.0)
MCHC: 32.6 g/dL (ref 30.0–36.0)
MCV: 96.4 fL (ref 78.0–100.0)
Monocytes Absolute: 0.4 10*3/uL (ref 0.1–1.0)
Monocytes Relative: 9 %
NEUTROS ABS: 2.2 10*3/uL (ref 1.7–7.7)
Neutrophils Relative %: 50 %
Platelets: 232 10*3/uL (ref 150–400)
RBC: 3.6 MIL/uL — AB (ref 4.22–5.81)
RDW: 16.6 % — ABNORMAL HIGH (ref 11.5–15.5)
WBC: 4.4 10*3/uL (ref 4.0–10.5)

## 2017-10-14 LAB — COMPREHENSIVE METABOLIC PANEL
ALT: 23 U/L (ref 17–63)
AST: 26 U/L (ref 15–41)
Albumin: 4 g/dL (ref 3.5–5.0)
Alkaline Phosphatase: 70 U/L (ref 38–126)
Anion gap: 9 (ref 5–15)
BILIRUBIN TOTAL: 0.4 mg/dL (ref 0.3–1.2)
BUN: 13 mg/dL (ref 6–20)
CO2: 27 mmol/L (ref 22–32)
CREATININE: 1.07 mg/dL (ref 0.61–1.24)
Calcium: 9.1 mg/dL (ref 8.9–10.3)
Chloride: 101 mmol/L (ref 101–111)
GFR calc Af Amer: >60 mL/min (ref >60)
Glucose, Bld: 142 mg/dL — ABNORMAL HIGH (ref 65–99)
Potassium: 3.9 mmol/L (ref 3.5–5.1)
Sodium: 137 mmol/L (ref 135–145)
TOTAL PROTEIN: 7 g/dL (ref 6.5–8.1)

## 2017-10-15 ENCOUNTER — Inpatient Hospital Stay (HOSPITAL_COMMUNITY): Payer: Medicare PPO

## 2017-10-15 ENCOUNTER — Encounter (HOSPITAL_COMMUNITY): Payer: Self-pay

## 2017-10-15 ENCOUNTER — Other Ambulatory Visit: Payer: Self-pay

## 2017-10-15 VITALS — BP 135/86 | HR 67 | Temp 98.0°F | Resp 18 | Wt 214.6 lb

## 2017-10-15 DIAGNOSIS — C189 Malignant neoplasm of colon, unspecified: Secondary | ICD-10-CM

## 2017-10-15 DIAGNOSIS — Z452 Encounter for adjustment and management of vascular access device: Secondary | ICD-10-CM | POA: Diagnosis not present

## 2017-10-15 DIAGNOSIS — C787 Secondary malignant neoplasm of liver and intrahepatic bile duct: Principal | ICD-10-CM

## 2017-10-15 DIAGNOSIS — Z5111 Encounter for antineoplastic chemotherapy: Secondary | ICD-10-CM | POA: Diagnosis not present

## 2017-10-15 DIAGNOSIS — C7800 Secondary malignant neoplasm of unspecified lung: Secondary | ICD-10-CM | POA: Diagnosis not present

## 2017-10-15 LAB — CEA: CEA1: 3.2 ng/mL (ref 0.0–4.7)

## 2017-10-15 MED ORDER — IRINOTECAN HCL CHEMO INJECTION 100 MG/5ML
144.0000 mg/m2 | Freq: Once | INTRAVENOUS | Status: AC
  Administered 2017-10-15: 320 mg via INTRAVENOUS
  Filled 2017-10-15: qty 1

## 2017-10-15 MED ORDER — DEXAMETHASONE SODIUM PHOSPHATE 10 MG/ML IJ SOLN
10.0000 mg | Freq: Once | INTRAMUSCULAR | Status: AC
  Administered 2017-10-15: 10 mg via INTRAVENOUS
  Filled 2017-10-15: qty 1

## 2017-10-15 MED ORDER — FLUOROURACIL CHEMO INJECTION 2.5 GM/50ML
320.0000 mg/m2 | Freq: Once | INTRAVENOUS | Status: AC
Start: 2017-10-15 — End: 2017-10-15
  Administered 2017-10-15: 700 mg via INTRAVENOUS
  Filled 2017-10-15: qty 14

## 2017-10-15 MED ORDER — ATROPINE SULFATE 1 MG/ML IJ SOLN
0.5000 mg | Freq: Once | INTRAMUSCULAR | Status: AC
  Administered 2017-10-15: 0.5 mg via INTRAVENOUS
  Filled 2017-10-15: qty 1

## 2017-10-15 MED ORDER — SODIUM CHLORIDE 0.9 % IV SOLN: 10.0000 mg | Freq: Once | INTRAVENOUS | Status: DC

## 2017-10-15 MED ORDER — SODIUM CHLORIDE 0.9 % IV SOLN
Freq: Once | INTRAVENOUS | Status: AC
  Administered 2017-10-15: 10:00:00 via INTRAVENOUS

## 2017-10-15 MED ORDER — LEUCOVORIN CALCIUM INJECTION 350 MG
900.0000 mg | Freq: Once | INTRAVENOUS | Status: AC
  Administered 2017-10-15: 900 mg via INTRAVENOUS
  Filled 2017-10-15: qty 10

## 2017-10-15 MED ORDER — ATROPINE SULFATE 1 MG/ML IJ SOLN: 0.5000 mg | Freq: Once | INTRAMUSCULAR | Status: DC | PRN

## 2017-10-15 MED ORDER — PALONOSETRON HCL INJECTION 0.25 MG/5ML
0.2500 mg | Freq: Once | INTRAVENOUS | Status: AC
Start: 2017-10-15 — End: 2017-10-15
  Administered 2017-10-15: 0.25 mg via INTRAVENOUS
  Filled 2017-10-15: qty 5

## 2017-10-15 MED ORDER — FLUOROURACIL CHEMO INJECTION 5 GM/100ML
1920.0000 mg/m2 | INTRAVENOUS | Status: DC
  Administered 2017-10-15: 4250 mg via INTRAVENOUS
  Filled 2017-10-15: qty 85

## 2017-10-15 NOTE — Progress Notes (Signed)
Tolerated infusions w/o adverse reaction.  Alert, in no distress.  VSS.  Discharged ambulatory with home infusion pump running.

## 2017-10-17 ENCOUNTER — Other Ambulatory Visit: Payer: Self-pay

## 2017-10-17 ENCOUNTER — Inpatient Hospital Stay (HOSPITAL_COMMUNITY): Payer: Medicare PPO

## 2017-10-17 VITALS — BP 119/79 | HR 62 | Temp 98.3°F | Resp 18

## 2017-10-17 DIAGNOSIS — Z5111 Encounter for antineoplastic chemotherapy: Secondary | ICD-10-CM | POA: Diagnosis not present

## 2017-10-17 DIAGNOSIS — C189 Malignant neoplasm of colon, unspecified: Secondary | ICD-10-CM | POA: Diagnosis not present

## 2017-10-17 DIAGNOSIS — Z452 Encounter for adjustment and management of vascular access device: Secondary | ICD-10-CM | POA: Diagnosis not present

## 2017-10-17 DIAGNOSIS — C787 Secondary malignant neoplasm of liver and intrahepatic bile duct: Secondary | ICD-10-CM | POA: Diagnosis not present

## 2017-10-17 DIAGNOSIS — C7800 Secondary malignant neoplasm of unspecified lung: Secondary | ICD-10-CM | POA: Diagnosis not present

## 2017-10-17 MED ORDER — HEPARIN SOD (PORK) LOCK FLUSH 100 UNIT/ML IV SOLN
500.0000 [IU] | Freq: Once | INTRAVENOUS | Status: AC | PRN
  Administered 2017-10-17: 500 [IU]

## 2017-10-17 MED ORDER — SODIUM CHLORIDE 0.9% FLUSH
10.0000 mL | INTRAVENOUS | Status: DC | PRN
  Administered 2017-10-17: 10 mL
  Filled 2017-10-17: qty 10

## 2017-10-17 NOTE — Progress Notes (Signed)
Pt in clinic today for Pump D/C. Pt's pump removed and port flushed with good blood return noted. Pt tolerated well. Pt stable and discharged home ambulatory. Pt to return as scheduled.

## 2017-10-20 DIAGNOSIS — E782 Mixed hyperlipidemia: Secondary | ICD-10-CM | POA: Diagnosis not present

## 2017-10-20 DIAGNOSIS — Z6828 Body mass index (BMI) 28.0-28.9, adult: Secondary | ICD-10-CM | POA: Diagnosis not present

## 2017-10-20 DIAGNOSIS — E1169 Type 2 diabetes mellitus with other specified complication: Secondary | ICD-10-CM | POA: Diagnosis not present

## 2017-10-20 DIAGNOSIS — E8881 Metabolic syndrome: Secondary | ICD-10-CM | POA: Diagnosis not present

## 2017-10-20 DIAGNOSIS — C187 Malignant neoplasm of sigmoid colon: Secondary | ICD-10-CM | POA: Diagnosis not present

## 2017-10-28 ENCOUNTER — Inpatient Hospital Stay (HOSPITAL_COMMUNITY): Payer: Medicare PPO | Attending: Hematology

## 2017-10-28 DIAGNOSIS — C7800 Secondary malignant neoplasm of unspecified lung: Secondary | ICD-10-CM | POA: Insufficient documentation

## 2017-10-28 DIAGNOSIS — Z87891 Personal history of nicotine dependence: Secondary | ICD-10-CM | POA: Insufficient documentation

## 2017-10-28 DIAGNOSIS — Z5111 Encounter for antineoplastic chemotherapy: Secondary | ICD-10-CM | POA: Diagnosis not present

## 2017-10-28 DIAGNOSIS — C189 Malignant neoplasm of colon, unspecified: Secondary | ICD-10-CM | POA: Diagnosis not present

## 2017-10-28 DIAGNOSIS — E1142 Type 2 diabetes mellitus with diabetic polyneuropathy: Secondary | ICD-10-CM | POA: Diagnosis not present

## 2017-10-28 DIAGNOSIS — I10 Essential (primary) hypertension: Secondary | ICD-10-CM | POA: Insufficient documentation

## 2017-10-28 DIAGNOSIS — Z7984 Long term (current) use of oral hypoglycemic drugs: Secondary | ICD-10-CM | POA: Insufficient documentation

## 2017-10-28 DIAGNOSIS — Z452 Encounter for adjustment and management of vascular access device: Secondary | ICD-10-CM | POA: Diagnosis not present

## 2017-10-28 DIAGNOSIS — C787 Secondary malignant neoplasm of liver and intrahepatic bile duct: Secondary | ICD-10-CM | POA: Diagnosis not present

## 2017-10-28 LAB — CBC WITH DIFFERENTIAL/PLATELET
BASOS PCT: 1 %
Basophils Absolute: 0 10*3/uL (ref 0.0–0.1)
EOS ABS: 0.1 10*3/uL (ref 0.0–0.7)
EOS PCT: 1 %
HEMATOCRIT: 36.2 % — AB (ref 39.0–52.0)
Hemoglobin: 11.8 g/dL — ABNORMAL LOW (ref 13.0–17.0)
Lymphocytes Relative: 40 %
Lymphs Abs: 2.3 10*3/uL (ref 0.7–4.0)
MCH: 31.7 pg (ref 26.0–34.0)
MCHC: 32.6 g/dL (ref 30.0–36.0)
MCV: 97.3 fL (ref 78.0–100.0)
MONO ABS: 0.5 10*3/uL (ref 0.1–1.0)
MONOS PCT: 8 %
Neutro Abs: 2.9 10*3/uL (ref 1.7–7.7)
Neutrophils Relative %: 50 %
PLATELETS: 215 10*3/uL (ref 150–400)
RBC: 3.72 MIL/uL — ABNORMAL LOW (ref 4.22–5.81)
RDW: 16.1 % — AB (ref 11.5–15.5)
WBC: 5.8 10*3/uL (ref 4.0–10.5)

## 2017-10-28 LAB — COMPREHENSIVE METABOLIC PANEL
ALBUMIN: 4 g/dL (ref 3.5–5.0)
ALT: 26 U/L (ref 0–44)
ANION GAP: 9 (ref 5–15)
AST: 25 U/L (ref 15–41)
Alkaline Phosphatase: 74 U/L (ref 38–126)
BILIRUBIN TOTAL: 0.4 mg/dL (ref 0.3–1.2)
BUN: 14 mg/dL (ref 8–23)
CHLORIDE: 100 mmol/L (ref 98–111)
CO2: 28 mmol/L (ref 22–32)
Calcium: 9.2 mg/dL (ref 8.9–10.3)
Creatinine, Ser: 1.02 mg/dL (ref 0.61–1.24)
GFR calc Af Amer: >60 mL/min (ref >60)
GFR calc non Af Amer: >60 mL/min (ref >60)
GLUCOSE: 143 mg/dL — AB (ref 70–99)
POTASSIUM: 4.2 mmol/L (ref 3.5–5.1)
Sodium: 137 mmol/L (ref 135–145)
TOTAL PROTEIN: 7 g/dL (ref 6.5–8.1)

## 2017-10-29 ENCOUNTER — Encounter (HOSPITAL_COMMUNITY): Payer: Self-pay

## 2017-10-29 ENCOUNTER — Other Ambulatory Visit: Payer: Self-pay

## 2017-10-29 ENCOUNTER — Inpatient Hospital Stay (HOSPITAL_COMMUNITY): Payer: Medicare PPO

## 2017-10-29 VITALS — BP 128/81 | HR 65 | Temp 97.7°F | Resp 18 | Wt 217.2 lb

## 2017-10-29 DIAGNOSIS — C787 Secondary malignant neoplasm of liver and intrahepatic bile duct: Secondary | ICD-10-CM | POA: Diagnosis not present

## 2017-10-29 DIAGNOSIS — I10 Essential (primary) hypertension: Secondary | ICD-10-CM | POA: Diagnosis not present

## 2017-10-29 DIAGNOSIS — C189 Malignant neoplasm of colon, unspecified: Secondary | ICD-10-CM | POA: Diagnosis not present

## 2017-10-29 DIAGNOSIS — Z452 Encounter for adjustment and management of vascular access device: Secondary | ICD-10-CM | POA: Diagnosis not present

## 2017-10-29 DIAGNOSIS — E1142 Type 2 diabetes mellitus with diabetic polyneuropathy: Secondary | ICD-10-CM | POA: Diagnosis not present

## 2017-10-29 DIAGNOSIS — Z5111 Encounter for antineoplastic chemotherapy: Secondary | ICD-10-CM | POA: Diagnosis not present

## 2017-10-29 DIAGNOSIS — C7800 Secondary malignant neoplasm of unspecified lung: Secondary | ICD-10-CM | POA: Diagnosis not present

## 2017-10-29 DIAGNOSIS — Z87891 Personal history of nicotine dependence: Secondary | ICD-10-CM | POA: Diagnosis not present

## 2017-10-29 DIAGNOSIS — Z7984 Long term (current) use of oral hypoglycemic drugs: Secondary | ICD-10-CM | POA: Diagnosis not present

## 2017-10-29 LAB — CEA: CEA: 3.5 ng/mL (ref 0.0–4.7)

## 2017-10-29 MED ORDER — SODIUM CHLORIDE 0.9 % IV SOLN: 10.0000 mg | Freq: Once | INTRAVENOUS | Status: DC

## 2017-10-29 MED ORDER — ATROPINE SULFATE 1 MG/ML IJ SOLN
0.5000 mg | Freq: Once | INTRAMUSCULAR | Status: AC
  Administered 2017-10-29: 0.5 mg via INTRAVENOUS
  Filled 2017-10-29: qty 1

## 2017-10-29 MED ORDER — PALONOSETRON HCL INJECTION 0.25 MG/5ML
0.2500 mg | Freq: Once | INTRAVENOUS | Status: AC
  Administered 2017-10-29: 0.25 mg via INTRAVENOUS
  Filled 2017-10-29: qty 5

## 2017-10-29 MED ORDER — SODIUM CHLORIDE 0.9 % IV SOLN
1920.0000 mg/m2 | INTRAVENOUS | Status: DC
  Administered 2017-10-29: 4250 mg via INTRAVENOUS
  Filled 2017-10-29: qty 85

## 2017-10-29 MED ORDER — HEPARIN SOD (PORK) LOCK FLUSH 100 UNIT/ML IV SOLN
500.0000 [IU] | Freq: Once | INTRAVENOUS | Status: DC | PRN
  Filled 2017-10-29: qty 5

## 2017-10-29 MED ORDER — SODIUM CHLORIDE 0.9% FLUSH
10.0000 mL | INTRAVENOUS | Status: DC | PRN
Start: 2017-10-29 — End: 2017-10-29
  Administered 2017-10-29: 10 mL
  Filled 2017-10-29: qty 10

## 2017-10-29 MED ORDER — ATROPINE SULFATE 1 MG/ML IJ SOLN: 0.5000 mg | Freq: Once | INTRAMUSCULAR | Status: AC | PRN

## 2017-10-29 MED ORDER — FLUOROURACIL CHEMO INJECTION 2.5 GM/50ML
320.0000 mg/m2 | Freq: Once | INTRAVENOUS | Status: AC
  Administered 2017-10-29: 700 mg via INTRAVENOUS
  Filled 2017-10-29: qty 14

## 2017-10-29 MED ORDER — SODIUM CHLORIDE 0.9 % IV SOLN
Freq: Once | INTRAVENOUS | Status: AC
  Administered 2017-10-29: 09:00:00 via INTRAVENOUS

## 2017-10-29 MED ORDER — LEUCOVORIN CALCIUM INJECTION 350 MG
900.0000 mg | Freq: Once | INTRAVENOUS | Status: AC
  Administered 2017-10-29: 900 mg via INTRAVENOUS
  Filled 2017-10-29: qty 10

## 2017-10-29 MED ORDER — IRINOTECAN HCL CHEMO INJECTION 100 MG/5ML
144.0000 mg/m2 | Freq: Once | INTRAVENOUS | Status: AC
  Administered 2017-10-29: 320 mg via INTRAVENOUS
  Filled 2017-10-29: qty 15

## 2017-10-29 MED ORDER — DEXAMETHASONE SODIUM PHOSPHATE 10 MG/ML IJ SOLN
10.0000 mg | Freq: Once | INTRAMUSCULAR | Status: AC
  Administered 2017-10-29: 10 mg via INTRAVENOUS
  Filled 2017-10-29: qty 1

## 2017-10-29 NOTE — Progress Notes (Signed)
Patient presents for chemotherapy today. No new complaints or issues at this time. Labs reviewed with Dr. Delton Coombes, proceed with treatment.   Continuous 5FU pump connected at discharge today per orders.    Treatment given per orders. Patient tolerated it well without problems. Vitals stable and discharged home from clinic ambulatory. Follow up as scheduled.

## 2017-10-29 NOTE — Patient Instructions (Signed)
[facility] Discharge Instructions for Patients Receiving Chemotherapy   Beginning January 23rd 2017 lab work for the Cancer Center will be done in the  Main lab at [place] on 1st floor. If you have a lab appointment with the Cancer Center please come in thru the  Main Entrance and check in at the main information desk   Today you received the following chemotherapy agents   To help prevent nausea and vomiting after your treatment, we encourage you to take your nausea medication     If you develop nausea and vomiting, or diarrhea that is not controlled by your medication, call the clinic.  The clinic phone number is [phone]. Office hours are Monday-Friday 8:30am-5:00pm.  BELOW ARE SYMPTOMS THAT SHOULD BE REPORTED IMMEDIATELY:  *FEVER GREATER THAN 101.0 F  *CHILLS WITH OR WITHOUT FEVER  NAUSEA AND VOMITING THAT IS NOT CONTROLLED WITH YOUR NAUSEA MEDICATION  *UNUSUAL SHORTNESS OF BREATH  *UNUSUAL BRUISING OR BLEEDING  TENDERNESS IN MOUTH AND THROAT WITH OR WITHOUT PRESENCE OF ULCERS  *URINARY PROBLEMS  *BOWEL PROBLEMS  UNUSUAL RASH Items with * indicate a potential emergency and should be followed up as soon as possible. If you have an emergency after office hours please contact your primary care physician or go to the nearest emergency department.  Please call the clinic during office hours if you have any questions or concerns.   You may also contact the Patient Navigator at [phone] should you have any questions or need assistance in obtaining follow up care.      Resources For Cancer Patients and their Caregivers ? American Cancer Society: Can assist with transportation, wigs, general needs, runs Look Good Feel Better.        1-888-227-6333 ? Cancer Care: Provides financial assistance, online support groups, medication/co-pay assistance.  1-800-813-HOPE (4673) ? [facility] Assists [place] cancer  patients and their families through emotional , educational and financial support.  336-427-4357 ? [place] DSS Where to apply for food stamps, Medicaid and utility assistance. 336-342-1394 ? RCATS: Transportation to medical appointments. 336-347-2287 ? Social Security Administration: May apply for disability if have a Stage IV cancer. 336-342-7796 1-800-772-1213 ? [place] Aging, Disability and Transit Services: Assists with nutrition, care and transit needs. 336-349-2343         

## 2017-10-31 ENCOUNTER — Inpatient Hospital Stay (HOSPITAL_COMMUNITY): Payer: Medicare PPO

## 2017-10-31 ENCOUNTER — Encounter (HOSPITAL_COMMUNITY): Payer: Self-pay

## 2017-10-31 ENCOUNTER — Other Ambulatory Visit: Payer: Self-pay

## 2017-10-31 VITALS — BP 117/79 | HR 63 | Temp 98.0°F | Resp 18

## 2017-10-31 DIAGNOSIS — C189 Malignant neoplasm of colon, unspecified: Secondary | ICD-10-CM

## 2017-10-31 DIAGNOSIS — Z452 Encounter for adjustment and management of vascular access device: Secondary | ICD-10-CM | POA: Diagnosis not present

## 2017-10-31 DIAGNOSIS — C787 Secondary malignant neoplasm of liver and intrahepatic bile duct: Principal | ICD-10-CM

## 2017-10-31 DIAGNOSIS — C7800 Secondary malignant neoplasm of unspecified lung: Secondary | ICD-10-CM | POA: Diagnosis not present

## 2017-10-31 DIAGNOSIS — Z7984 Long term (current) use of oral hypoglycemic drugs: Secondary | ICD-10-CM | POA: Diagnosis not present

## 2017-10-31 DIAGNOSIS — Z87891 Personal history of nicotine dependence: Secondary | ICD-10-CM | POA: Diagnosis not present

## 2017-10-31 DIAGNOSIS — I10 Essential (primary) hypertension: Secondary | ICD-10-CM | POA: Diagnosis not present

## 2017-10-31 DIAGNOSIS — Z5111 Encounter for antineoplastic chemotherapy: Secondary | ICD-10-CM | POA: Diagnosis not present

## 2017-10-31 DIAGNOSIS — E1142 Type 2 diabetes mellitus with diabetic polyneuropathy: Secondary | ICD-10-CM | POA: Diagnosis not present

## 2017-10-31 MED ORDER — SODIUM CHLORIDE 0.9% FLUSH
10.0000 mL | INTRAVENOUS | Status: DC | PRN
  Administered 2017-10-31: 10 mL
  Filled 2017-10-31: qty 10

## 2017-10-31 MED ORDER — HEPARIN SOD (PORK) LOCK FLUSH 100 UNIT/ML IV SOLN
500.0000 [IU] | Freq: Once | INTRAVENOUS | Status: AC | PRN
  Administered 2017-10-31: 500 [IU]

## 2017-10-31 NOTE — Patient Instructions (Signed)
[place] at [facility] Discharge Instructions  Port de-accessed today per protocol Follow up as scheduled   Thank you for choosing [place] at [facility] to provide your oncology and hematology care.  To afford each patient quality time with our provider, please arrive at least 15 minutes before your scheduled appointment time.   If you have a lab appointment with the [facility] please come in thru the  Main Entrance and check in at the main information desk  You need to re-schedule your appointment should you arrive 10 or more minutes late.  We strive to give you quality time with our providers, and arriving late affects you and other patients whose appointments are after yours.  Also, if you no show three or more times for appointments you may be dismissed from the clinic at the providers discretion.     Again, thank you for choosing [facility].  Our hope is that these requests will decrease the amount of time that you wait before being seen by our physicians.       _____________________________________________________________  Should you have questions after your visit to [facility], please contact our office at [phone] between the hours of 8:30 a.m. and 4:30 p.m.  Voicemails left after 4:30 p.m. will not be returned until the following business day.  For prescription refill requests, have your pharmacy contact our office.       Resources For Cancer Patients and their Caregivers ? American Cancer Society: Can assist with transportation, wigs, general needs, runs Look Good Feel Better.        873-835-6304 ? Cancer Care: Provides financial assistance, online support groups, medication/co-pay assistance.  1-800-813-HOPE (203) 213-4509) ? [facility] Assists [place] cancer patients and their families through emotional , educational and financial support.   (580)420-4292 ? [place] DSS Where to apply for food stamps, Medicaid and utility assistance. 479-501-6813 ? RCATS: Transportation to medical appointments. 567-389-9412 ? Social Security Administration: May apply for disability if have a Stage IV cancer. (857)275-0799 408-320-5189 ? [employer], Disability and Transit Services: Assists with nutrition, care and transit needs. [place] Support Programs:   > Cancer Support Group  2nd Tuesday of the month 1pm-2pm, Journey Room   > Creative Journey  3rd Tuesday of the month 1130am-1pm, Journey Room

## 2017-10-31 NOTE — Progress Notes (Signed)
Christopher Friedman returns today for port de access and flush after 46 hr continous infusion of 43fu. Tolerated infusion without problems. Portacath located right chest wall was deaccessed and flushed with 72ml NS and 500U/57ml Heparin and needle removed intact.  Procedure without incident. Patient tolerated procedure well.  Treatment given per orders. Patient tolerated it well without problems. Vitals stable and discharged home from clinic ambulatory. Follow up as scheduled.

## 2017-11-07 ENCOUNTER — Other Ambulatory Visit (HOSPITAL_COMMUNITY): Payer: Self-pay

## 2017-11-07 DIAGNOSIS — C189 Malignant neoplasm of colon, unspecified: Secondary | ICD-10-CM

## 2017-11-07 DIAGNOSIS — Z5111 Encounter for antineoplastic chemotherapy: Secondary | ICD-10-CM

## 2017-11-07 DIAGNOSIS — C787 Secondary malignant neoplasm of liver and intrahepatic bile duct: Principal | ICD-10-CM

## 2017-11-11 ENCOUNTER — Other Ambulatory Visit (HOSPITAL_COMMUNITY): Payer: Medicare PPO

## 2017-11-11 ENCOUNTER — Encounter (HOSPITAL_COMMUNITY): Payer: Self-pay

## 2017-11-11 ENCOUNTER — Ambulatory Visit (HOSPITAL_COMMUNITY)
Admission: RE | Admit: 2017-11-11 | Discharge: 2017-11-11 | Disposition: A | Discharge status: Home / Self Care | Payer: Medicare PPO | Source: Ambulatory Visit | Attending: Hematology | Admitting: Hematology

## 2017-11-11 DIAGNOSIS — K4020 Bilateral inguinal hernia, without obstruction or gangrene, not specified as recurrent: Secondary | ICD-10-CM | POA: Insufficient documentation

## 2017-11-11 DIAGNOSIS — C787 Secondary malignant neoplasm of liver and intrahepatic bile duct: Secondary | ICD-10-CM | POA: Insufficient documentation

## 2017-11-11 DIAGNOSIS — K760 Fatty (change of) liver, not elsewhere classified: Secondary | ICD-10-CM | POA: Diagnosis not present

## 2017-11-11 DIAGNOSIS — C7800 Secondary malignant neoplasm of unspecified lung: Secondary | ICD-10-CM | POA: Diagnosis not present

## 2017-11-11 DIAGNOSIS — C189 Malignant neoplasm of colon, unspecified: Secondary | ICD-10-CM | POA: Diagnosis not present

## 2017-11-11 DIAGNOSIS — I2510 Atherosclerotic heart disease of native coronary artery without angina pectoris: Secondary | ICD-10-CM | POA: Diagnosis not present

## 2017-11-11 DIAGNOSIS — I700 Atherosclerosis of aorta: Secondary | ICD-10-CM | POA: Diagnosis not present

## 2017-11-11 MED ORDER — IOPAMIDOL (ISOVUE-300) INJECTION 61%
100.0000 mL | Freq: Once | INTRAVENOUS | Status: AC | PRN
  Administered 2017-11-11: 100 mL via INTRAVENOUS

## 2017-11-12 ENCOUNTER — Other Ambulatory Visit: Payer: Self-pay

## 2017-11-12 ENCOUNTER — Inpatient Hospital Stay (HOSPITAL_COMMUNITY): Payer: Medicare PPO

## 2017-11-12 ENCOUNTER — Encounter (HOSPITAL_COMMUNITY): Payer: Self-pay | Admitting: Hematology

## 2017-11-12 ENCOUNTER — Other Ambulatory Visit (HOSPITAL_COMMUNITY): Payer: Self-pay | Admitting: Hematology

## 2017-11-12 ENCOUNTER — Inpatient Hospital Stay (HOSPITAL_BASED_OUTPATIENT_CLINIC_OR_DEPARTMENT_OTHER): Payer: Medicare PPO | Admitting: Hematology

## 2017-11-12 ENCOUNTER — Ambulatory Visit (HOSPITAL_COMMUNITY): Payer: Medicare PPO | Admitting: Hematology

## 2017-11-12 ENCOUNTER — Ambulatory Visit (HOSPITAL_COMMUNITY): Payer: Medicare PPO

## 2017-11-12 DIAGNOSIS — C787 Secondary malignant neoplasm of liver and intrahepatic bile duct: Secondary | ICD-10-CM | POA: Diagnosis not present

## 2017-11-12 DIAGNOSIS — C189 Malignant neoplasm of colon, unspecified: Secondary | ICD-10-CM

## 2017-11-12 DIAGNOSIS — I10 Essential (primary) hypertension: Secondary | ICD-10-CM | POA: Diagnosis not present

## 2017-11-12 DIAGNOSIS — E1142 Type 2 diabetes mellitus with diabetic polyneuropathy: Secondary | ICD-10-CM | POA: Diagnosis not present

## 2017-11-12 DIAGNOSIS — C7800 Secondary malignant neoplasm of unspecified lung: Secondary | ICD-10-CM

## 2017-11-12 DIAGNOSIS — Z452 Encounter for adjustment and management of vascular access device: Secondary | ICD-10-CM | POA: Diagnosis not present

## 2017-11-12 DIAGNOSIS — Z7984 Long term (current) use of oral hypoglycemic drugs: Secondary | ICD-10-CM | POA: Diagnosis not present

## 2017-11-12 DIAGNOSIS — Z5111 Encounter for antineoplastic chemotherapy: Secondary | ICD-10-CM | POA: Diagnosis not present

## 2017-11-12 DIAGNOSIS — Z87891 Personal history of nicotine dependence: Secondary | ICD-10-CM

## 2017-11-12 LAB — COMPREHENSIVE METABOLIC PANEL
ALBUMIN: 4.1 g/dL (ref 3.5–5.0)
ALK PHOS: 74 U/L (ref 38–126)
ALT: 30 U/L (ref 0–44)
AST: 29 U/L (ref 15–41)
Anion gap: 7 (ref 5–15)
BILIRUBIN TOTAL: 0.8 mg/dL (ref 0.3–1.2)
BUN: 18 mg/dL (ref 8–23)
CALCIUM: 9.1 mg/dL (ref 8.9–10.3)
CO2: 29 mmol/L (ref 22–32)
CREATININE: 1.28 mg/dL — AB (ref 0.61–1.24)
Chloride: 101 mmol/L (ref 98–111)
GFR calc Af Amer: >60 mL/min (ref >60)
GFR calc non Af Amer: 57 mL/min — ABNORMAL LOW (ref >60)
GLUCOSE: 177 mg/dL — AB (ref 70–99)
Potassium: 4.1 mmol/L (ref 3.5–5.1)
SODIUM: 137 mmol/L (ref 135–145)
TOTAL PROTEIN: 7 g/dL (ref 6.5–8.1)

## 2017-11-12 LAB — CBC WITH DIFFERENTIAL/PLATELET
BASOS ABS: 0 10*3/uL (ref 0.0–0.1)
BASOS PCT: 1 %
EOS PCT: 2 %
Eosinophils Absolute: 0.1 10*3/uL (ref 0.0–0.7)
HEMATOCRIT: 36.4 % — AB (ref 39.0–52.0)
Hemoglobin: 11.9 g/dL — ABNORMAL LOW (ref 13.0–17.0)
Lymphocytes Relative: 33 %
Lymphs Abs: 1.6 10*3/uL (ref 0.7–4.0)
MCH: 32 pg (ref 26.0–34.0)
MCHC: 32.7 g/dL (ref 30.0–36.0)
MCV: 97.8 fL (ref 78.0–100.0)
MONO ABS: 0.4 10*3/uL (ref 0.1–1.0)
Monocytes Relative: 8 %
NEUTROS ABS: 2.7 10*3/uL (ref 1.7–7.7)
Neutrophils Relative %: 56 %
Platelets: 188 10*3/uL (ref 150–400)
RBC: 3.72 MIL/uL — ABNORMAL LOW (ref 4.22–5.81)
RDW: 16.3 % — AB (ref 11.5–15.5)
WBC: 4.8 10*3/uL (ref 4.0–10.5)

## 2017-11-12 NOTE — Progress Notes (Signed)
Labs drawn from left arm.  No treatment today.

## 2017-11-12 NOTE — Assessment & Plan Note (Signed)
1.  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  He tolerated it very well.  We discussed the results of the CT scan CAP which showed a stable left upper lobe subcentimeter lung nodule.  Segment 4 lesion in the liver has decreased to 2.4 cm.  No other clearly evident liver lesions present.  At this time we discussed the option of giving him a break from chemotherapy versus putting him on maintenance with 5-FU infusion/Xeloda.  However I would also want to get an opinion from IR to see if he is a candidate for liver directed therapy in the form of Y 90 or chemoembolization.  I will leave it up to the radiologist to order the MRI of the liver.  We will see him back in 3 weeks for follow-up.  2.  Diabetes: He continues to take metformin 500 mg daily along with glipizide once daily.  He gets diarrhea if he increases the dose of metformin.  He is also continuing Lipitor.  3.  Peripheral neuropathy: Numbness in the feet, predominantly at bedtime is stable.  4.  Hypertension: He will continue valsartan HCTZ 160-36m daily.

## 2017-11-12 NOTE — Progress Notes (Signed)
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 ([place])   05/27/2017 -  Chemotherapy    The patient had palonosetron (ALOXI) injection 0.25 mg, 0.25 mg, Intravenous,  Once, 12 of 12 cycles Administration: 0.25 mg (09/03/2017), 0.25 mg (10/01/2017), 0.25 mg (10/15/2017), 0.25 mg (10/29/2017), 0.25 mg (09/17/2017) irinotecan (CAMPTOSAR) 400 mg in dextrose 5 % 500 mL chemo infusion, 180 mg/m2, Intravenous,  Once, 12 of 12 cycles Dose modification: 144 mg/m2 (Cycle 4, Reason: Provider Judgment) Administration: 320 mg (09/03/2017), 320 mg (10/01/2017), 320 mg (10/15/2017), 320 mg (10/29/2017), 320 mg (09/17/2017) leucovorin 888 mg in dextrose 5 % 250 mL infusion, 400 mg/m2, Intravenous,  Once, 12 of 12 cycles Administration: 900 mg (09/03/2017), 900 mg (10/01/2017), 900 mg (10/15/2017), 900 mg (10/29/2017), 900 mg (09/17/2017) fluorouracil (ADRUCIL) chemo injection 900 mg, 400 mg/m2, Intravenous,  Once, 12 of 12 cycles Dose modification: 320 mg/m2 (Cycle 4, Reason: Provider Judgment) Administration: 700 mg (09/03/2017), 700 mg (10/01/2017), 700 mg (10/15/2017), 700 mg (10/29/2017), 700 mg (09/17/2017) fluorouracil (ADRUCIL) 5,350 mg in sodium chloride 0.9 % 143 mL chemo infusion, 2,400 mg/m2, Intravenous, 1 Day/Dose, 12 of 12 cycles Dose modification: 1,920 mg/m2 (Cycle 4, Reason: Provider Judgment) Administration: 4,250 mg (09/03/2017), 4,250 mg (10/01/2017), 4,250 mg (10/15/2017), 4,250 mg (10/29/2017), 4,250 mg (09/17/2017)  for chemotherapy treatment.       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 Hacker 67 y.o. male returns for routine follow-up for metastatic colon cancer to the liver and lung. Patient is here today with his wife. He has completed 12 cycles of chemo. Patients appetite is great and at 100%. His energy levels are at 75%. Denies any new pains. Denies any nausea, vomiting, or diarrhea. Denies any numbness or tingling.     REVIEW OF SYSTEMS:  Review of Systems  Constitutional: Positive for fatigue.  HENT:   Positive for mouth sores.   Eyes: Negative.   Respiratory: Negative.   Cardiovascular: Negative.   Gastrointestinal: Negative.   Endocrine: Negative.   Genitourinary: Negative.    Musculoskeletal: Negative.   Skin: Negative.   Neurological: Negative.   Hematological: Negative.   Psychiatric/Behavioral: Negative.      PAST MEDICAL/SURGICAL HISTORY:  Past Medical History:  Diagnosis Date  . Colon cancer ([place])   . Diabetes [facility])    Past Surgical History:  Procedure Laterality Date  . SHOULDER SURGERY  2003     SOCIAL HISTORY:  Social History   Socioeconomic History  . Marital status: Married    Spouse name: Not on file  . Number of children: Not on file  . Years of education: Not on file  . Highest education level: Not on file  Occupational History  . Not on file  Social Needs  . Financial resource strain: Not on file  . Food insecurity:    Worry:  Not on file    Inability: Not on file  . Transportation needs:    Medical: Not on file    Non-medical: Not on file  Tobacco Use  . Smoking status: Former Smoker    Last attempt to quit: 04/05/1995    Years since quitting: 22.6  . Smokeless tobacco: Never Used  Substance and Sexual Activity  . Alcohol use: Not Currently    Alcohol/week: 0.0 oz    Comment:  used to drink but stopped about 20 years ago  . Drug use: Never  . Sexual activity: Not on file  Lifestyle  . Physical activity:    Days per week: Not on file    Minutes per session: Not on file  . Stress: Not on file  Relationships  . Social connections:    Talks on phone: Not on file    Gets together: Not on file    Attends religious service: Not on file    Active member of club or organization: Not on file    Attends meetings of clubs or organizations: Not on file    Relationship status: Not on file  . Intimate partner violence:    Fear of current or ex partner: Not on file    Emotionally abused: Not on file    Physically abused: Not on file    Forced sexual activity: Not on file  Other Topics Concern  . Not on file  Social History Narrative  . Not on file    FAMILY HISTORY:  Family History  Problem Relation Age of Onset  . Diabetes Mother   . Cancer Father   . Bone cancer Father   . Colon cancer Neg Hx   . Colon polyps Neg Hx     CURRENT MEDICATIONS:  Outpatient Encounter Medications as of 11/12/2017  Medication Sig Note  . Ascorbic Acid (VITAMIN C) 1000 MG tablet Take 1,000 mg by mouth daily.   Marland Kitchen aspirin 81 MG tablet Take 81 mg by mouth daily.   Marland Kitchen atorvastatin (LIPITOR) 10 MG tablet    . BEE POLLEN PO Take by mouth.   . Blood Glucose Monitoring Suppl (ONE TOUCH ULTRA MINI) W/DEVICE KIT USE TO CHECK BLOOD SUGAR DAILY. 04/05/2015: Received from: External Pharmacy  . Coenzyme Q10 (CO Q 10 PO) Take by mouth.   . erythromycin (E-MYCIN) 250 MG tablet TAKE TWO (2) TABLETS BY MOUTH AT 100 PM, 200 PM, AND 1000 PM PRIOR TO SURGERY 04/05/2015: Received from: External Pharmacy  . glipiZIDE (GLUCOTROL XL) 10 MG 24 hr tablet    . Glucosamine-Chondroit-Collagen ([employer] GLUCO-CHONDROIT PLUS UC-II PO) Take by mouth.   . magnesium oxide (MAG-OX) 400 MG tablet Take 400 mg by mouth 2 (two) times daily.   . metFORMIN (GLUCOPHAGE) 500 MG tablet Take 1 tablet (500 mg total) by mouth 2 (two)  times daily with a meal.   . omeprazole (PRILOSEC) 20 MG capsule 1 PO 30 mins prior to breakfast   . valsartan-hydrochlorothiazide (DIOVAN-HCT) 160-25 MG tablet Take 1 tablet by mouth daily.    No facility-administered encounter medications on file as of 11/12/2017.     ALLERGIES:  No Known Allergies   PHYSICAL EXAM:  ECOG Performance status: 0  VITAL SIGNS: BP: 124/75, P:66, RESP: 18, TEMP: 97.9, 02:98%  Physical Exam   LABORATORY DATA:  I have reviewed the labs as listed.  CBC    Component Value Date/Time   WBC 4.8 11/12/2017 0959   RBC 3.72 (L) 11/12/2017 0959   HGB  11.9 (L) 11/12/2017 0959   HCT 36.4 (L) 11/12/2017 0959   PLT 188 11/12/2017 0959   MCV 97.8 11/12/2017 0959   MCH 32.0 11/12/2017 0959   MCHC 32.7 11/12/2017 0959   RDW 16.3 (H) 11/12/2017 0959   LYMPHSABS 1.6 11/12/2017 0959   MONOABS 0.4 11/12/2017 0959   EOSABS 0.1 11/12/2017 0959   BASOSABS 0.0 11/12/2017 0959   CMP Latest Ref Rng & Units 11/12/2017 10/28/2017 10/14/2017  Glucose 70 - 99 mg/dL 177(H) 143(H) 142(H)  BUN 8 - 23 mg/dL _0 Creatinine 0.61 - 1.24 mg/dL 1.28(H) 1.02 1.07  Sodium 135 - 145 mmol/L 137 137 137  Potassium 3.5 - 5.1 mmol/L 4.1 4.2 3.9  Chloride 98 - 111 mmol/L 101 100 101  CO2 22 - 32 mmol/L _1 Calcium 8.9 - 10.3 mg/dL 9.1 9.2 9.1  Total Protein 6.5 - 8.1 g/dL 7.0 7.0 7.0  Total Bilirubin 0.3 - 1.2 mg/dL 0.8 0.4 0.4  Alkaline Phos 38 - 126 U/L 74 74 70  AST 15 - 41 U/L _2 ALT 0 - 44 U/L _3 DIAGNOSTIC IMAGING:  I have independently reviewed the images of the CT scan dated 11/11/2017 and discussed the results with the patient and his wife.    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  He tolerated it very well.  We discussed the results of the CT scan CAP which showed a stable left upper lobe subcentimeter lung nodule.  Segment 4 lesion in the liver has decreased to 2.4 cm.  No other clearly evident liver lesions present.  At this time we discussed the option of giving him a break from chemotherapy versus putting him on maintenance with 5-FU infusion/Xeloda.  However I would also want to get an opinion from IR to see if he is a candidate for liver directed therapy in the form of Y 90 or chemoembolization.  I will leave it up to the radiologist to order the MRI of the liver.  We will see him back in 3 weeks for follow-up.  2.  Diabetes: He continues to take metformin 500 mg daily along with glipizide once daily.  He gets diarrhea if he increases the dose of metformin.  He is also continuing Lipitor.  3.  Peripheral neuropathy: Numbness in the feet, predominantly at bedtime is stable.  4.  Hypertension: He will continue valsartan HCTZ 160-58m daily.      Orders placed this encounter:  Orders Placed This Encounter  Procedures  . CBC with Differential/Platelet  . Comprehensive metabolic panel  . CEA      SDerek Jack MD [phone]

## 2017-11-13 ENCOUNTER — Other Ambulatory Visit (HOSPITAL_COMMUNITY): Payer: Self-pay | Admitting: Nurse Practitioner

## 2017-11-13 ENCOUNTER — Telehealth: Payer: Self-pay | Admitting: *Deleted

## 2017-11-13 DIAGNOSIS — C189 Malignant neoplasm of colon, unspecified: Secondary | ICD-10-CM

## 2017-11-13 DIAGNOSIS — C787 Secondary malignant neoplasm of liver and intrahepatic bile duct: Principal | ICD-10-CM

## 2017-11-13 LAB — CEA: CEA1: 3.5 ng/mL (ref 0.0–4.7)

## 2017-11-13 NOTE — Telephone Encounter (Signed)
Lmom pt hm and cell to sch consult with Dr. Anselm Pancoast referred by Dr. Levonne Hubert

## 2017-11-14 ENCOUNTER — Encounter (HOSPITAL_COMMUNITY): Payer: Medicare PPO

## 2017-11-21 ENCOUNTER — Ambulatory Visit (HOSPITAL_COMMUNITY)
Admission: RE | Admit: 2017-11-21 | Discharge: 2017-11-21 | Disposition: A | Discharge status: Home / Self Care | Payer: Medicare PPO | Source: Ambulatory Visit | Attending: Nurse Practitioner | Admitting: Nurse Practitioner

## 2017-11-21 DIAGNOSIS — C189 Malignant neoplasm of colon, unspecified: Secondary | ICD-10-CM | POA: Insufficient documentation

## 2017-11-21 DIAGNOSIS — K760 Fatty (change of) liver, not elsewhere classified: Secondary | ICD-10-CM | POA: Insufficient documentation

## 2017-11-21 DIAGNOSIS — C787 Secondary malignant neoplasm of liver and intrahepatic bile duct: Secondary | ICD-10-CM | POA: Diagnosis not present

## 2017-11-21 MED ORDER — GADOBENATE DIMEGLUMINE 529 MG/ML IV SOLN
20.0000 mL | Freq: Once | INTRAVENOUS | Status: AC | PRN
Start: 2017-11-21 — End: 2017-11-21
  Administered 2017-11-21: 20 mL via INTRAVENOUS

## 2017-11-26 ENCOUNTER — Encounter: Payer: Self-pay | Admitting: Radiology

## 2017-11-26 ENCOUNTER — Ambulatory Visit
Admission: RE | Admit: 2017-11-26 | Discharge: 2017-11-26 | Disposition: A | Discharge status: Home / Self Care | Payer: Medicare PPO | Source: Ambulatory Visit | Attending: Hematology | Admitting: Hematology

## 2017-11-26 ENCOUNTER — Other Ambulatory Visit (HOSPITAL_COMMUNITY): Payer: Self-pay | Admitting: Diagnostic Radiology

## 2017-11-26 DIAGNOSIS — C787 Secondary malignant neoplasm of liver and intrahepatic bile duct: Secondary | ICD-10-CM | POA: Diagnosis not present

## 2017-11-26 DIAGNOSIS — C189 Malignant neoplasm of colon, unspecified: Secondary | ICD-10-CM

## 2017-11-26 DIAGNOSIS — Z85038 Personal history of other malignant neoplasm of large intestine: Secondary | ICD-10-CM | POA: Diagnosis not present

## 2017-11-26 HISTORY — PX: IR RADIOLOGIST EVAL & MGMT: IMG5224

## 2017-11-26 NOTE — Consult Note (Signed)
Chief Complaint: Patient was seen in consultation today for  Chief Complaint  Patient presents with  . Consult    Consult for Metastatic Colon Adenocarcinoma to Liver   at the request of Katragadda,Sreedhar  Referring Physician(s): Katragadda,Sreedhar  History of Present Illness: Christopher Friedman is a 67 y.o. male with history of stage IV metastatic colon cancer.  Patient was diagnosed with sigmoid adenocarcinoma in 2016 and underwent resection of the colon cancer.  According to the patient's wife, he had positive lymph nodes at that time.  I do not have access to all of the reports and imaging from the time of his initial diagnosis.  Patient initially had chemotherapy following or around the time of his surgery.  It sounds like the patient was under surveillance until early this year when he was found to have abnormal labs and a large liver lesion and suspicious pulmonary nodules.  Patient underwent 6 cycles of dose reduced FOLFIRI which demonstrated decrease in the left lung nodule and the left hepatic lesion from 7.7 cm to 4.7 cm.  Subsequently, he completed 12 cycles of FOLFIRI on 10/29/2017 and the segment 4 liver lesion decreased 2.4 cm.  Patient had a MRI on 11/21/2017 and this suggested that the metastatic disease is isolated to the left hepatic lobe.  Patient has no complaints.  He feels very good.  He has had no symptoms throughout his treatment course.  He is very active and has no limitations at this time.  He continues to have good appetite and no weight loss.  No bowel issues.  Past Medical History:  Diagnosis Date  . Colon cancer ([place])   . Diabetes [facility])     Past Surgical History:  Procedure Laterality Date  . IR RADIOLOGIST EVAL & MGMT  11/26/2017  . SHOULDER SURGERY  2003    Allergies: Patient has no known allergies.  Medications: Prior to Admission medications   Medication Sig Start Date End Date Taking? Authorizing Provider  Ascorbic Acid (VITAMIN C) 1000 MG  tablet Take 1,000 mg by mouth daily.   Yes [provider]  aspirin 81 MG tablet Take 81 mg by mouth daily.   Yes [provider]  atorvastatin (LIPITOR) 10 MG tablet  07/22/17  Yes [provider]  BEE POLLEN PO Take by mouth.   Yes [provider]  Blood Glucose Monitoring Suppl (ONE TOUCH ULTRA MINI) W/DEVICE KIT USE TO CHECK BLOOD SUGAR DAILY. 01/05/15  Yes [provider]  Coenzyme Q10 (CO Q 10 PO) Take by mouth.   Yes [provider]  glipiZIDE (GLUCOTROL XL) 10 MG 24 hr tablet  07/22/17  Yes [provider]  Glucosamine-Chondroit-Collagen ([employer] GLUCO-CHONDROIT PLUS UC-II PO) Take by mouth.   Yes [provider]  magnesium oxide (MAG-OX) 400 MG tablet Take 400 mg by mouth 2 (two) times daily.   Yes [provider]  metFORMIN (GLUCOPHAGE) 500 MG tablet Take 1 tablet (500 mg total) by mouth 2 (two) times daily with a meal. 08/06/17  Yes Derek Jack, MD  omeprazole (PRILOSEC) 20 MG capsule 1 PO 30 mins prior to breakfast 04/05/15  Yes Fields, Sandi L, MD  valsartan-hydrochlorothiazide (DIOVAN-HCT) 160-25 MG tablet Take 1 tablet by mouth daily. 05/05/17  Yes [provider]     Family History  Problem Relation Age of Onset  . Diabetes Mother   . Cancer Father   . Bone cancer Father   . Colon cancer Neg Hx   . Colon polyps  Neg Hx     Social History   Socioeconomic History  . Marital status: Married    Spouse name: Not on file  . Number of children: Not on file  . Years of education: Not on file  . Highest education level: Not on file  Occupational History  . Not on file  Social Needs  . Financial resource strain: Not on file  . Food insecurity:    Worry: Not on file    Inability: Not on file  . Transportation needs:    Medical: Not on file    Non-medical: Not on file  Tobacco Use  . Smoking status: Former Smoker    Last attempt to quit: 04/05/1995    Years since quitting: 22.6  .  Smokeless tobacco: Never Used  Substance and Sexual Activity  . Alcohol use: Not Currently    Alcohol/week: 0.0 oz    Comment: used to drink but stopped about 20 years ago  . Drug use: Never  . Sexual activity: Not on file  Lifestyle  . Physical activity:    Days per week: Not on file    Minutes per session: Not on file  . Stress: Not on file  Relationships  . Social connections:    Talks on phone: Not on file    Gets together: Not on file    Attends religious service: Not on file    Active member of club or organization: Not on file    Attends meetings of clubs or organizations: Not on file    Relationship status: Not on file  Other Topics Concern  . Not on file  Social History Narrative  . Not on file    ECOG Status: 0 - Asymptomatic  Review of Systems: A 12 point ROS discussed and pertinent positives are indicated in the HPI above.  All other systems are negative.  Review of Systems  Constitutional: Negative for activity change, fatigue and unexpected weight change.  Respiratory: Negative.   Cardiovascular: Negative.   Gastrointestinal: Negative.   Genitourinary: Negative.   Neurological: Negative.     Vital Signs: BP 131/80   Pulse 71   Temp 98.1 F (36.7 C) (Oral)   Resp 15   Ht 6' (1.829 m)   Wt 214 lb (97.1 kg)   SpO2 97%   BMI 29.02 kg/m   Physical Exam  Constitutional: He appears well-developed and well-nourished. No distress.  Cardiovascular: Normal rate, regular rhythm and normal heart sounds.  Pulmonary/Chest: Effort normal and breath sounds normal.  Abdominal: Soft. Bowel sounds are normal.  Musculoskeletal: He exhibits no edema.       Imaging: Ct Chest W Contrast  Result Date: 11/11/2017 CLINICAL DATA:  Stage IV colon cancer with liver and lung metastasis. Status post colon resection and chemotherapy. EXAM: CT CHEST, ABDOMEN, AND PELVIS WITH CONTRAST TECHNIQUE: Multidetector CT imaging of the chest, abdomen and pelvis was performed  following the standard protocol during bolus administration of intravenous contrast. CONTRAST:  168m ISOVUE-300 IOPAMIDOL (ISOVUE-300) INJECTION 61% COMPARISON:  08/18/2017 FINDINGS: CT CHEST FINDINGS Cardiovascular: Right Port-A-Cath which terminates at the superior caval/atrial junction. Aortic and branch vessel atherosclerosis. Tortuous thoracic aorta. Mild cardiomegaly, without pericardial effusion. Multivessel coronary artery atherosclerosis. No central pulmonary embolism, on this non-dedicated study. Mediastinum/Nodes: No supraclavicular adenopathy. No mediastinal or hilar adenopathy. Small hiatal hernia. Lungs/Pleura: No pleural fluid. 4 mm left apical pulmonary nodule is unchanged on 22/4. A subpleural 2 mm right upper lobe pulmonary nodule is 68/4. Minimal nodularity along the  right minor fissure is unchanged on 69/4. A left lower lobe 5 mm nodule on 106/4 is unchanged. Musculoskeletal: No acute osseous abnormality. Surgical or posttraumatic changes in the right scapula. CT ABDOMEN PELVIS FINDINGS Hepatobiliary: Heterogeneous hepatic steatosis. This limits evaluation for focal liver lesion. Segment 4 hypoattenuating lesion measures 2.4 x 2.5 cm today versus 3.8 x 2.8 cm on the prior. The lesion questioned in segments 5-8 on the prior exam is less well-defined today. Vague hypoattenuation in this area on 09/03 is favored to represent heterogeneous steatosis. Normal gallbladder, without biliary ductal dilatation. Pancreas: Normal, without mass or ductal dilatation. Spleen: Normal in size, without focal abnormality. Adrenals/Urinary Tract: Normal left adrenal gland. Right adrenal nodule of 9 mm is unchanged. Too small to characterize left renal lesions. Normal right kidney, without hydronephrosis or hydroureter. Normal urinary bladder. Stomach/Bowel: Normal distal stomach. Colonic stool burden suggests constipation. Scattered colonic diverticula. Normal terminal ileum. Surgical sutures at the sigmoid without  locally recurrent disease. Normal small bowel caliber. A right-sided inguinal hernia contains nonobstructive small bowel, as before. Vascular/Lymphatic: Aortic and branch vessel atherosclerosis. No abdominopelvic adenopathy. Reproductive: Normal prostate. Other: No significant free fluid. No evidence of omental or peritoneal disease. Fat containing left inguinal hernia. Musculoskeletal: No acute osseous abnormality. Degenerate disc disease at L4-5. IMPRESSION: CT CHEST IMPRESSION 1. Similar bilateral pulmonary nodules. No new or progressive disease in the chest. 2. No thoracic adenopathy. 3. Coronary artery atherosclerosis. Aortic Atherosclerosis (ICD10-I70.0). CT ABDOMEN AND PELVIS IMPRESSION 1. Decrease size of segment 4 liver lesion. Heterogeneous hepatic steatosis, making evaluation for new liver lesions difficult. 2. No extrahepatic metastatic disease identified within the abdomen or pelvis. 3. Similar nonspecific right adrenal nodule. 4. Bilateral inguinal hernias, with nonobstructive small bowel in the right-sided hernia. Electronically Signed   By: Abigail Miyamoto M.D.   On: 11/11/2017 11:29   Ct Abdomen Pelvis W Contrast  Result Date: 11/11/2017 CLINICAL DATA:  Stage IV colon cancer with liver and lung metastasis. Status post colon resection and chemotherapy. EXAM: CT CHEST, ABDOMEN, AND PELVIS WITH CONTRAST TECHNIQUE: Multidetector CT imaging of the chest, abdomen and pelvis was performed following the standard protocol during bolus administration of intravenous contrast. CONTRAST:  158m ISOVUE-300 IOPAMIDOL (ISOVUE-300) INJECTION 61% COMPARISON:  08/18/2017 FINDINGS: CT CHEST FINDINGS Cardiovascular: Right Port-A-Cath which terminates at the superior caval/atrial junction. Aortic and branch vessel atherosclerosis. Tortuous thoracic aorta. Mild cardiomegaly, without pericardial effusion. Multivessel coronary artery atherosclerosis. No central pulmonary embolism, on this non-dedicated study.  Mediastinum/Nodes: No supraclavicular adenopathy. No mediastinal or hilar adenopathy. Small hiatal hernia. Lungs/Pleura: No pleural fluid. 4 mm left apical pulmonary nodule is unchanged on 22/4. A subpleural 2 mm right upper lobe pulmonary nodule is 68/4. Minimal nodularity along the right minor fissure is unchanged on 69/4. A left lower lobe 5 mm nodule on 106/4 is unchanged. Musculoskeletal: No acute osseous abnormality. Surgical or posttraumatic changes in the right scapula. CT ABDOMEN PELVIS FINDINGS Hepatobiliary: Heterogeneous hepatic steatosis. This limits evaluation for focal liver lesion. Segment 4 hypoattenuating lesion measures 2.4 x 2.5 cm today versus 3.8 x 2.8 cm on the prior. The lesion questioned in segments 5-8 on the prior exam is less well-defined today. Vague hypoattenuation in this area on 09/03 is favored to represent heterogeneous steatosis. Normal gallbladder, without biliary ductal dilatation. Pancreas: Normal, without mass or ductal dilatation. Spleen: Normal in size, without focal abnormality. Adrenals/Urinary Tract: Normal left adrenal gland. Right adrenal nodule of 9 mm is unchanged. Too small to characterize left renal lesions. Normal right kidney,  without hydronephrosis or hydroureter. Normal urinary bladder. Stomach/Bowel: Normal distal stomach. Colonic stool burden suggests constipation. Scattered colonic diverticula. Normal terminal ileum. Surgical sutures at the sigmoid without locally recurrent disease. Normal small bowel caliber. A right-sided inguinal hernia contains nonobstructive small bowel, as before. Vascular/Lymphatic: Aortic and branch vessel atherosclerosis. No abdominopelvic adenopathy. Reproductive: Normal prostate. Other: No significant free fluid. No evidence of omental or peritoneal disease. Fat containing left inguinal hernia. Musculoskeletal: No acute osseous abnormality. Degenerate disc disease at L4-5. IMPRESSION: CT CHEST IMPRESSION 1. Similar bilateral  pulmonary nodules. No new or progressive disease in the chest. 2. No thoracic adenopathy. 3. Coronary artery atherosclerosis. Aortic Atherosclerosis (ICD10-I70.0). CT ABDOMEN AND PELVIS IMPRESSION 1. Decrease size of segment 4 liver lesion. Heterogeneous hepatic steatosis, making evaluation for new liver lesions difficult. 2. No extrahepatic metastatic disease identified within the abdomen or pelvis. 3. Similar nonspecific right adrenal nodule. 4. Bilateral inguinal hernias, with nonobstructive small bowel in the right-sided hernia. Electronically Signed   By: Abigail Miyamoto M.D.   On: 11/11/2017 11:29   Mr Liver W YT Contrast  Result Date: 11/21/2017 CLINICAL DATA:  Metastatic colon cancer.  Evaluate liver disease. EXAM: MRI ABDOMEN WITHOUT AND WITH CONTRAST TECHNIQUE: Multiplanar multisequence MR imaging of the abdomen was performed both before and after the administration of intravenous contrast. CONTRAST:  42m MULTIHANCE GADOBENATE DIMEGLUMINE 529 MG/ML IV SOLN COMPARISON:  CT scan 11/11/2017, 08/18/2017. FINDINGS: Lower chest: No obvious pulmonary lesions are identified on the MRI but the patient has known lung nodules based on the prior chest CT. No pleural or pericardial effusion. Hepatobiliary: Geographic fatty infiltration of the liver with areas of fatty sparing. Again demonstrated is a hepatic metastatic lesion mainly in segment 4B but probably also involving segment 3 and probably extending up into segment 4A. The lesion measures approximately 26.5 x 21.5 mm. This is the somewhat necrotic appearing nonenhancing portion with some early central enhancement. Based on the inphase and out of phase T1 weighted gradient echo sequence there is fatty sparing around the lesion and not actual enhancement. I do not see any other definite hepatic metastatic lesions. The portal and hepatic veins are patent. No intrahepatic biliary dilatation. The gallbladder appears normal. No common bile duct dilatation. Pancreas:   No mass, inflammation or ductal dilatation. Spleen:  Normal size.  No focal lesions. Adrenals/Urinary Tract: The adrenal glands and kidneys are unremarkable. Stomach/Bowel: Visualized portions within the abdomen are unremarkable. Vascular/Lymphatic: No pathologically enlarged lymph nodes identified. No abdominal aortic aneurysm demonstrated. Other:  No ascites or abdominal wall hernia. Musculoskeletal: No significant bony findings. IMPRESSION: 1. Left hepatic lobe metastatic lesion measuring approximately 26.5 x 21.5 mm with surrounding focal fatty sparing. 2. No other metastatic lesions are identified. 3. No upper abdominal lymphadenopathy. 4. Geographic fatty infiltration of the liver. Electronically Signed   By: PMarijo SanesM.D.   On: 11/21/2017 17:55   Ir Radiologist Eval & Mgmt  Result Date: 11/26/2017 Please refer to notes tab for details about interventional procedure. (Op Note)   Labs:  CBC: Recent Labs    10/01/17 0925 10/14/17 1037 10/28/17 1021 11/12/17 0959  WBC 5.2 4.4 5.8 4.8  HGB 11.3* 11.3* 11.8* 11.9*  HCT 35.3* 34.7* 36.2* 36.4*  PLT 207 232 215 188    COAGS: No results for input(s): INR, APTT in the last 8760 hours.  BMP: Recent Labs    09/30/17 1027 10/14/17 1037 10/28/17 1021 11/12/17 0959  NA 138 137 137 137  K 4.2 3.9 4.2 4.1  CL 101 101 100 101  CO2 _0 GLUCOSE 147* 142* 143* 177*  BUN _1 CALCIUM 9.4 9.1 9.2 9.1  CREATININE 1.07 1.07 1.02 1.28*  GFRNONAA >60 >60 >60 57*  GFRAA >60 >60 >60 >60    LIVER FUNCTION TESTS: Recent Labs    09/30/17 1027 10/14/17 1037 10/28/17 1021 11/12/17 0959  BILITOT 0.6 0.4 0.4 0.8  AST _2 ALT _3 ALKPHOS 78 70 74 74  PROT 7.1 7.0 7.0 7.0  ALBUMIN 4.0 4.0 4.0 4.1    TUMOR MARKERS: No results for input(s): AFPTM, CEA, CA199, CHROMGRNA in the last 8760 hours.  Assessment and Plan:  67 year old male with stage IV colon cancer and responded very well to  chemotherapy.  Based on prior imaging and outside reports, the patient has had marked decrease in a left hepatic lesion and no progression of pulmonary nodules.  No other evidence for metastatic disease at this time.  Patient is being evaluated for liver directed therapy.  I reviewed the recent MRI which suggests the disease is isolated to the left hepatic lobe.  There appears to be a lesion or lesions straddling the medial and lateral left hepatic lobe.  The coronal MR images, sequence 21, image 77 suggests there may be 2 adjacent lesions in this area and the craniocaudal dimension measures up to 3.3 cm.  Based on the localized disease in the liver, the patient probably has multiple options.  I explained that he could have a surgical consultation for resection of these adjacent lesions.  Another option would be image guided thermal ablation of the lesions.  We also briefly discussed catheter directed Y 90 radioembolization of the liver.  I would favor ablation or surgical intervention based on the isolated liver disease at this time.  I would prefer to save radioembolization for this patient if he develops diffuse or widespread hepatic disease in the future.  We discussed image thermal ablation of the liver lesion/lesions.  I do not think he has had a tissue diagnosis for the liver metastasis and we discussed possibly getting an ultrasound-guided biopsy prior to any liver directed treatment.  In addition, I would like to discuss the case at GI tumor conference to see if surgery thinks this patient would be a surgical candidate.  I am concerned he had other liver lesions in the past based on review of the prior imaging and he is at high risk for developing new lesions in the future.  At this time, we have tentatively scheduled the patient for an ultrasound-guided liver lesion biopsy but would like to discuss with Dr. Delton Coombes.  US guided biopsy may be helpful to evaluate the disease along the capsule and help  treatment planning.  Plan to discuss this case with surgery.  I would favor image guided thermal ablation of the lesion or adjacent lesions in the left hepatic lobe.  The lesions will require more than one microwave probe and may require repositioning of the probes in order to get the entire lesion.  The procedure could be performed with general anesthesia and usually requires one night in the hospital for observation following the procedure.  Patient and his wife were agreeable to this plan.  Thank you for this interesting consult.  I greatly enjoyed meeting Christopher Friedman and look forward to participating in their care.  A copy of this report was sent to the requesting provider on this date.  Electronically Signed: Burman Riis 11/26/2017, 5:14 PM   I spent a total of  30 Minutes   in face to face in clinical consultation, greater than 50% of which was counseling/coordinating care for metastatic colon cancer and liver directed therapy.

## 2017-11-28 ENCOUNTER — Other Ambulatory Visit: Payer: Self-pay | Admitting: Diagnostic Radiology

## 2017-11-28 ENCOUNTER — Other Ambulatory Visit (HOSPITAL_COMMUNITY): Payer: Self-pay | Admitting: Diagnostic Radiology

## 2017-11-28 DIAGNOSIS — K769 Liver disease, unspecified: Secondary | ICD-10-CM

## 2017-12-03 ENCOUNTER — Other Ambulatory Visit: Payer: Self-pay | Admitting: Radiology

## 2017-12-03 ENCOUNTER — Ambulatory Visit (HOSPITAL_COMMUNITY): Admission: RE | Admit: 2017-12-03 | Payer: Medicare PPO | Source: Ambulatory Visit

## 2017-12-03 NOTE — Patient Instructions (Addendum)
Phyllis Whitefield  12/03/2017   Your procedure is scheduled on: Wednesday 12/10/2017  Report to [facility] Main  Entrance              Report to Radiology  At 0700  AM    Call this number if you have problems the morning of surgery [phone]    Remember: Do not eat food or drink liquids :After Midnight.    How to Manage Your Diabetes Before and After Surgery  Why is it important to control my blood sugar before and after surgery? . Improving blood sugar levels before and after surgery helps healing and can limit problems. . A way of improving blood sugar control is eating a healthy diet by: o  Eating less sugar and carbohydrates o  Increasing activity/exercise o  Talking with your doctor about reaching your blood sugar goals . High blood sugars (greater than 180 mg/dL) can raise your risk of infections and slow your recovery, so you will need to focus on controlling your diabetes during the weeks before surgery. . Make sure that the doctor who takes care of your diabetes knows about your planned surgery including the date and location.  How do I manage my blood sugar before surgery? . Check your blood sugar at least 4 times a day, starting 2 days before surgery, to make sure that the level is not too high or low. o Check your blood sugar the morning of your surgery when you wake up and every 2 hours until you get to the Short Stay unit. . If your blood sugar is less than 70 mg/dL, you will need to treat for low blood sugar: o Do not take insulin. o Treat a low blood sugar (less than 70 mg/dL) with  cup of clear juice (cranberry or apple), 4 glucose tablets, OR glucose gel. o Recheck blood sugar in 15 minutes after treatment (to make sure it is greater than 70 mg/dL). If your blood sugar is not greater than 70 mg/dL on recheck, call [phone] for further instructions. . Report your blood sugar to the short stay nurse when you get to Short Stay.  . If you  are admitted to the hospital after surgery: o Your blood sugar will be checked by the staff and you will probably be given insulin after surgery (instead of oral diabetes medicines) to make sure you have good blood sugar levels. o The goal for blood sugar control after surgery is 80-180 mg/dL.   WHAT DO I DO ABOUT MY DIABETES MEDICATION?       The day before surgery, Take Metformin (Glucophage) as usual.         The day before surgery, Take Glipizide (Glucotrol) as usual.  . Do not take oral diabetes medicines (pills) the morning of surgery.     Take these medicines the morning of surgery with A SIP OF WATER: Atorvastatin (Lipitor)   DO NOT TAKE ANY DIABETIC MEDICATIONS DAY OF YOUR SURGERY                               You may not have any metal on your body including hair pins and              piercings  Do not wear jewelry, make-up, lotions, powders or perfumes, deodorant  Men may shave face and neck.   Do not bring valuables to the hospital. [place].  Contacts, dentures or bridgework may not be worn into surgery.  Leave suitcase in the car. After surgery it may be brought to your room.                  Please read over the following fact sheets you were given: _____________________________________________________________________             [facility] - Preparing for Surgery Before surgery, you can play an important role.  Because skin is not sterile, your skin needs to be as free of germs as possible.  You can reduce the number of germs on your skin by washing with CHG (chlorahexidine gluconate) soap before surgery.  CHG is an antiseptic cleaner which kills germs and bonds with the skin to continue killing germs even after washing. Please DO NOT use if you have an allergy to CHG or antibacterial soaps.  If your skin becomes reddened/irritated stop using the CHG and inform your nurse when you arrive at  Short Stay. Do not shave (including legs and underarms) for at least 48 hours prior to the first CHG shower.  You may shave your face/neck. Please follow these instructions carefully:  1.  Shower with CHG Soap the night before surgery and the  morning of Surgery.  2.  If you choose to wash your hair, wash your hair first as usual with your  normal  shampoo.  3.  After you shampoo, rinse your hair and body thoroughly to remove the  shampoo.                           4.  Use CHG as you would any other liquid soap.  You can apply chg directly  to the skin and wash                       Gently with a scrungie or clean washcloth.  5.  Apply the CHG Soap to your body ONLY FROM THE NECK DOWN.   Do not use on face/ open                           Wound or open sores. Avoid contact with eyes, ears mouth and genitals (private parts).                       Wash face,  Genitals (private parts) with your normal soap.             6.  Wash thoroughly, paying special attention to the area where your surgery  will be performed.  7.  Thoroughly rinse your body with warm water from the neck down.  8.  DO NOT shower/wash with your normal soap after using and rinsing off  the CHG Soap.                9.  Pat yourself dry with a clean towel.            10.  Wear clean pajamas.            11.  Place clean sheets on your bed the night of your first shower and do not  sleep with  pets. Day of Surgery : Do not apply any lotions/deodorants the morning of surgery.  Please wear clean clothes to the hospital/surgery center.  FAILURE TO FOLLOW THESE INSTRUCTIONS MAY RESULT IN THE CANCELLATION OF YOUR SURGERY PATIENT SIGNATURE_________________________________  NURSE SIGNATURE__________________________________  ________________________________________________________________________

## 2017-12-04 ENCOUNTER — Other Ambulatory Visit: Payer: Self-pay

## 2017-12-04 ENCOUNTER — Encounter (HOSPITAL_COMMUNITY): Payer: Self-pay

## 2017-12-04 ENCOUNTER — Encounter (HOSPITAL_COMMUNITY)
Admission: RE | Admit: 2017-12-04 | Discharge: 2017-12-04 | Disposition: A | Discharge status: Home / Self Care | Payer: Medicare PPO | Source: Ambulatory Visit | Attending: Diagnostic Radiology | Admitting: Diagnostic Radiology

## 2017-12-04 ENCOUNTER — Encounter (HOSPITAL_COMMUNITY): Payer: Self-pay | Admitting: Hematology

## 2017-12-04 ENCOUNTER — Inpatient Hospital Stay (HOSPITAL_COMMUNITY): Payer: Medicare PPO

## 2017-12-04 ENCOUNTER — Inpatient Hospital Stay (HOSPITAL_COMMUNITY): Payer: Medicare PPO | Attending: Hematology | Admitting: Hematology

## 2017-12-04 VITALS — BP 124/70 | HR 66 | Temp 98.2°F | Resp 18 | Wt 218.8 lb

## 2017-12-04 DIAGNOSIS — E1142 Type 2 diabetes mellitus with diabetic polyneuropathy: Secondary | ICD-10-CM | POA: Diagnosis not present

## 2017-12-04 DIAGNOSIS — C189 Malignant neoplasm of colon, unspecified: Secondary | ICD-10-CM

## 2017-12-04 DIAGNOSIS — C787 Secondary malignant neoplasm of liver and intrahepatic bile duct: Secondary | ICD-10-CM | POA: Diagnosis not present

## 2017-12-04 DIAGNOSIS — C7800 Secondary malignant neoplasm of unspecified lung: Secondary | ICD-10-CM

## 2017-12-04 DIAGNOSIS — Z87891 Personal history of nicotine dependence: Secondary | ICD-10-CM | POA: Insufficient documentation

## 2017-12-04 DIAGNOSIS — Z01812 Encounter for preprocedural laboratory examination: Secondary | ICD-10-CM | POA: Insufficient documentation

## 2017-12-04 DIAGNOSIS — I10 Essential (primary) hypertension: Secondary | ICD-10-CM | POA: Diagnosis not present

## 2017-12-04 DIAGNOSIS — Z01810 Encounter for preprocedural cardiovascular examination: Secondary | ICD-10-CM | POA: Insufficient documentation

## 2017-12-04 DIAGNOSIS — R9431 Abnormal electrocardiogram [ECG] [EKG]: Secondary | ICD-10-CM | POA: Insufficient documentation

## 2017-12-04 HISTORY — DX: Essential (primary) hypertension: I10

## 2017-12-04 HISTORY — DX: Gastro-esophageal reflux disease without esophagitis: K21.9

## 2017-12-04 LAB — CBC WITH DIFFERENTIAL/PLATELET
BASOS ABS: 0.1 10*3/uL (ref 0.0–0.1)
BASOS PCT: 1 %
Basophils Absolute: 0.1 10*3/uL (ref 0.0–0.1)
Basophils Relative: 1 %
EOS ABS: 0.2 10*3/uL (ref 0.0–0.7)
EOS ABS: 0.1 10*3/uL (ref 0.0–0.7)
EOS PCT: 1 %
Eosinophils Relative: 2 %
HCT: 38 % — ABNORMAL LOW (ref 39.0–52.0)
HEMATOCRIT: 38.8 % — AB (ref 39.0–52.0)
HEMOGLOBIN: 12.4 g/dL — AB (ref 13.0–17.0)
Hemoglobin: 12.8 g/dL — ABNORMAL LOW (ref 13.0–17.0)
LYMPHS ABS: 2.8 10*3/uL (ref 0.7–4.0)
Lymphocytes Relative: 30 %
Lymphocytes Relative: 36 %
Lymphs Abs: 2.1 10*3/uL (ref 0.7–4.0)
MCH: 31.1 pg (ref 26.0–34.0)
MCH: 32 pg (ref 26.0–34.0)
MCHC: 32 g/dL (ref 30.0–36.0)
MCHC: 33.7 g/dL (ref 30.0–36.0)
MCV: 97.2 fL (ref 78.0–100.0)
MCV: 95 fL (ref 78.0–100.0)
MONO ABS: 0.5 10*3/uL (ref 0.1–1.0)
MONOS PCT: 10 %
MONOS PCT: 6 %
Monocytes Absolute: 0.7 10*3/uL (ref 0.1–1.0)
NEUTROS ABS: 4.1 10*3/uL (ref 1.7–7.7)
NEUTROS PCT: 57 %
Neutro Abs: 4.4 10*3/uL (ref 1.7–7.7)
Neutrophils Relative %: 56 %
PLATELETS: 169 10*3/uL (ref 150–400)
Platelets: 157 10*3/uL (ref 150–400)
RBC: 3.99 MIL/uL — ABNORMAL LOW (ref 4.22–5.81)
RBC: 4 MIL/uL — ABNORMAL LOW (ref 4.22–5.81)
RDW: 14.9 % (ref 11.5–15.5)
RDW: 14.8 % (ref 11.5–15.5)
WBC: 7.1 10*3/uL (ref 4.0–10.5)
WBC: 7.8 10*3/uL (ref 4.0–10.5)

## 2017-12-04 LAB — COMPREHENSIVE METABOLIC PANEL
ALBUMIN: 4.2 g/dL (ref 3.5–5.0)
ALK PHOS: 69 U/L (ref 38–126)
ALK PHOS: 70 U/L (ref 38–126)
ALT: 29 U/L (ref 0–44)
ALT: 28 U/L (ref 0–44)
ANION GAP: 8 (ref 5–15)
ANION GAP: 11 (ref 5–15)
AST: 30 U/L (ref 15–41)
AST: 27 U/L (ref 15–41)
Albumin: 4.6 g/dL (ref 3.5–5.0)
BILIRUBIN TOTAL: 0.6 mg/dL (ref 0.3–1.2)
BUN: 19 mg/dL (ref 8–23)
BUN: 18 mg/dL (ref 8–23)
CALCIUM: 9.1 mg/dL (ref 8.9–10.3)
CALCIUM: 9.7 mg/dL (ref 8.9–10.3)
CHLORIDE: 102 mmol/L (ref 98–111)
CO2: 26 mmol/L (ref 22–32)
CO2: 27 mmol/L (ref 22–32)
Chloride: 101 mmol/L (ref 98–111)
Creatinine, Ser: 1.14 mg/dL (ref 0.61–1.24)
Creatinine, Ser: 1.13 mg/dL (ref 0.61–1.24)
GFR calc Af Amer: >60 mL/min (ref >60)
GFR calc Af Amer: >60 mL/min (ref >60)
GFR calc non Af Amer: >60 mL/min (ref >60)
GFR calc non Af Amer: >60 mL/min (ref >60)
Glucose, Bld: 184 mg/dL — ABNORMAL HIGH (ref 70–99)
Glucose, Bld: 111 mg/dL — ABNORMAL HIGH (ref 70–99)
Potassium: 4.1 mmol/L (ref 3.5–5.1)
Potassium: 4.3 mmol/L (ref 3.5–5.1)
SODIUM: 135 mmol/L (ref 135–145)
SODIUM: 140 mmol/L (ref 135–145)
Total Bilirubin: 0.5 mg/dL (ref 0.3–1.2)
Total Protein: 7.1 g/dL (ref 6.5–8.1)
Total Protein: 7.6 g/dL (ref 6.5–8.1)

## 2017-12-04 LAB — GLUCOSE, CAPILLARY: Glucose-Capillary: 101 mg/dL — ABNORMAL HIGH (ref 70–99)

## 2017-12-04 LAB — PROTIME-INR
INR: 0.94
Prothrombin Time: 12.5 seconds (ref 11.4–15.2)

## 2017-12-04 LAB — HEMOGLOBIN A1C
Hgb A1c MFr Bld: 7.3 % — ABNORMAL HIGH (ref 4.8–5.6)
MEAN PLASMA GLUCOSE: 162.81 mg/dL

## 2017-12-04 LAB — ABO/RH: ABO/RH(D): A POS

## 2017-12-04 NOTE — Progress Notes (Signed)
11/11/2017- noted in Epic-CT chest w/ contrast

## 2017-12-04 NOTE — Progress Notes (Signed)
   12/04/17 1345  OBSTRUCTIVE SLEEP APNEA  Have you ever been diagnosed with sleep apnea through a sleep study? No  Do you snore loudly (loud enough to be heard through closed doors)?  1  Do you often feel tired, fatigued, or sleepy during the daytime (such as falling asleep during driving or talking to someone)? 0  Has anyone observed you stop breathing during your sleep? 1  Do you have, or are you being treated for high blood pressure? 1  BMI more than 35 kg/m2? 0  Age > 50 (1-yes) 1  Neck circumference greater than:Male 16 inches or larger, Male 17inches or larger? 0  Male Gender (Yes=1) 1  Obstructive Sleep Apnea Score 5  Score 5 or greater  Results sent to PCP

## 2017-12-04 NOTE — Patient Instructions (Signed)
[facility] Discharge Instructions  Follow up in 4 weeks with labs    Thank you for choosing [facility] at [facility] to provide your oncology and hematology care.  To afford each patient quality time with our provider, please arrive at least 15 minutes before your scheduled appointment time.   If you have a lab appointment with the Cancer Center please come in thru the  Main Entrance and check in at the main information desk  You need to re-schedule your appointment should you arrive 10 or more minutes late.  We strive to give you quality time with our providers, and arriving late affects you and other patients whose appointments are after yours.  Also, if you no show three or more times for appointments you may be dismissed from the clinic at the providers discretion.     Again, thank you for choosing [facility].  Our hope is that these requests will decrease the amount of time that you wait before being seen by our physicians.       _____________________________________________________________  Should you have questions after your visit to [facility], please contact our office at [phone] between the hours of 8:00 a.m. and 4:30 p.m.  Voicemails left after 4:00 p.m. will not be returned until the following business day.  For prescription refill requests, have your pharmacy contact our office and allow 72 hours.    Cancer Center Support Programs:   > Cancer Support Group  2nd Tuesday of the month 1pm-2pm, Journey Room    

## 2017-12-04 NOTE — Assessment & Plan Note (Addendum)
1.  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  He tolerated it very well.  We discussed the results of the CT scan CAP which showed a stable left upper lobe subcentimeter lung nodule.  Segment 4 lesion in the liver has decreased to 2.4 cm.  No other clearly evident liver lesions present.  At this time we discussed the option of giving him a break from chemotherapy versus putting him on maintenance with 5-FU infusion/Xeloda. -He was evaluated by Dr. Anselm Pancoast, who thought microwave thermal ablation would be the best procedure for him.  He would like to save  Y 90 for later recurrences.  An MRI of the liver dated 11/21/2017 shows left hepatic lobe lesion measuring 2.6 x 2.1 cm.  No other lesions were seen. -He is scheduled for microwave thermal ablation on 12/10/2017.  I will see him back in 4 weeks for follow-up and blood work.  2.  Diabetes: He continues to take metformin 500 mg daily along with glipizide once daily.  He gets diarrhea if he increases the dose of metformin.  He is also continuing Lipitor.  3.  Peripheral neuropathy: Numbness in the feet, predominantly at bedtime is stable.  4.  Hypertension: He will continue valsartan HCTZ 160-22m daily.

## 2017-12-04 NOTE — Progress Notes (Signed)
[place] [address] [place], [place] [address]   CLINIC:  Medical Oncology/Hematology  PCP:  [name], MD [place] [address] [phone]   REASON FOR VISIT:  Follow-up for adenocarcinoma of the colon with mets to the liver  CURRENT THERAPY: Being planned for microwave ablation of the liver lesion.  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 ([place])   05/27/2017 -  Chemotherapy    The patient had palonosetron (ALOXI) injection 0.25 mg, 0.25 mg, Intravenous,  Once, 12 of 12 cycles Administration: 0.25 mg (09/03/2017), 0.25 mg (10/01/2017), 0.25 mg (10/15/2017), 0.25 mg (10/29/2017), 0.25 mg (09/17/2017) irinotecan (CAMPTOSAR) 400 mg in dextrose 5 % 500 mL chemo infusion, 180 mg/m2, Intravenous,  Once, 12 of 12 cycles Dose modification: 144 mg/m2 (Cycle 4, Reason: Provider Judgment) Administration: 320 mg (09/03/2017), 320 mg (10/01/2017), 320 mg (10/15/2017), 320 mg (10/29/2017), 320 mg (09/17/2017) leucovorin 888 mg in dextrose 5 % 250 mL infusion, 400 mg/m2, Intravenous,  Once, 12 of 12 cycles Administration: 900 mg (09/03/2017), 900 mg (10/01/2017), 900 mg (10/15/2017), 900 mg (10/29/2017), 900 mg (09/17/2017) fluorouracil (ADRUCIL) chemo injection 900 mg, 400 mg/m2, Intravenous,  Once, 12 of 12 cycles Dose modification: 320 mg/m2 (Cycle 4, Reason: Provider Judgment) Administration: 700 mg (09/03/2017), 700 mg (10/01/2017), 700 mg (10/15/2017), 700 mg (10/29/2017), 700 mg (09/17/2017) fluorouracil (ADRUCIL) 5,350 mg in sodium chloride 0.9 % 143 mL chemo infusion, 2,400 mg/m2, Intravenous, 1 Day/Dose, 12 of 12 cycles Dose modification: 1,920 mg/m2 (Cycle 4, Reason: Provider Judgment) Administration: 4,250 mg (09/03/2017), 4,250 mg (10/01/2017), 4,250 mg (10/15/2017), 4,250 mg (10/29/2017), 4,250 mg (09/17/2017)  for chemotherapy treatment.     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 Blomquist 67 y.o. male returns for routine follow-up for metastatic colon cancer to the liver. Patient is here today and doing well. Patient has his procedure on 12/10/17 for the microthermal ablation. He has his pre-Op appointment today. He has very few complaints today. Patient is remaining active and denies any nausea, vomiting, or diarrhea. Denies any new pains. Denies any SOB. His appetite and energy level are good at 100%.  REVIEW OF SYSTEMS:  Review of Systems  All other systems reviewed and are negative.    PAST MEDICAL/SURGICAL HISTORY:  Past Medical History:  Diagnosis Date  . Colon cancer ([place])   . Diabetes ([place])   . GERD (gastroesophageal reflux disease)   . Hypertension    Past Surgical History:  Procedure Laterality Date  . APPENDECTOMY     during colon surgery  . COLON SURGERY    . IR RADIOLOGIST EVAL & MGMT  11/26/2017  . SHOULDER SURGERY  2003     SOCIAL HISTORY:  Social History   Socioeconomic History  . Marital status: Married    Spouse name: Not on file  . Number of children: Not on file  . Years of education: Not on file  . Highest education level: Not on file  Occupational History  . Not on file  Social Needs  . Financial resource strain: Not on file  . Food insecurity:    Worry: Not on file  Inability: Not on file  . Transportation needs:    Medical: Not on file    Non-medical: Not on file  Tobacco Use  . Smoking status: Former Smoker    Last attempt to quit: 04/05/1995    Years since quitting: 22.6  . Smokeless tobacco: Never Used  Substance and Sexual Activity  . Alcohol use: Not Currently    Alcohol/week: 0.0 standard drinks    Comment: used to drink but stopped about 20 years ago  . Drug  use: Never  . Sexual activity: Not on file  Lifestyle  . Physical activity:    Days per week: Not on file    Minutes per session: Not on file  . Stress: Not on file  Relationships  . Social connections:    Talks on phone: Not on file    Gets together: Not on file    Attends religious service: Not on file    Active member of club or organization: Not on file    Attends meetings of clubs or organizations: Not on file    Relationship status: Not on file  . Intimate partner violence:    Fear of current or ex partner: Not on file    Emotionally abused: Not on file    Physically abused: Not on file    Forced sexual activity: Not on file  Other Topics Concern  . Not on file  Social History Narrative  . Not on file    FAMILY HISTORY:  Family History  Problem Relation Age of Onset  . Diabetes Mother   . Cancer Father   . Bone cancer Father   . Colon cancer Neg Hx   . Colon polyps Neg Hx     CURRENT MEDICATIONS:  Outpatient Encounter Medications as of 12/04/2017  Medication Sig  . Alpha-Lipoic Acid 200 MG CAPS Take 1 capsule by mouth daily.  . Ascorbic Acid (VITAMIN C) 1000 MG tablet Take 1,000 mg by mouth daily.  . ASHWAGANDHA PO Take 800 mg by mouth daily.  Marland Kitchen aspirin 81 MG tablet Take 81 mg by mouth daily.  Marland Kitchen atorvastatin (LIPITOR) 10 MG tablet Take 10 mg by mouth daily.   Marland Kitchen BEE POLLEN PO Take 1 capsule by mouth daily.   . Blood Glucose Monitoring Suppl (ONE TOUCH ULTRA MINI) W/DEVICE KIT USE TO CHECK BLOOD SUGAR DAILY.  . cholecalciferol (VITAMIN D) 1000 units tablet Take 1,000 Units by mouth daily.  Marland Kitchen CINNAMON PO Take 1,000 mg by mouth daily.  Marland Kitchen Cod Liver Oil CAPS Take 1 capsule by mouth daily.  . Coenzyme Q10 (CO Q 10) 100 MG CAPS Take 1 capsule by mouth daily.   . Cyanocobalamin (VITAMIN B-12) 5000 MCG TBDP Take 5,000 mcg by mouth daily.  . Flax Oil-Fish Oil-Borage Oil (FISH OIL-FLAX OIL-BORAGE OIL) CAPS Take 1 capsule by mouth daily.  . Garlic 6256 MG CAPS Take 1  capsule by mouth daily.  Marland Kitchen glipiZIDE (GLUCOTROL XL) 10 MG 24 hr tablet Take 10 mg by mouth daily.   . Glucosamine-Chondroit-Collagen ([employer] GLUCO-CHONDROIT PLUS UC-II PO) Take 1 tablet by mouth daily.   Marland Kitchen LECITHIN PO Take 1 capsule by mouth daily.  . magnesium oxide (MAG-OX) 400 MG tablet Take 400 mg by mouth daily.   . metFORMIN (GLUCOPHAGE) 500 MG tablet Take 1 tablet (500 mg total) by mouth 2 (two) times daily with a meal. (Patient taking differently: Take 500 mg by mouth daily with breakfast. )  . milk thistle 175 MG tablet Take 175  mg by mouth daily.  Marland Kitchen OVER THE COUNTER MEDICATION Take 1 capsule by mouth daily. Mushroom Complex 750 mg  . OVER THE COUNTER MEDICATION Take 1 capsule by mouth daily. Cura Med 750 mg  . valsartan-hydrochlorothiazide (DIOVAN-HCT) 160-25 MG tablet Take 1 tablet by mouth daily.   No facility-administered encounter medications on file as of 12/04/2017.     ALLERGIES:  No Known Allergies   PHYSICAL EXAM:  ECOG Performance status: 1  Vitals:   12/04/17 0921  BP: 124/70  Pulse: 66  Resp: 18  Temp: 98.2 F (36.8 C)  SpO2: 99%   Filed Weights   12/04/17 0921  Weight: 218 lb 12.8 oz (99.2 kg)    Physical Exam  Constitutional: He is oriented to person, place, and time. He appears well-developed and well-nourished.  Cardiovascular: Normal rate, regular rhythm and normal heart sounds.  Pulmonary/Chest: Effort normal and breath sounds normal.  Neurological: He is alert and oriented to person, place, and time.  Skin: Skin is warm and dry.     LABORATORY DATA:  I have reviewed the labs as listed.  CBC    Component Value Date/Time   WBC 7.8 12/04/2017 1337   RBC 4.00 (L) 12/04/2017 1337   HGB 12.8 (L) 12/04/2017 1337   HCT 38.0 (L) 12/04/2017 1337   PLT 169 12/04/2017 1337   MCV 95.0 12/04/2017 1337   MCH 32.0 12/04/2017 1337   MCHC 33.7 12/04/2017 1337   RDW 14.8 12/04/2017 1337   LYMPHSABS 2.8 12/04/2017 1337   MONOABS 0.5 12/04/2017 1337    EOSABS 0.1 12/04/2017 1337   BASOSABS 0.1 12/04/2017 1337   CMP Latest Ref Rng & Units 12/04/2017 12/04/2017 11/12/2017  Glucose 70 - 99 mg/dL 111(H) 184(H) 177(H)  BUN 8 - 23 mg/dL _0 Creatinine 0.61 - 1.24 mg/dL 1.13 1.14 1.28(H)  Sodium 135 - 145 mmol/L 140 135 137  Potassium 3.5 - 5.1 mmol/L 4.3 4.1 4.1  Chloride 98 - 111 mmol/L 102 101 101  CO2 22 - 32 mmol/L _1 Calcium 8.9 - 10.3 mg/dL 9.7 9.1 9.1  Total Protein 6.5 - 8.1 g/dL 7.6 7.1 7.0  Total Bilirubin 0.3 - 1.2 mg/dL 0.5 0.6 0.8  Alkaline Phos 38 - 126 U/L 70 69 74  AST 15 - 41 U/L _2 ALT 0 - 44 U/L _3 DIAGNOSTIC IMAGING:  I reviewed the MRI of the liver dated 11/21/2017 which shows left hepatic lobe mass measuring 2.6 x 2.1 cm.     ASSESSMENT & PLAN:   Adenocarcinoma of colon metastatic to liver ([place]) 1.  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  He tolerated it very well.  We discussed the results of the CT scan CAP which showed a stable left upper lobe subcentimeter lung nodule.  Segment 4 lesion in the liver has decreased to 2.4 cm.  No other clearly evident liver lesions present.  At this time we discussed the option of giving him a break from chemotherapy versus putting him on maintenance with 5-FU infusion/Xeloda. -He was evaluated by Dr. Anselm Pancoast, who thought microwave thermal ablation would  be the best procedure for him.  He would like to save  Y 90 for later recurrences.  An MRI of the liver dated 11/21/2017 shows left hepatic lobe lesion measuring 2.6 x 2.1 cm.  No other lesions were seen. -He is scheduled for microwave thermal ablation on 12/10/2017.  I will see him back in 4 weeks for  follow-up and blood work.  2.  Diabetes: He continues to take metformin 500 mg daily along with glipizide once daily.  He gets diarrhea if he increases the dose of metformin.  He is also continuing Lipitor.  3.  Peripheral neuropathy: Numbness in the feet, predominantly at bedtime is stable.  4.  Hypertension: He will continue valsartan HCTZ 160-75m daily.      Orders placed this encounter:  Orders Placed This Encounter  Procedures  . CEA  . CBC with Differential/Platelet  . Comprehensive metabolic panel      SDerek Jack MD [phone]

## 2017-12-05 LAB — CEA: CEA: 3.3 ng/mL (ref 0.0–4.7)

## 2017-12-05 NOTE — Progress Notes (Signed)
Spoke to Dr. Gifford Shave, MDA about patient's medical history and  concerning Anesthesia consult from Interventional Radiology for patient's surgery on 12/10/2017. Per Dr. Gifford Shave, MDA, Anesthesia will see patient morning of surgery.

## 2017-12-09 ENCOUNTER — Other Ambulatory Visit: Payer: Self-pay | Admitting: Physician Assistant

## 2017-12-09 NOTE — Anesthesia Preprocedure Evaluation (Addendum)
Anesthesia Evaluation  Patient identified by MRN, date of birth, ID band Patient awake    Reviewed: Allergy & Precautions, NPO status , Patient's Chart, lab work & pertinent test results  Airway Mallampati: I  TM Distance: >3 FB Neck ROM: Full    Dental  (+) Dental Advisory Given, Loose, Missing,    Pulmonary neg pulmonary ROS, former smoker,    breath sounds clear to auscultation       Cardiovascular hypertension, Pt. on medications negative cardio ROS   Rhythm:Regular Rate:Normal     Neuro/Psych negative neurological ROS  negative psych ROS   GI/Hepatic negative GI ROS, Neg liver ROS, GERD  Medicated,Colon cancer with liver and lung mets   Endo/Other  diabetes, Type 2, Oral Hypoglycemic Agents  Renal/GU negative Renal ROS  negative genitourinary   Musculoskeletal negative musculoskeletal ROS (+)   Abdominal   Peds  Hematology negative hematology ROS (+)   Anesthesia Other Findings   Reproductive/Obstetrics                            Anesthesia Physical Anesthesia Plan  ASA: III  Anesthesia Plan: General   Post-op Pain Management:    Induction: Intravenous  PONV Risk Score and Plan: 2 and Dexamethasone and Ondansetron  Airway Management Planned: Oral ETT  Additional Equipment:   Intra-op Plan:   Post-operative Plan: Extubation in OR  Informed Consent: I have reviewed the patients History and Physical, chart, labs and discussed the procedure including the risks, benefits and alternatives for the proposed anesthesia with the patient or authorized representative who has indicated his/her understanding and acceptance.   Dental advisory given  Plan Discussed with: CRNA  Anesthesia Plan Comments:        Anesthesia Quick Evaluation

## 2017-12-10 ENCOUNTER — Ambulatory Visit (HOSPITAL_COMMUNITY): Payer: Medicare PPO | Admitting: Anesthesiology

## 2017-12-10 ENCOUNTER — Observation Stay (HOSPITAL_COMMUNITY)
Admission: RE | Admit: 2017-12-10 | Discharge: 2017-12-11 | Disposition: A | Discharge status: Home / Self Care | Payer: Medicare PPO | Source: Ambulatory Visit | Attending: Diagnostic Radiology | Admitting: Diagnostic Radiology

## 2017-12-10 ENCOUNTER — Encounter (HOSPITAL_COMMUNITY)
Admission: RE | Disposition: A | Discharge status: Home / Self Care | Payer: Self-pay | Source: Ambulatory Visit | Attending: Diagnostic Radiology

## 2017-12-10 ENCOUNTER — Encounter (HOSPITAL_COMMUNITY): Payer: Self-pay

## 2017-12-10 ENCOUNTER — Other Ambulatory Visit: Payer: Self-pay

## 2017-12-10 ENCOUNTER — Ambulatory Visit (HOSPITAL_COMMUNITY)
Admission: RE | Admit: 2017-12-10 | Discharge: 2017-12-10 | Disposition: A | Discharge status: Home / Self Care | Payer: Medicare PPO | Source: Ambulatory Visit | Attending: Diagnostic Radiology | Admitting: Diagnostic Radiology

## 2017-12-10 DIAGNOSIS — Z833 Family history of diabetes mellitus: Secondary | ICD-10-CM | POA: Insufficient documentation

## 2017-12-10 DIAGNOSIS — Z9049 Acquired absence of other specified parts of digestive tract: Secondary | ICD-10-CM | POA: Insufficient documentation

## 2017-12-10 DIAGNOSIS — K449 Diaphragmatic hernia without obstruction or gangrene: Secondary | ICD-10-CM | POA: Diagnosis not present

## 2017-12-10 DIAGNOSIS — K760 Fatty (change of) liver, not elsewhere classified: Secondary | ICD-10-CM | POA: Insufficient documentation

## 2017-12-10 DIAGNOSIS — K4020 Bilateral inguinal hernia, without obstruction or gangrene, not specified as recurrent: Secondary | ICD-10-CM | POA: Insufficient documentation

## 2017-12-10 DIAGNOSIS — K7689 Other specified diseases of liver: Secondary | ICD-10-CM | POA: Diagnosis not present

## 2017-12-10 DIAGNOSIS — Z7984 Long term (current) use of oral hypoglycemic drugs: Secondary | ICD-10-CM | POA: Insufficient documentation

## 2017-12-10 DIAGNOSIS — Z808 Family history of malignant neoplasm of other organs or systems: Secondary | ICD-10-CM | POA: Insufficient documentation

## 2017-12-10 DIAGNOSIS — C189 Malignant neoplasm of colon, unspecified: Secondary | ICD-10-CM | POA: Diagnosis not present

## 2017-12-10 DIAGNOSIS — Z79899 Other long term (current) drug therapy: Secondary | ICD-10-CM | POA: Diagnosis not present

## 2017-12-10 DIAGNOSIS — Z87891 Personal history of nicotine dependence: Secondary | ICD-10-CM | POA: Insufficient documentation

## 2017-12-10 DIAGNOSIS — C7802 Secondary malignant neoplasm of left lung: Secondary | ICD-10-CM | POA: Diagnosis not present

## 2017-12-10 DIAGNOSIS — Z809 Family history of malignant neoplasm, unspecified: Secondary | ICD-10-CM | POA: Diagnosis not present

## 2017-12-10 DIAGNOSIS — C779 Secondary and unspecified malignant neoplasm of lymph node, unspecified: Secondary | ICD-10-CM | POA: Insufficient documentation

## 2017-12-10 DIAGNOSIS — E119 Type 2 diabetes mellitus without complications: Secondary | ICD-10-CM | POA: Diagnosis not present

## 2017-12-10 DIAGNOSIS — I700 Atherosclerosis of aorta: Secondary | ICD-10-CM | POA: Diagnosis not present

## 2017-12-10 DIAGNOSIS — I10 Essential (primary) hypertension: Secondary | ICD-10-CM | POA: Diagnosis not present

## 2017-12-10 DIAGNOSIS — Z9221 Personal history of antineoplastic chemotherapy: Secondary | ICD-10-CM | POA: Diagnosis not present

## 2017-12-10 DIAGNOSIS — Z7982 Long term (current) use of aspirin: Secondary | ICD-10-CM | POA: Diagnosis not present

## 2017-12-10 DIAGNOSIS — K219 Gastro-esophageal reflux disease without esophagitis: Secondary | ICD-10-CM | POA: Insufficient documentation

## 2017-12-10 DIAGNOSIS — K769 Liver disease, unspecified: Secondary | ICD-10-CM

## 2017-12-10 DIAGNOSIS — C787 Secondary malignant neoplasm of liver and intrahepatic bile duct: Secondary | ICD-10-CM | POA: Insufficient documentation

## 2017-12-10 HISTORY — PX: RADIOFREQUENCY ABLATION: SHX2290

## 2017-12-10 LAB — TYPE AND SCREEN
ABO/RH(D): A POS
Antibody Screen: NEGATIVE

## 2017-12-10 LAB — GLUCOSE, CAPILLARY
GLUCOSE-CAPILLARY: 109 mg/dL — AB (ref 70–99)
GLUCOSE-CAPILLARY: 141 mg/dL — AB (ref 70–99)
GLUCOSE-CAPILLARY: 201 mg/dL — AB (ref 70–99)
Glucose-Capillary: 236 mg/dL — ABNORMAL HIGH (ref 70–99)

## 2017-12-10 SURGERY — RADIO FREQUENCY ABLATION
Anesthesia: General

## 2017-12-10 MED ORDER — SENNOSIDES-DOCUSATE SODIUM 8.6-50 MG PO TABS: 1.0000 | ORAL_TABLET | Freq: Every evening | ORAL | Status: DC | PRN

## 2017-12-10 MED ORDER — PIPERACILLIN-TAZOBACTAM 3.375 G IVPB
INTRAVENOUS | Status: AC
  Filled 2017-12-10: qty 50

## 2017-12-10 MED ORDER — PHENYLEPHRINE HCL-NACL 10-0.9 MG/250ML-% IV SOLN
INTRAVENOUS | Status: AC
  Filled 2017-12-10: qty 250

## 2017-12-10 MED ORDER — FENTANYL CITRATE (PF) 100 MCG/2ML IJ SOLN
INTRAMUSCULAR | Status: DC | PRN
  Administered 2017-12-10 (×3): 50 ug via INTRAVENOUS

## 2017-12-10 MED ORDER — FENTANYL CITRATE (PF) 100 MCG/2ML IJ SOLN: 25.0000 ug | INTRAMUSCULAR | Status: DC | PRN

## 2017-12-10 MED ORDER — IOPAMIDOL (ISOVUE-300) INJECTION 61%: 13.0000 mL | Freq: Once | INTRAVENOUS | Status: DC | PRN

## 2017-12-10 MED ORDER — SODIUM CHLORIDE 0.9 % IV SOLN
INTRAVENOUS | Status: AC
  Filled 2017-12-10: qty 1000

## 2017-12-10 MED ORDER — FENTANYL CITRATE (PF) 100 MCG/2ML IJ SOLN
INTRAMUSCULAR | Status: AC
  Filled 2017-12-10: qty 2

## 2017-12-10 MED ORDER — IOPAMIDOL (ISOVUE-370) INJECTION 76%
75.0000 mL | Freq: Once | INTRAVENOUS | Status: AC | PRN
  Administered 2017-12-10: 75 mL via INTRAVENOUS

## 2017-12-10 MED ORDER — LIDOCAINE 2% (20 MG/ML) 5 ML SYRINGE
INTRAMUSCULAR | Status: DC | PRN
  Administered 2017-12-10: 80 mg via INTRAVENOUS

## 2017-12-10 MED ORDER — OXYCODONE HCL 5 MG PO TABS
5.0000 mg | ORAL_TABLET | ORAL | Status: DC | PRN
  Administered 2017-12-10 – 2017-12-11 (×3): 5 mg via ORAL
  Filled 2017-12-10 (×3): qty 1

## 2017-12-10 MED ORDER — IOPAMIDOL (ISOVUE-370) INJECTION 76%
INTRAVENOUS | Status: AC
  Filled 2017-12-10: qty 100

## 2017-12-10 MED ORDER — DOCUSATE SODIUM 100 MG PO CAPS
100.0000 mg | ORAL_CAPSULE | Freq: Two times a day (BID) | ORAL | Status: DC
  Administered 2017-12-10 – 2017-12-11 (×2): 100 mg via ORAL
  Filled 2017-12-10 (×2): qty 1

## 2017-12-10 MED ORDER — SUGAMMADEX SODIUM 500 MG/5ML IV SOLN
INTRAVENOUS | Status: DC | PRN
  Administered 2017-12-10: 300 mg via INTRAVENOUS

## 2017-12-10 MED ORDER — LACTATED RINGERS IV SOLN
INTRAVENOUS | Status: DC
  Administered 2017-12-10 (×4): via INTRAVENOUS

## 2017-12-10 MED ORDER — DEXAMETHASONE SODIUM PHOSPHATE 10 MG/ML IJ SOLN
INTRAMUSCULAR | Status: DC | PRN
  Administered 2017-12-10: 10 mg via INTRAVENOUS

## 2017-12-10 MED ORDER — SODIUM CHLORIDE 0.9 % IV SOLN
INTRAVENOUS | Status: DC | PRN
  Administered 2017-12-10: 15 ug/min via INTRAVENOUS

## 2017-12-10 MED ORDER — IOPAMIDOL (ISOVUE-300) INJECTION 61%
INTRAVENOUS | Status: AC
  Filled 2017-12-10: qty 30

## 2017-12-10 MED ORDER — MIDAZOLAM HCL 2 MG/2ML IJ SOLN
INTRAMUSCULAR | Status: DC | PRN
  Administered 2017-12-10: 1 mg via INTRAVENOUS

## 2017-12-10 MED ORDER — MIDAZOLAM HCL 2 MG/2ML IJ SOLN
INTRAMUSCULAR | Status: AC
  Filled 2017-12-10: qty 2

## 2017-12-10 MED ORDER — PROPOFOL 10 MG/ML IV BOLUS
INTRAVENOUS | Status: DC | PRN
  Administered 2017-12-10: 100 mg via INTRAVENOUS

## 2017-12-10 MED ORDER — ONDANSETRON HCL 4 MG/2ML IJ SOLN: 4.0000 mg | Freq: Four times a day (QID) | INTRAMUSCULAR | Status: DC | PRN

## 2017-12-10 MED ORDER — ATORVASTATIN CALCIUM 10 MG PO TABS
10.0000 mg | ORAL_TABLET | Freq: Every day | ORAL | Status: DC
Start: 2017-12-11 — End: 2017-12-11

## 2017-12-10 MED ORDER — ROCURONIUM BROMIDE 10 MG/ML (PF) SYRINGE
PREFILLED_SYRINGE | INTRAVENOUS | Status: DC | PRN
  Administered 2017-12-10: 20 mg via INTRAVENOUS
  Administered 2017-12-10: 10 mg via INTRAVENOUS
  Administered 2017-12-10: 50 mg via INTRAVENOUS

## 2017-12-10 MED ORDER — IOPAMIDOL (ISOVUE-370) INJECTION 76%
100.0000 mL | Freq: Once | INTRAVENOUS | Status: AC | PRN
  Administered 2017-12-10: 75 mL via INTRAVENOUS

## 2017-12-10 MED ORDER — VALSARTAN-HYDROCHLOROTHIAZIDE 160-25 MG PO TABS
1.0000 | ORAL_TABLET | Freq: Every day | ORAL | Status: DC
Start: 2017-12-11 — End: 2017-12-11

## 2017-12-10 MED ORDER — PIPERACILLIN-TAZOBACTAM 3.375 G IVPB
3.3750 g | Freq: Once | INTRAVENOUS | Status: AC
  Administered 2017-12-10: 3.375 mg via INTRAVENOUS

## 2017-12-10 MED FILL — Piperacillin Sod-Tazobactam Sod in Dex IV Sol 3-0.375GM/50ML: INTRAVENOUS | Qty: 50 | Status: AC

## 2017-12-10 NOTE — Progress Notes (Signed)
Patient assessed this afternoon after successful MWA procedure.  States he has been recovering well.  Complains of abdominal pain during visit, but states it is "all over, like I have gas."   Procedure site intact, soft.  He has been given pain medication.  All questions answered.   Will continue to monitor.  Planning for possible discharge in the AM if does well overnight.   Brynda Greathouse, MS RD PA-C

## 2017-12-10 NOTE — H&P (Signed)
Chief Complaint: Patient was seen in consultation today for stage IV metastatic colon cancer  Referring Physician(s): Dr. Delton Coombes  Supervising Physician: Markus Daft  Patient Status: [facility] - Out-pt  History of Present Illness: Christopher Friedman is a 67 y.o. 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  After treatment with chemotherapy he has decreased in the size of his liver lesion and is now an appropriate candidate for ablation therapy.  Patient and wife met with Dr. Anselm Pancoast in consultation 11/26/17 to discuss ablation treatment and they have elected to move forward.  He presents for procedure today in his usual state of health. Denies any complaints or new symptoms.   He has been NPO.  He does not take blood thinners.   Past Medical History:  Diagnosis Date  . Colon cancer ([place])   . Diabetes ([place])   . GERD (gastroesophageal reflux disease)   . Hypertension     Past Surgical History:  Procedure Laterality Date  . APPENDECTOMY     during colon surgery  . COLON SURGERY    . IR RADIOLOGIST EVAL & MGMT  11/26/2017  . SHOULDER SURGERY  2003    Allergies: Patient has no known allergies.  Medications: Prior to Admission medications   Medication Sig Start Date End Date Taking? Authorizing Provider  Alpha-Lipoic Acid 200 MG CAPS Take 1 capsule by mouth daily.   Yes [provider]  Ascorbic Acid (VITAMIN C) 1000 MG tablet Take 1,000 mg by mouth daily.   Yes [provider]  ASHWAGANDHA PO Take 800 mg by mouth daily.   Yes [provider]  aspirin 81 MG tablet Take 81 mg by mouth daily.   Yes [provider]  atorvastatin (LIPITOR) 10 MG tablet Take 10 mg by mouth daily.  07/22/17  Yes [provider]  BEE POLLEN PO Take 1 capsule by mouth daily.    Yes [provider]  Blood Glucose Monitoring Suppl (ONE TOUCH ULTRA MINI) W/DEVICE KIT USE TO CHECK BLOOD  SUGAR DAILY. 01/05/15  Yes [provider]  cholecalciferol (VITAMIN D) 1000 units tablet Take 1,000 Units by mouth daily.   Yes [provider]  CINNAMON PO Take 1,000 mg by mouth daily.   Yes [provider]  Cod Liver Oil CAPS Take 1 capsule by mouth daily.   Yes [provider]  Coenzyme Q10 (CO Q 10) 100 MG CAPS Take 1 capsule by mouth daily.    Yes [provider]  Cyanocobalamin (VITAMIN B-12) 5000 MCG TBDP Take 5,000 mcg by mouth daily.   Yes [provider]  Flax Oil-Fish Oil-Borage Oil (FISH OIL-FLAX OIL-BORAGE OIL) CAPS Take 1 capsule by mouth daily.   Yes [provider]  Garlic 4827 MG CAPS Take 1 capsule by mouth daily.   Yes [provider]  glipiZIDE (GLUCOTROL XL) 10 MG 24 hr tablet Take 10 mg by mouth daily.  07/22/17  Yes [provider]  Glucosamine-Chondroit-Collagen ([employer] GLUCO-CHONDROIT PLUS UC-II PO) Take 1 tablet by mouth daily.    Yes [provider]  LECITHIN PO Take 1 capsule by mouth daily.   Yes [provider]  magnesium oxide (MAG-OX) 400 MG tablet Take 400 mg by mouth daily.    Yes [provider]  metFORMIN (GLUCOPHAGE) 500 MG tablet Take 1 tablet (500 mg total) by mouth 2 (two) times daily with a meal. Patient taking differently: Take 500 mg  by mouth daily with breakfast.  08/06/17  Yes Derek Jack, MD  milk thistle 175 MG tablet Take 175 mg by mouth daily.   Yes [provider]  OVER THE COUNTER MEDICATION Take 1 capsule by mouth daily. Mushroom Complex 750 mg   Yes [provider]  OVER THE COUNTER MEDICATION Take 1 capsule by mouth daily. Cura Med 750 mg   Yes [provider]  valsartan-hydrochlorothiazide (DIOVAN-HCT) 160-25 MG tablet Take 1 tablet by mouth daily. 05/05/17  Yes [provider]     Family History  Problem Relation Age of Onset  . Diabetes Mother   . Cancer Father   . Bone cancer Father   .  Colon cancer Neg Hx   . Colon polyps Neg Hx     Social History   Socioeconomic History  . Marital status: Married    Spouse name: Not on file  . Number of children: Not on file  . Years of education: Not on file  . Highest education level: Not on file  Occupational History  . Not on file  Social Needs  . Financial resource strain: Not on file  . Food insecurity:    Worry: Not on file    Inability: Not on file  . Transportation needs:    Medical: Not on file    Non-medical: Not on file  Tobacco Use  . Smoking status: Former Smoker    Last attempt to quit: 04/05/1995    Years since quitting: 22.6  . Smokeless tobacco: Never Used  Substance and Sexual Activity  . Alcohol use: Not Currently    Alcohol/week: 0.0 standard drinks    Comment: used to drink but stopped about 20 years ago  . Drug use: Never  . Sexual activity: Not on file  Lifestyle  . Physical activity:    Days per week: Not on file    Minutes per session: Not on file  . Stress: Not on file  Relationships  . Social connections:    Talks on phone: Not on file    Gets together: Not on file    Attends religious service: Not on file    Active member of club or organization: Not on file    Attends meetings of clubs or organizations: Not on file    Relationship status: Not on file  Other Topics Concern  . Not on file  Social History Narrative  . Not on file    Review of Systems: A 12 point ROS discussed and pertinent positives are indicated in the HPI above.  All other systems are negative.  Review of Systems  Constitutional: Negative for fatigue and fever.  Respiratory: Negative for cough and shortness of breath.   Cardiovascular: Negative for chest pain.  Gastrointestinal: Negative for abdominal pain, nausea and vomiting.  Musculoskeletal: Negative for back pain.  Psychiatric/Behavioral: Negative for behavioral problems and confusion.    Vital Signs: BP 137/84 (BP Location: Right Arm)   Pulse (!) 59    Temp 97.9 F (36.6 C) (Oral)   Resp 18   SpO2 100%   Physical Exam  Constitutional: He is oriented to person, place, and time. He appears well-developed.  Neck: Normal range of motion. Neck supple. No tracheal deviation present.  Cardiovascular: Normal rate, regular rhythm and normal heart sounds. Exam reveals no gallop and no friction rub.  No murmur heard. Pulmonary/Chest: Effort normal and breath sounds normal. No respiratory distress.  Abdominal: Soft. He exhibits no distension. There is no tenderness.  Neurological: He is alert and oriented to person, place, and time.  Skin: Skin is warm and dry.  Psychiatric: He has a normal mood and affect. His behavior is normal. Judgment and thought content normal.  Nursing note and vitals reviewed.   MD Evaluation Airway: WNL Heart: WNL Abdomen: WNL Chest/ Lungs: WNL ASA  Classification: 3 Mallampati/Airway Score: One   Imaging: Ct Chest W Contrast  Result Date: 11/11/2017 CLINICAL DATA:  Stage IV colon cancer with liver and lung metastasis. Status post colon resection and chemotherapy. EXAM: CT CHEST, ABDOMEN, AND PELVIS WITH CONTRAST TECHNIQUE: Multidetector CT imaging of the chest, abdomen and pelvis was performed following the standard protocol during bolus administration of intravenous contrast. CONTRAST:  131m ISOVUE-300 IOPAMIDOL (ISOVUE-300) INJECTION 61% COMPARISON:  08/18/2017 FINDINGS: CT CHEST FINDINGS Cardiovascular: Right Port-A-Cath which terminates at the superior caval/atrial junction. Aortic and branch vessel atherosclerosis. Tortuous thoracic aorta. Mild cardiomegaly, without pericardial effusion. Multivessel coronary artery atherosclerosis. No central pulmonary embolism, on this non-dedicated study. Mediastinum/Nodes: No supraclavicular adenopathy. No mediastinal or hilar adenopathy. Small hiatal hernia. Lungs/Pleura: No pleural fluid. 4 mm left apical pulmonary nodule is unchanged on 22/4. A subpleural 2 mm right upper  lobe pulmonary nodule is 68/4. Minimal nodularity along the right minor fissure is unchanged on 69/4. A left lower lobe 5 mm nodule on 106/4 is unchanged. Musculoskeletal: No acute osseous abnormality. Surgical or posttraumatic changes in the right scapula. CT ABDOMEN PELVIS FINDINGS Hepatobiliary: Heterogeneous hepatic steatosis. This limits evaluation for focal liver lesion. Segment 4 hypoattenuating lesion measures 2.4 x 2.5 cm today versus 3.8 x 2.8 cm on the prior. The lesion questioned in segments 5-8 on the prior exam is less well-defined today. Vague hypoattenuation in this area on 09/03 is favored to represent heterogeneous steatosis. Normal gallbladder, without biliary ductal dilatation. Pancreas: Normal, without mass or ductal dilatation. Spleen: Normal in size, without focal abnormality. Adrenals/Urinary Tract: Normal left adrenal gland. Right adrenal nodule of 9 mm is unchanged. Too small to characterize left renal lesions. Normal right kidney, without hydronephrosis or hydroureter. Normal urinary bladder. Stomach/Bowel: Normal distal stomach. Colonic stool burden suggests constipation. Scattered colonic diverticula. Normal terminal ileum. Surgical sutures at the sigmoid without locally recurrent disease. Normal small bowel caliber. A right-sided inguinal hernia contains nonobstructive small bowel, as before. Vascular/Lymphatic: Aortic and branch vessel atherosclerosis. No abdominopelvic adenopathy. Reproductive: Normal prostate. Other: No significant free fluid. No evidence of omental or peritoneal disease. Fat containing left inguinal hernia. Musculoskeletal: No acute osseous abnormality. Degenerate disc disease at L4-5. IMPRESSION: CT CHEST IMPRESSION 1. Similar bilateral pulmonary nodules. No new or progressive disease in the chest. 2. No thoracic adenopathy. 3. Coronary artery atherosclerosis. Aortic Atherosclerosis (ICD10-I70.0). CT ABDOMEN AND PELVIS IMPRESSION 1. Decrease size of segment 4 liver  lesion. Heterogeneous hepatic steatosis, making evaluation for new liver lesions difficult. 2. No extrahepatic metastatic disease identified within the abdomen or pelvis. 3. Similar nonspecific right adrenal nodule. 4. Bilateral inguinal hernias, with nonobstructive small bowel in the right-sided hernia. Electronically Signed   By: KAbigail MiyamotoM.D.   On: 11/11/2017 11:29   Ct Abdomen Pelvis W Contrast  Result Date: 11/11/2017 CLINICAL DATA:  Stage IV colon cancer with liver and lung metastasis. Status post colon resection and chemotherapy. EXAM: CT CHEST, ABDOMEN, AND PELVIS WITH CONTRAST TECHNIQUE: Multidetector CT imaging of the chest, abdomen and pelvis was performed following the standard protocol during bolus administration of intravenous contrast. CONTRAST:  1072mISOVUE-300 IOPAMIDOL (ISOVUE-300) INJECTION 61% COMPARISON:  08/18/2017 FINDINGS: CT CHEST  FINDINGS Cardiovascular: Right Port-A-Cath which terminates at the superior caval/atrial junction. Aortic and branch vessel atherosclerosis. Tortuous thoracic aorta. Mild cardiomegaly, without pericardial effusion. Multivessel coronary artery atherosclerosis. No central pulmonary embolism, on this non-dedicated study. Mediastinum/Nodes: No supraclavicular adenopathy. No mediastinal or hilar adenopathy. Small hiatal hernia. Lungs/Pleura: No pleural fluid. 4 mm left apical pulmonary nodule is unchanged on 22/4. A subpleural 2 mm right upper lobe pulmonary nodule is 68/4. Minimal nodularity along the right minor fissure is unchanged on 69/4. A left lower lobe 5 mm nodule on 106/4 is unchanged. Musculoskeletal: No acute osseous abnormality. Surgical or posttraumatic changes in the right scapula. CT ABDOMEN PELVIS FINDINGS Hepatobiliary: Heterogeneous hepatic steatosis. This limits evaluation for focal liver lesion. Segment 4 hypoattenuating lesion measures 2.4 x 2.5 cm today versus 3.8 x 2.8 cm on the prior. The lesion questioned in segments 5-8 on the prior  exam is less well-defined today. Vague hypoattenuation in this area on 09/03 is favored to represent heterogeneous steatosis. Normal gallbladder, without biliary ductal dilatation. Pancreas: Normal, without mass or ductal dilatation. Spleen: Normal in size, without focal abnormality. Adrenals/Urinary Tract: Normal left adrenal gland. Right adrenal nodule of 9 mm is unchanged. Too small to characterize left renal lesions. Normal right kidney, without hydronephrosis or hydroureter. Normal urinary bladder. Stomach/Bowel: Normal distal stomach. Colonic stool burden suggests constipation. Scattered colonic diverticula. Normal terminal ileum. Surgical sutures at the sigmoid without locally recurrent disease. Normal small bowel caliber. A right-sided inguinal hernia contains nonobstructive small bowel, as before. Vascular/Lymphatic: Aortic and branch vessel atherosclerosis. No abdominopelvic adenopathy. Reproductive: Normal prostate. Other: No significant free fluid. No evidence of omental or peritoneal disease. Fat containing left inguinal hernia. Musculoskeletal: No acute osseous abnormality. Degenerate disc disease at L4-5. IMPRESSION: CT CHEST IMPRESSION 1. Similar bilateral pulmonary nodules. No new or progressive disease in the chest. 2. No thoracic adenopathy. 3. Coronary artery atherosclerosis. Aortic Atherosclerosis (ICD10-I70.0). CT ABDOMEN AND PELVIS IMPRESSION 1. Decrease size of segment 4 liver lesion. Heterogeneous hepatic steatosis, making evaluation for new liver lesions difficult. 2. No extrahepatic metastatic disease identified within the abdomen or pelvis. 3. Similar nonspecific right adrenal nodule. 4. Bilateral inguinal hernias, with nonobstructive small bowel in the right-sided hernia. Electronically Signed   By: Abigail Miyamoto M.D.   On: 11/11/2017 11:29   Mr Liver W KK Contrast  Result Date: 11/21/2017 CLINICAL DATA:  Metastatic colon cancer.  Evaluate liver disease. EXAM: MRI ABDOMEN WITHOUT AND  WITH CONTRAST TECHNIQUE: Multiplanar multisequence MR imaging of the abdomen was performed both before and after the administration of intravenous contrast. CONTRAST:  98m MULTIHANCE GADOBENATE DIMEGLUMINE 529 MG/ML IV SOLN COMPARISON:  CT scan 11/11/2017, 08/18/2017. FINDINGS: Lower chest: No obvious pulmonary lesions are identified on the MRI but the patient has known lung nodules based on the prior chest CT. No pleural or pericardial effusion. Hepatobiliary: Geographic fatty infiltration of the liver with areas of fatty sparing. Again demonstrated is a hepatic metastatic lesion mainly in segment 4B but probably also involving segment 3 and probably extending up into segment 4A. The lesion measures approximately 26.5 x 21.5 mm. This is the somewhat necrotic appearing nonenhancing portion with some early central enhancement. Based on the inphase and out of phase T1 weighted gradient echo sequence there is fatty sparing around the lesion and not actual enhancement. I do not see any other definite hepatic metastatic lesions. The portal and hepatic veins are patent. No intrahepatic biliary dilatation. The gallbladder appears normal. No common bile duct dilatation. Pancreas:  No mass,  inflammation or ductal dilatation. Spleen:  Normal size.  No focal lesions. Adrenals/Urinary Tract: The adrenal glands and kidneys are unremarkable. Stomach/Bowel: Visualized portions within the abdomen are unremarkable. Vascular/Lymphatic: No pathologically enlarged lymph nodes identified. No abdominal aortic aneurysm demonstrated. Other:  No ascites or abdominal wall hernia. Musculoskeletal: No significant bony findings. IMPRESSION: 1. Left hepatic lobe metastatic lesion measuring approximately 26.5 x 21.5 mm with surrounding focal fatty sparing. 2. No other metastatic lesions are identified. 3. No upper abdominal lymphadenopathy. 4. Geographic fatty infiltration of the liver. Electronically Signed   By: Marijo Sanes M.D.   On:  11/21/2017 17:55   Ir Radiologist Eval & Mgmt  Result Date: 11/26/2017 Please refer to notes tab for details about interventional procedure. (Op Note)   Labs:  CBC: Recent Labs    10/28/17 1021 11/12/17 0959 12/04/17 0852 12/04/17 1337  WBC 5.8 4.8 7.1 7.8  HGB 11.8* 11.9* 12.4* 12.8*  HCT 36.2* 36.4* 38.8* 38.0*  PLT 215 188 157 169    COAGS: Recent Labs    12/04/17 1337  INR 0.94    BMP: Recent Labs    10/28/17 1021 11/12/17 0959 12/04/17 0852 12/04/17 1337  NA 137 137 135 140  K 4.2 4.1 4.1 4.3  CL 100 101 101 102  CO2 _0 GLUCOSE 143* 177* 184* 111*  BUN _1 CALCIUM 9.2 9.1 9.1 9.7  CREATININE 1.02 1.28* 1.14 1.13  GFRNONAA >60 57* >60 >60  GFRAA >60 >60 >60 >60    LIVER FUNCTION TESTS: Recent Labs    10/28/17 1021 11/12/17 0959 12/04/17 0852 12/04/17 1337  BILITOT 0.4 0.8 0.6 0.5  AST _2 ALT _3 ALKPHOS 74 74 69 70  PROT 7.0 7.0 7.1 7.6  ALBUMIN 4.0 4.1 4.2 4.6    TUMOR MARKERS: No results for input(s): AFPTM, CEA, CA199, CHROMGRNA in the last 8760 hours.  Assessment and Plan: Stage IV metastatic colon cancer Patient with past medical history of metastatic colon cancer to the lung and liver presents for ablation therapy of his liver lesion.  Case reviewed by Dr. Anselm Pancoast who approves patient for procedure and has met with the patient and his wife in consultation.  Patient presents today in their usual state of health.  He has been NPO and is not currently on blood thinners.   Risks and benefits discussed with the patient including, but not limited to failure to treat entire lesion, bleeding, infection, damage to adjacent structures, decrease in renal function or post procedural neuropathy.  All of the patient's questions were answered, patient is agreeable to proceed. Consent signed and in chart.   Thank you for this interesting consult.  I greatly enjoyed meeting Wylee Dorantes and look forward to  participating in their care.  A copy of this report was sent to the requesting provider on this date.  Electronically Signed: Docia Barrier, PA 12/10/2017, 8:25 AM   I spent a total of    15 Minutes in face to face in clinical consultation, greater than 50% of which was counseling/coordinating care for stage IV metastatic colon cancer.

## 2017-12-10 NOTE — Transfer of Care (Signed)
Immediate Anesthesia Transfer of Care Note  Patient: Christopher Friedman  Procedure(s) Performed: CT MICROWAVE THERMAL ABLATION (LIVER) (N/A )  Patient Location: PACU  Anesthesia Type:General  Level of Consciousness: sedated  Airway & Oxygen Therapy: Patient Spontanous Breathing and Patient connected to face mask oxygen  Post-op Assessment: Report given to RN and Post -op Vital signs reviewed and stable  Post vital signs: Reviewed and stable  Last Vitals:  Vitals Value Taken Time  BP 138/92 12/10/2017 12:03 PM  Temp    Pulse 68 12/10/2017 12:09 PM  Resp 8 12/10/2017 12:09 PM  SpO2 100 % 12/10/2017 12:09 PM  Vitals shown include unvalidated device data.  Last Pain:  Vitals:   12/10/17 0740  PainSc: 0-No pain         Complications: No apparent anesthesia complications

## 2017-12-10 NOTE — Anesthesia Postprocedure Evaluation (Signed)
Anesthesia Post Note  Patient: Christopher Friedman  Procedure(s) Performed: CT MICROWAVE THERMAL ABLATION (LIVER) (N/A )     Patient location during evaluation: PACU Anesthesia Type: General Level of consciousness: awake and alert Pain management: pain level controlled Vital Signs Assessment: post-procedure vital signs reviewed and stable Respiratory status: spontaneous breathing, nonlabored ventilation, respiratory function stable and patient connected to nasal cannula oxygen Cardiovascular status: blood pressure returned to baseline and stable Postop Assessment: no apparent nausea or vomiting Anesthetic complications: no    Last Vitals:  Vitals:   12/10/17 1230 12/10/17 1253  BP: (!) 138/91 139/90  Pulse: 66 63  Resp: 17 16  Temp: (!) 36.3 C 36.5 C  SpO2: 95% 97%    Last Pain:  Vitals:   12/10/17 1315  TempSrc:   PainSc: 8                  Tonyia Marschall L Kathleen Tamm

## 2017-12-10 NOTE — Procedures (Signed)
  Pre-operative Diagnosis: Metastatic colon cancer in liver       Post-operative Diagnosis: Metastatic colon cancer in liver   Indications: Solitary lesion in left hepatic lobe after chemotherapy and lesion is amendable to ablation  Procedure: Image guided microwave ablation with CT and US  Findings: Poorly visualized lesion in left hepatic lobe with evidence for surrounding fatty change and sparing.  Two PR Neuwave probes placed within the lesion and treated for 10 minutes.  Probes removed without complication.    Complications: No immediate     EBL: Minimal   Plan: PACU for recovery and then overnight observation.

## 2017-12-10 NOTE — Anesthesia Procedure Notes (Signed)
Procedure Name: Intubation Date/Time: 12/10/2017 8:59 AM Performed by: Cynda Familia, CRNA Pre-anesthesia Checklist: Patient identified, Emergency Drugs available, Suction available and Patient being monitored Patient Re-evaluated:Patient Re-evaluated prior to induction Oxygen Delivery Method: Circle System Utilized Preoxygenation: Pre-oxygenation with 100% oxygen Induction Type: IV induction Ventilation: Mask ventilation without difficulty Laryngoscope Size: Miller and 2 Grade View: Grade I Tube type: Oral Tube size: 7.5 mm Number of attempts: 1 Airway Equipment and Method: Stylet Placement Confirmation: ETT inserted through vocal cords under direct vision,  positive ETCO2 and breath sounds checked- equal and bilateral Secured at: 21 cm Tube secured with: Tape Dental Injury: Teeth and Oropharynx as per pre-operative assessment  Comments: Smooth IV induction by [place]  ---   intubation AM CRNA --- atraumatic-- teeth and mouth as preop-- many missing teeth and loose teeth--- unchanged after laryngoscopy-- bilat BS

## 2017-12-10 NOTE — Anesthesia Procedure Notes (Signed)
Date/Time: 12/10/2017 11:56 AM Performed by: Cynda Familia, CRNA Oxygen Delivery Method: Simple face mask Placement Confirmation: positive ETCO2 and breath sounds checked- equal and bilateral Dental Injury: Teeth and Oropharynx as per pre-operative assessment

## 2017-12-10 NOTE — H&P (Deleted)
  The note originally documented on this encounter has been moved the the encounter in which it belongs.  

## 2017-12-11 ENCOUNTER — Other Ambulatory Visit: Payer: Self-pay | Admitting: Student

## 2017-12-11 DIAGNOSIS — C189 Malignant neoplasm of colon, unspecified: Secondary | ICD-10-CM

## 2017-12-11 DIAGNOSIS — Z9221 Personal history of antineoplastic chemotherapy: Secondary | ICD-10-CM | POA: Diagnosis not present

## 2017-12-11 DIAGNOSIS — Z7984 Long term (current) use of oral hypoglycemic drugs: Secondary | ICD-10-CM | POA: Diagnosis not present

## 2017-12-11 DIAGNOSIS — C7802 Secondary malignant neoplasm of left lung: Secondary | ICD-10-CM | POA: Diagnosis not present

## 2017-12-11 DIAGNOSIS — E119 Type 2 diabetes mellitus without complications: Secondary | ICD-10-CM | POA: Diagnosis not present

## 2017-12-11 DIAGNOSIS — C787 Secondary malignant neoplasm of liver and intrahepatic bile duct: Secondary | ICD-10-CM | POA: Diagnosis not present

## 2017-12-11 DIAGNOSIS — K219 Gastro-esophageal reflux disease without esophagitis: Secondary | ICD-10-CM | POA: Diagnosis not present

## 2017-12-11 DIAGNOSIS — C779 Secondary and unspecified malignant neoplasm of lymph node, unspecified: Secondary | ICD-10-CM | POA: Diagnosis not present

## 2017-12-11 DIAGNOSIS — I10 Essential (primary) hypertension: Secondary | ICD-10-CM | POA: Diagnosis not present

## 2017-12-11 LAB — GLUCOSE, CAPILLARY: Glucose-Capillary: 153 mg/dL — ABNORMAL HIGH (ref 70–99)

## 2017-12-11 MED ORDER — FAMOTIDINE 20 MG PO TABS
10.0000 mg | ORAL_TABLET | Freq: Once | ORAL | Status: AC
  Administered 2017-12-11: 10 mg via ORAL
  Filled 2017-12-11: qty 1

## 2017-12-11 NOTE — Progress Notes (Signed)
Inpatient Diabetes Program Recommendations  AACE/ADA: New Consensus Statement on Inpatient Glycemic Control (2015)  Target Ranges:  Prepandial:   less than 140 mg/dL      Peak postprandial:   less than 180 mg/dL (1-2 hours)      Critically ill patients:  140 - 180 mg/dL   Lab Results  Component Value Date   GLUCAP 236 (H) 12/10/2017   HGBA1C 7.3 (H) 12/04/2017   Review of Glycemic Control  Diabetes history: DM 2 Outpatient Diabetes medications: Glipizide 10 mg Daily, Metformin 500 mg Daily Current orders for Inpatient glycemic control: None  Inpatient Diabetes Program Recommendations:    Patient has a history of DM. Glucose trends in the 200's. If patient is not discharged, consider CBGs and Novolog 0-9 units tid Correction scale.  Thanks,  Tama Headings RN, MSN, BC-ADM Inpatient Diabetes Coordinator Team Pager [phone] (8a-5p)

## 2017-12-11 NOTE — Progress Notes (Signed)
Pt discharged home with spouse in stable condition. Discharge instructions given. Pt and spouse verbalized understanding No immediate questions or concerns. No change made to medication. Pt discharged from unit via wheelchair.

## 2017-12-11 NOTE — Discharge Summary (Signed)
Patient ID: Christopher Friedman MRN: [id number] DOB/AGE: 12/13/1950 67 y.o.  Admit date: 12/10/2017 Discharge date: 12/11/2017  Supervising Physician: Sandi Mariscal  Patient Status: [facility] - In-pt  Admission Diagnoses: Metastatic colon cancer to liver  Discharge Diagnoses:  Active Problems:   Metastatic colon cancer to liver [facility])   Discharged Condition: stable  Hospital Course:  Patient presented to [facility] 12/10/2017 for an image-guided left hepatic metastasis microwave ablation with Dr. Anselm Pancoast. Procedure occurred without major complications and patient was transferred to floor for overnight observation. No major events occurred overnight.  Patient awake and alert sitting in bed. Accompanied by daughter at bedside. Complains of mild abdominal tenderness at incision sites. States pain is "no more than expected". Prescription of Percocet given for PRN pain. Plan to discharge home and follow-up in clinic in 2-4 weeks.   Significant Diagnostic Studies: Mr Liver W Wo Contrast  Result Date: 11/21/2017 CLINICAL DATA:  Metastatic colon cancer.  Evaluate liver disease. EXAM: MRI ABDOMEN WITHOUT AND WITH CONTRAST TECHNIQUE: Multiplanar multisequence MR imaging of the abdomen was performed both before and after the administration of intravenous contrast. CONTRAST:  88m MULTIHANCE GADOBENATE DIMEGLUMINE 529 MG/ML IV SOLN COMPARISON:  CT scan 11/11/2017, 08/18/2017. FINDINGS: Lower chest: No obvious pulmonary lesions are identified on the MRI but the patient has known lung nodules based on the prior chest CT. No pleural or pericardial effusion. Hepatobiliary: Geographic fatty infiltration of the liver with areas of fatty sparing. Again demonstrated is a hepatic metastatic lesion mainly in segment 4B but probably also involving segment 3 and probably extending up into segment 4A. The lesion measures approximately 26.5 x 21.5 mm. This is the somewhat necrotic appearing nonenhancing portion with some early central  enhancement. Based on the inphase and out of phase T1 weighted gradient echo sequence there is fatty sparing around the lesion and not actual enhancement. I do not see any other definite hepatic metastatic lesions. The portal and hepatic veins are patent. No intrahepatic biliary dilatation. The gallbladder appears normal. No common bile duct dilatation. Pancreas:  No mass, inflammation or ductal dilatation. Spleen:  Normal size.  No focal lesions. Adrenals/Urinary Tract: The adrenal glands and kidneys are unremarkable. Stomach/Bowel: Visualized portions within the abdomen are unremarkable. Vascular/Lymphatic: No pathologically enlarged lymph nodes identified. No abdominal aortic aneurysm demonstrated. Other:  No ascites or abdominal wall hernia. Musculoskeletal: No significant bony findings. IMPRESSION: 1. Left hepatic lobe metastatic lesion measuring approximately 26.5 x 21.5 mm with surrounding focal fatty sparing. 2. No other metastatic lesions are identified. 3. No upper abdominal lymphadenopathy. 4. Geographic fatty infiltration of the liver. Electronically Signed   By: PMarijo SanesM.D.   On: 11/21/2017 17:55   Ct Guide Tissue Ablation  Result Date: 12/10/2017 INDICATION: 67 year old with metastatic colon cancer to the liver. Patient has a metastatic lesion in left hepatic lobe which has decreased in size with chemotherapy and now it is amendable for image guided thermal ablation. EXAM: CT-GUIDED MICROWAVE ABLATION OF LEFT HEPATIC LESION COMPARISON:  MRI 11/21/2017 MEDICATIONS: Zosyn 3.75 g; The antibiotic was administered in an appropriate time interval prior to needle puncture of the skin. ANESTHESIA/SEDATION: General - as administered by the Anesthesia department COMPLICATIONS: None immediate. CONTRAST:  150 mL Isovue 370 TECHNIQUE: Informed written consent was obtained from the patient after a thorough discussion of the procedural risks, benefits and alternatives. All questions were addressed. Maximal  Sterile Barrier Technique was utilized including caps, mask, sterile gowns, sterile gloves, sterile drape, hand hygiene and skin antiseptic. A timeout  was performed prior to the initiation of the procedure. Patient was placed supine on the CT scanner. Liver was evaluated with ultrasound and CT images through the abdomen were obtained. Post contrast CT images were also obtained. The lesion in left hepatic lobe was very difficult to visualize with both imaging modalities due to the position of the overlying ribs. Anterior abdomen was prepped and draped in sterile fashion. [place] needles were directed towards the lesion with ultrasound guidance and position was confirmed with CT. The [place] needles were removed. Neuwave PR probe was directed into the liver from a right anterior intercostal approach. The first probe was placed along the caudal aspect of the lesion. A second PR probe was advanced from an anterior right intercostal approach with combination of ultrasound and CT guidance. Second probe was positioned along the cephalad aspect of the lesion. Needles advanced beyond the lesion using CT guidance. After suitable probe positioning, the microwave ablation was performed for 10 minutes. Both probes were removed using the cautery function. Follow up CT images were obtained with IV contrast. Bandage placed over the puncture site. FINDINGS: Lesion in left hepatic lobe was very difficult to visualize due to overlying bones and ribs in the anterior abdomen. In addition, the tissue around the lesion is hypoechoic on ultrasound, probably related to areas of fatty sparing. Lesion was difficult to visualize even on a post contrast CT images. Probes were able to be placed with CT guidance using small calcifications within the lesion as the target. The post ablation images demonstrate expected changes at the ablation site. No significant perihepatic fluid or hematoma formation. IMPRESSION: Successful CT-guided  microwave ablation of the left hepatic lesion. Patient will be admitted overnight for observation. Electronically Signed   By: Markus Daft M.D.   On: 12/10/2017 12:45   Ir Radiologist Eval & Mgmt  Result Date: 11/26/2017 Please refer to notes tab for details about interventional procedure. (Op Note)   Discharge Exam: Blood pressure 119/72, pulse 71, temperature 98.5 F (36.9 C), temperature source Oral, resp. rate 16, height 6' (1.829 m), weight 218 lb 11.1 oz (99.2 kg), SpO2 98 %. Physical Exam  Constitutional: He is oriented to person, place, and time. He appears well-developed and well-nourished. No distress.  Cardiovascular: Normal rate, regular rhythm and normal heart sounds.  No murmur heard. Pulmonary/Chest: Effort normal and breath sounds normal. No respiratory distress. He has no wheezes.  Abdominal:  Procedure site soft with mild tenderness, no active bleeding or hematoma.  Neurological: He is alert and oriented to person, place, and time.  Skin: Skin is warm and dry.  Psychiatric: He has a normal mood and affect. His behavior is normal. Judgment and thought content normal.  Nursing note and vitals reviewed.   Disposition: Discharge disposition: 01-Home or Self Care       Discharge Instructions    Call MD for:  difficulty breathing, headache or visual disturbances   Complete by:  As directed    Call MD for:  extreme fatigue   Complete by:  As directed    Call MD for:  hives   Complete by:  As directed    Call MD for:  persistant dizziness or light-headedness   Complete by:  As directed    Call MD for:  persistant nausea and vomiting   Complete by:  As directed    Call MD for:  redness, tenderness, or signs of infection (pain, swelling, redness, odor or green/yellow discharge around incision site)  Complete by:  As directed    Call MD for:  severe uncontrolled pain   Complete by:  As directed    Call MD for:  temperature >100.4   Complete by:  As directed     Diet - low sodium heart healthy   Complete by:  As directed    Increase activity slowly   Complete by:  As directed      Allergies as of 12/11/2017   No Known Allergies     Medication List    TAKE these medications   Alpha-Lipoic Acid 200 MG Caps Take 1 capsule by mouth daily.   ASHWAGANDHA PO Take 800 mg by mouth daily.   aspirin 81 MG tablet Take 81 mg by mouth daily.   atorvastatin 10 MG tablet Commonly known as:  LIPITOR Take 10 mg by mouth daily.   BEE POLLEN PO Take 1 capsule by mouth daily.   cholecalciferol 1000 units tablet Commonly known as:  VITAMIN D Take 1,000 Units by mouth daily.   CINNAMON PO Take 1,000 mg by mouth daily.   Co Q 10 100 MG Caps Take 1 capsule by mouth daily.   Cod Liver Oil Caps Take 1 capsule by mouth daily.   [employer]S GLUCO-CHONDROIT PLUS UC-II PO Take 1 tablet by mouth daily.   Fish Oil-Flax Oil-Borage Oil Caps Take 1 capsule by mouth daily.   Garlic 0258 MG Caps Take 1 capsule by mouth daily.   glipiZIDE 10 MG 24 hr tablet Commonly known as:  GLUCOTROL XL Take 10 mg by mouth daily.   LECITHIN PO Take 1 capsule by mouth daily.   magnesium oxide 400 MG tablet Commonly known as:  MAG-OX Take 400 mg by mouth daily.   metFORMIN 500 MG tablet Commonly known as:  GLUCOPHAGE Take 1 tablet (500 mg total) by mouth 2 (two) times daily with a meal. What changed:  when to take this   milk thistle 175 MG tablet Take 175 mg by mouth daily.   ONE TOUCH ULTRA MINI w/Device Kit USE TO CHECK BLOOD SUGAR DAILY.   OVER THE COUNTER MEDICATION Take 1 capsule by mouth daily. Mushroom Complex 750 mg   OVER THE COUNTER MEDICATION Take 1 capsule by mouth daily. Cura Med 750 mg   valsartan-hydrochlorothiazide 160-25 MG tablet Commonly known as:  DIOVAN-HCT Take 1 tablet by mouth daily.   Vitamin B-12 5000 MCG Tbdp Take 5,000 mcg by mouth daily.   vitamin C 1000 MG tablet Take 1,000 mg by mouth daily.         Electronically  Signed: Earley Abide, PA-C 12/11/2017, 12:10 PM   I have spent Less Than 30 Minutes discharging Charmayne Sheer.

## 2017-12-12 LAB — GLUCOSE, CAPILLARY: GLUCOSE-CAPILLARY: 212 mg/dL — AB (ref 70–99)

## 2018-01-01 ENCOUNTER — Encounter (HOSPITAL_COMMUNITY): Payer: Self-pay | Admitting: Hematology

## 2018-01-01 ENCOUNTER — Inpatient Hospital Stay (HOSPITAL_COMMUNITY): Payer: Medicare PPO | Attending: Hematology | Admitting: Hematology

## 2018-01-01 ENCOUNTER — Other Ambulatory Visit: Payer: Self-pay

## 2018-01-01 ENCOUNTER — Inpatient Hospital Stay (HOSPITAL_COMMUNITY): Payer: Medicare PPO

## 2018-01-01 VITALS — BP 124/75 | HR 72 | Temp 98.2°F | Resp 18 | Wt 216.0 lb

## 2018-01-01 DIAGNOSIS — Z87891 Personal history of nicotine dependence: Secondary | ICD-10-CM | POA: Insufficient documentation

## 2018-01-01 DIAGNOSIS — Z7984 Long term (current) use of oral hypoglycemic drugs: Secondary | ICD-10-CM

## 2018-01-01 DIAGNOSIS — I10 Essential (primary) hypertension: Secondary | ICD-10-CM

## 2018-01-01 DIAGNOSIS — C7800 Secondary malignant neoplasm of unspecified lung: Secondary | ICD-10-CM | POA: Diagnosis not present

## 2018-01-01 DIAGNOSIS — C189 Malignant neoplasm of colon, unspecified: Secondary | ICD-10-CM | POA: Insufficient documentation

## 2018-01-01 DIAGNOSIS — E1142 Type 2 diabetes mellitus with diabetic polyneuropathy: Secondary | ICD-10-CM | POA: Diagnosis not present

## 2018-01-01 DIAGNOSIS — C787 Secondary malignant neoplasm of liver and intrahepatic bile duct: Secondary | ICD-10-CM | POA: Diagnosis not present

## 2018-01-01 LAB — COMPREHENSIVE METABOLIC PANEL
ALT: 28 U/L (ref 0–44)
AST: 28 U/L (ref 15–41)
Albumin: 4.2 g/dL (ref 3.5–5.0)
Alkaline Phosphatase: 100 U/L (ref 38–126)
Anion gap: 7 (ref 5–15)
BILIRUBIN TOTAL: 0.6 mg/dL (ref 0.3–1.2)
BUN: 14 mg/dL (ref 8–23)
CHLORIDE: 101 mmol/L (ref 98–111)
CO2: 26 mmol/L (ref 22–32)
Calcium: 9.2 mg/dL (ref 8.9–10.3)
Creatinine, Ser: 1.1 mg/dL (ref 0.61–1.24)
Glucose, Bld: 137 mg/dL — ABNORMAL HIGH (ref 70–99)
POTASSIUM: 4.1 mmol/L (ref 3.5–5.1)
Sodium: 134 mmol/L — ABNORMAL LOW (ref 135–145)
TOTAL PROTEIN: 7.6 g/dL (ref 6.5–8.1)

## 2018-01-01 LAB — CBC WITH DIFFERENTIAL/PLATELET
Basophils Absolute: 0 10*3/uL (ref 0.0–0.1)
Basophils Relative: 0 %
EOS PCT: 1 %
Eosinophils Absolute: 0.1 10*3/uL (ref 0.0–0.7)
HCT: 38.4 % — ABNORMAL LOW (ref 39.0–52.0)
Hemoglobin: 12.6 g/dL — ABNORMAL LOW (ref 13.0–17.0)
LYMPHS ABS: 2 10*3/uL (ref 0.7–4.0)
LYMPHS PCT: 20 %
MCH: 30.7 pg (ref 26.0–34.0)
MCHC: 32.8 g/dL (ref 30.0–36.0)
MCV: 93.4 fL (ref 78.0–100.0)
MONO ABS: 0.6 10*3/uL (ref 0.1–1.0)
MONOS PCT: 6 %
Neutro Abs: 7.3 10*3/uL (ref 1.7–7.7)
Neutrophils Relative %: 73 %
PLATELETS: 234 10*3/uL (ref 150–400)
RBC: 4.11 MIL/uL — ABNORMAL LOW (ref 4.22–5.81)
RDW: 14.1 % (ref 11.5–15.5)
WBC: 9.9 10*3/uL (ref 4.0–10.5)

## 2018-01-01 NOTE — Progress Notes (Signed)
[name], [name] [id number]   CLINIC:  Medical Oncology/Hematology  PCP:  [name], MD [place] [address] [phone]   REASON FOR VISIT:  Follow-up for metastatic colon cancer to the liver  CURRENT THERAPY: S/P microwave ablation to the liver mass  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 ([place])   05/27/2017 -  Chemotherapy    The patient had palonosetron (ALOXI) injection 0.25 mg, 0.25 mg, Intravenous,  Once, 12 of 12 cycles Administration: 0.25 mg (09/03/2017), 0.25 mg (10/01/2017), 0.25 mg (10/15/2017), 0.25 mg (10/29/2017), 0.25 mg (09/17/2017) irinotecan (CAMPTOSAR) 400 mg in dextrose 5 % 500 mL chemo infusion, 180 mg/m2, Intravenous,  Once, 12 of 12 cycles Dose modification: 144 mg/m2 (Cycle 4, Reason: Provider Judgment) Administration: 320 mg (09/03/2017), 320 mg (10/01/2017), 320 mg (10/15/2017), 320 mg (10/29/2017), 320 mg (09/17/2017) leucovorin 888 mg in dextrose 5 % 250 mL infusion, 400 mg/m2, Intravenous,  Once, 12 of 12 cycles Administration: 900 mg (09/03/2017), 900 mg (10/01/2017), 900 mg (10/15/2017), 900 mg (10/29/2017), 900 mg (09/17/2017) fluorouracil (ADRUCIL) chemo injection 900 mg, 400 mg/m2, Intravenous,  Once, 12 of 12 cycles Dose modification: 320 mg/m2 (Cycle 4, Reason: Provider Judgment) Administration: 700 mg (09/03/2017), 700 mg (10/01/2017), 700 mg (10/15/2017), 700 mg (10/29/2017), 700 mg (09/17/2017) fluorouracil (ADRUCIL) 5,350 mg in sodium chloride 0.9 % 143 mL chemo infusion, 2,400 mg/m2, Intravenous, 1 Day/Dose, 12 of 12 cycles Dose modification: 1,920 mg/m2 (Cycle 4, Reason: Provider Judgment) Administration: 4,250 mg (09/03/2017), 4,250 mg (10/01/2017), 4,250 mg (10/15/2017), 4,250 mg (10/29/2017), 4,250 mg (09/17/2017)  for chemotherapy treatment.     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 [facility])      CANCER STAGING: Cancer Staging Adenocarcinoma of  colon metastatic to liver [facility]) Staging form: Colon and Rectum, AJCC 8th Edition - Clinical stage from 07/13/2017: Stage IVB (pM1b) - Signed by Derek Jack, MD on 07/13/2017    INTERVAL HISTORY:  [name] 67 y.o. male returns for routine follow-up for metastatic colon cancer to the liver. He is doing well since his ablation. He still has some soreness to the site. Overall he is recovering well. Patient has no other complaint and has been feeling good with more energy. He reports his appetite at 100% and he is maintaining his weight at this time. His energy level is 75%. Patient denies any new pains. Denies any nausea, vomiting, or diarrhea. Denies any change in bowel movement. Denies any bleeding or blood in his stool.    REVIEW OF SYSTEMS:  Review of Systems  All other systems reviewed and are negative.    PAST MEDICAL/SURGICAL HISTORY:  Past Medical History:  Diagnosis Date  . Colon cancer ([place])   . Diabetes ([place])   . GERD (gastroesophageal reflux disease)   . Hypertension    Past Surgical History:  Procedure Laterality Date  . APPENDECTOMY     during colon surgery  . COLON SURGERY    . IR RADIOLOGIST EVAL & MGMT  11/26/2017  . SHOULDER SURGERY  2003     SOCIAL HISTORY:  Social History   Socioeconomic History  . Marital status: Married    Spouse name: Not on file  . Number of children: Not on file  . Years of education: Not on file  . Highest education level: Not on file  Occupational History  . Not on file  Social Needs  . Financial  resource strain: Not on file  . Food insecurity:    Worry: Not on file    Inability: Not on file  . Transportation needs:    Medical: Not on file    Non-medical: Not on file  Tobacco Use  . Smoking status: Former Smoker    Last attempt to quit: 04/05/1995    Years since quitting: 22.7  . Smokeless tobacco: Never Used  Substance and Sexual Activity  . Alcohol use: Not Currently    Alcohol/week: 0.0 standard drinks     Comment: used to drink but stopped about 20 years ago  . Drug use: Never  . Sexual activity: Not on file  Lifestyle  . Physical activity:    Days per week: Not on file    Minutes per session: Not on file  . Stress: Not on file  Relationships  . Social connections:    Talks on phone: Not on file    Gets together: Not on file    Attends religious service: Not on file    Active member of club or organization: Not on file    Attends meetings of clubs or organizations: Not on file    Relationship status: Not on file  . Intimate partner violence:    Fear of current or ex partner: Not on file    Emotionally abused: Not on file    Physically abused: Not on file    Forced sexual activity: Not on file  Other Topics Concern  . Not on file  Social History Narrative  . Not on file    FAMILY HISTORY:  Family History  Problem Relation Age of Onset  . Diabetes Mother   . Cancer Father   . Bone cancer Father   . Colon cancer Neg Hx   . Colon polyps Neg Hx     CURRENT MEDICATIONS:  Outpatient Encounter Medications as of 01/01/2018  Medication Sig  . Alpha-Lipoic Acid 200 MG CAPS Take 1 capsule by mouth daily.  . Ascorbic Acid (VITAMIN C) 1000 MG tablet Take 1,000 mg by mouth daily.  . ASHWAGANDHA PO Take 800 mg by mouth daily.  Marland Kitchen aspirin 81 MG tablet Take 81 mg by mouth daily.  Marland Kitchen atorvastatin (LIPITOR) 10 MG tablet Take 10 mg by mouth daily.   Marland Kitchen BEE POLLEN PO Take 1 capsule by mouth daily.   . Blood Glucose Monitoring Suppl (ONE TOUCH ULTRA MINI) W/DEVICE KIT USE TO CHECK BLOOD SUGAR DAILY.  . cholecalciferol (VITAMIN D) 1000 units tablet Take 1,000 Units by mouth daily.  Marland Kitchen CINNAMON PO Take 1,000 mg by mouth daily.  Marland Kitchen Cod Liver Oil CAPS Take 1 capsule by mouth daily.  . Coenzyme Q10 (CO Q 10) 100 MG CAPS Take 1 capsule by mouth daily.   . Cyanocobalamin (VITAMIN B-12) 5000 MCG TBDP Take 5,000 mcg by mouth daily.  . Flax Oil-Fish Oil-Borage Oil (FISH OIL-FLAX OIL-BORAGE OIL) CAPS Take  1 capsule by mouth daily.  . Garlic 5009 MG CAPS Take 1 capsule by mouth daily.  Marland Kitchen glipiZIDE (GLUCOTROL XL) 10 MG 24 hr tablet Take 10 mg by mouth daily.   . Glucosamine-Chondroit-Collagen (CVS GLUCO-CHONDROIT PLUS UC-II PO) Take 1 tablet by mouth daily.   Marland Kitchen LECITHIN PO Take 1 capsule by mouth daily.  . magnesium oxide (MAG-OX) 400 MG tablet Take 400 mg by mouth daily.   . metFORMIN (GLUCOPHAGE) 500 MG tablet Take 1 tablet (500 mg total) by mouth 2 (two) times daily with a meal. (Patient taking  differently: Take 500 mg by mouth daily with breakfast. )  . milk thistle 175 MG tablet Take 175 mg by mouth daily.  Marland Kitchen OVER THE COUNTER MEDICATION Take 1 capsule by mouth daily. Mushroom Complex 750 mg  . OVER THE COUNTER MEDICATION Take 1 capsule by mouth daily. Cura Med 750 mg  . oxyCODONE-acetaminophen (PERCOCET/ROXICET) 5-325 MG tablet Take 1 tablet by mouth every 6 (six) hours as needed. for pain  . valsartan-hydrochlorothiazide (DIOVAN-HCT) 160-25 MG tablet Take 1 tablet by mouth daily.   No facility-administered encounter medications on file as of 01/01/2018.     ALLERGIES:  No Known Allergies   PHYSICAL EXAM:  ECOG Performance status: 1  Vitals:   01/01/18 1612  BP: 124/75  Pulse: 72  Resp: 18  Temp: 98.2 F (36.8 C)  SpO2: 98%   Filed Weights   01/01/18 1612  Weight: 216 lb (98 kg)    Physical Exam  Constitutional: He is oriented to person, place, and time. He appears well-developed and well-nourished.  Cardiovascular: Normal rate, regular rhythm and normal heart sounds.  Pulmonary/Chest: Effort normal and breath sounds normal.  Abdominal: Soft.  Musculoskeletal: Normal range of motion.  Neurological: He is alert and oriented to person, place, and time.  Skin: Skin is warm and dry.  Psychiatric: He has a normal mood and affect. His behavior is normal. Judgment and thought content normal.  Abdomen: Soft nontender with no palpable abnormality.   LABORATORY DATA:  I have  reviewed the labs as listed.  CBC    Component Value Date/Time   WBC 9.9 01/01/2018 1502   RBC 4.11 (L) 01/01/2018 1502   HGB 12.6 (L) 01/01/2018 1502   HCT 38.4 (L) 01/01/2018 1502   PLT 234 01/01/2018 1502   MCV 93.4 01/01/2018 1502   MCH 30.7 01/01/2018 1502   MCHC 32.8 01/01/2018 1502   RDW 14.1 01/01/2018 1502   LYMPHSABS 2.0 01/01/2018 1502   MONOABS 0.6 01/01/2018 1502   EOSABS 0.1 01/01/2018 1502   BASOSABS 0.0 01/01/2018 1502   CMP Latest Ref Rng & Units 01/01/2018 12/04/2017 12/04/2017  Glucose 70 - 99 mg/dL 137(H) 111(H) 184(H)  BUN 8 - 23 mg/dL _0 Creatinine 0.61 - 1.24 mg/dL 1.10 1.13 1.14  Sodium 135 - 145 mmol/L 134(L) 140 135  Potassium 3.5 - 5.1 mmol/L 4.1 4.3 4.1  Chloride 98 - 111 mmol/L 101 102 101  CO2 22 - 32 mmol/L _1 Calcium 8.9 - 10.3 mg/dL 9.2 9.7 9.1  Total Protein 6.5 - 8.1 g/dL 7.6 7.6 7.1  Total Bilirubin 0.3 - 1.2 mg/dL 0.6 0.5 0.6  Alkaline Phos 38 - 126 U/L 100 70 69  AST 15 - 41 U/L _2 ALT 0 - 44 U/L _3 DIAGNOSTIC IMAGING:  I have reviewed his MRI from 11/21/2017 images independently.    ASSESSMENT & PLAN:   Adenocarcinoma of colon metastatic to liver ([place]) 1.  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  He tolerated it very well.  We discussed the results of the CT scan CAP which showed a stable left upper lobe  subcentimeter lung nodule.  Segment 4 lesion in the liver has decreased to 2.4 cm.  No other clearly evident liver lesions present.  We discussed the option of giving him a break from chemotherapy versus putting him on maintenance 5-FU/Xeloda.  He opted for chemotherapy holiday. -He was evaluated by  Dr. Anselm Pancoast, who thought microwave thermal ablation would be the best procedure for him.  He would like to save  Y 90 for later recurrences.  An MRI of the liver dated 11/21/2017 showed left hepatic lobe lesion measuring 2.6 x 2.1 cm.  No other lesions were seen. -He underwent left hepatic lobe microwave ablation on 12/10/2017.  He did very well with minimal pain and complications.  He is able to do all his day-to-day activities without any problems. -Last CEA level on 12/04/2017 was 3.3.  I plan to repeat his CT scans in 5 weeks and see him back after that.  If he continues to be in remission, we will continue CT scans once every 3 months.  2.  Diabetes: He will continue metformin 500 mg daily along with glipizide once daily.  He will also continue Lipitor.   3.  Peripheral neuropathy: Numbness in the feet, predominantly at bedtime is stable.  4.  Hypertension: He will continue valsartan HCTZ 160-45m daily.      Orders placed this encounter:  Orders Placed This Encounter  Procedures  . CT Chest W Contrast  . CT Abdomen Pelvis W Contrast  . CBC with Differential/Platelet  . Comprehensive metabolic panel  . CEA      SDerek Jack MD [phone]

## 2018-01-01 NOTE — Patient Instructions (Signed)
[place] at [facility] Discharge Instructions  Follow up in 5 weeks with labs and CT scans prior to your visit,    Thank you for choosing [place] at [facility] to provide your oncology and hematology care.  To afford each patient quality time with our provider, please arrive at least 15 minutes before your scheduled appointment time.   If you have a lab appointment with the [facility] please come in thru the  Main Entrance and check in at the main information desk  You need to re-schedule your appointment should you arrive 10 or more minutes late.  We strive to give you quality time with our providers, and arriving late affects you and other patients whose appointments are after yours.  Also, if you no show three or more times for appointments you may be dismissed from the clinic at the providers discretion.     Again, thank you for choosing [facility].  Our hope is that these requests will decrease the amount of time that you wait before being seen by our physicians.       _____________________________________________________________  Should you have questions after your visit to [facility], please contact our office at [phone] between the hours of 8:00 a.m. and 4:30 p.m.  Voicemails left after 4:00 p.m. will not be returned until the following business day.  For prescription refill requests, have your pharmacy contact our office and allow 72 hours.    Cancer Center Support Programs:   > Cancer Support Group  2nd Tuesday of the month 1pm-2pm, Journey Room

## 2018-01-01 NOTE — Assessment & Plan Note (Addendum)
1.  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  He tolerated it very well.  We discussed the results of the CT scan CAP which showed a stable left upper lobe subcentimeter lung nodule.  Segment 4 lesion in the liver has decreased to 2.4 cm.  No other clearly evident liver lesions present.  We discussed the option of giving him a break from chemotherapy versus putting him on maintenance 5-FU/Xeloda.  He opted for chemotherapy holiday. -He was evaluated by Dr. Anselm Pancoast, who thought microwave thermal ablation would be the best procedure for him.  He would like to save  Y 90 for later recurrences.  An MRI of the liver dated 11/21/2017 showed left hepatic lobe lesion measuring 2.6 x 2.1 cm.  No other lesions were seen. -He underwent left hepatic lobe microwave ablation on 12/10/2017.  He did very well with minimal pain and complications.  He is able to do all his day-to-day activities without any problems. -Last CEA level on 12/04/2017 was 3.3.  I plan to repeat his CT scans in 5 weeks and see him back after that.  If he continues to be in remission, we will continue CT scans once every 3 months.  2.  Diabetes: He will continue metformin 500 mg daily along with glipizide once daily.  He will also continue Lipitor.   3.  Peripheral neuropathy: Numbness in the feet, predominantly at bedtime is stable.  4.  Hypertension: He will continue valsartan HCTZ 160-48m daily.

## 2018-01-03 LAB — CEA: CEA: 5.3 ng/mL — ABNORMAL HIGH (ref 0.0–4.7)

## 2018-02-05 ENCOUNTER — Ambulatory Visit (HOSPITAL_COMMUNITY)
Admission: RE | Admit: 2018-02-05 | Discharge: 2018-02-05 | Disposition: A | Discharge status: Home / Self Care | Payer: Medicare PPO | Source: Ambulatory Visit | Attending: Nurse Practitioner | Admitting: Nurse Practitioner

## 2018-02-05 ENCOUNTER — Inpatient Hospital Stay (HOSPITAL_COMMUNITY): Payer: Medicare PPO | Attending: Hematology

## 2018-02-05 DIAGNOSIS — Z9889 Other specified postprocedural states: Secondary | ICD-10-CM | POA: Insufficient documentation

## 2018-02-05 DIAGNOSIS — Z7984 Long term (current) use of oral hypoglycemic drugs: Secondary | ICD-10-CM | POA: Diagnosis not present

## 2018-02-05 DIAGNOSIS — Z87891 Personal history of nicotine dependence: Secondary | ICD-10-CM | POA: Insufficient documentation

## 2018-02-05 DIAGNOSIS — K769 Liver disease, unspecified: Secondary | ICD-10-CM | POA: Diagnosis not present

## 2018-02-05 DIAGNOSIS — C787 Secondary malignant neoplasm of liver and intrahepatic bile duct: Secondary | ICD-10-CM | POA: Diagnosis not present

## 2018-02-05 DIAGNOSIS — K7689 Other specified diseases of liver: Secondary | ICD-10-CM | POA: Insufficient documentation

## 2018-02-05 DIAGNOSIS — C189 Malignant neoplasm of colon, unspecified: Secondary | ICD-10-CM | POA: Diagnosis not present

## 2018-02-05 DIAGNOSIS — R911 Solitary pulmonary nodule: Secondary | ICD-10-CM | POA: Diagnosis not present

## 2018-02-05 DIAGNOSIS — I10 Essential (primary) hypertension: Secondary | ICD-10-CM | POA: Insufficient documentation

## 2018-02-05 DIAGNOSIS — E1142 Type 2 diabetes mellitus with diabetic polyneuropathy: Secondary | ICD-10-CM | POA: Diagnosis not present

## 2018-02-05 DIAGNOSIS — R918 Other nonspecific abnormal finding of lung field: Secondary | ICD-10-CM | POA: Diagnosis not present

## 2018-02-05 DIAGNOSIS — Z79899 Other long term (current) drug therapy: Secondary | ICD-10-CM | POA: Insufficient documentation

## 2018-02-05 LAB — COMPREHENSIVE METABOLIC PANEL
ALT: 27 U/L (ref 0–44)
ANION GAP: 9 (ref 5–15)
AST: 27 U/L (ref 15–41)
Albumin: 4.2 g/dL (ref 3.5–5.0)
Alkaline Phosphatase: 93 U/L (ref 38–126)
BILIRUBIN TOTAL: 0.6 mg/dL (ref 0.3–1.2)
BUN: 11 mg/dL (ref 8–23)
CHLORIDE: 100 mmol/L (ref 98–111)
CO2: 27 mmol/L (ref 22–32)
CREATININE: 1.07 mg/dL (ref 0.61–1.24)
Calcium: 9.1 mg/dL (ref 8.9–10.3)
Glucose, Bld: 158 mg/dL — ABNORMAL HIGH (ref 70–99)
POTASSIUM: 3.8 mmol/L (ref 3.5–5.1)
Sodium: 136 mmol/L (ref 135–145)
TOTAL PROTEIN: 7.5 g/dL (ref 6.5–8.1)

## 2018-02-05 LAB — CBC WITH DIFFERENTIAL/PLATELET
Abs Immature Granulocytes: 0.03 10*3/uL (ref 0.00–0.07)
BASOS PCT: 1 %
Basophils Absolute: 0.1 10*3/uL (ref 0.0–0.1)
EOS ABS: 0.3 10*3/uL (ref 0.0–0.5)
EOS PCT: 4 %
HCT: 37.8 % — ABNORMAL LOW (ref 39.0–52.0)
Hemoglobin: 12.3 g/dL — ABNORMAL LOW (ref 13.0–17.0)
IMMATURE GRANULOCYTES: 0 %
Lymphocytes Relative: 29 %
Lymphs Abs: 2.1 10*3/uL (ref 0.7–4.0)
MCH: 29.6 pg (ref 26.0–34.0)
MCHC: 32.5 g/dL (ref 30.0–36.0)
MCV: 90.9 fL (ref 80.0–100.0)
MONO ABS: 0.5 10*3/uL (ref 0.1–1.0)
Monocytes Relative: 7 %
NEUTROS PCT: 59 %
Neutro Abs: 4.4 10*3/uL (ref 1.7–7.7)
PLATELETS: 194 10*3/uL (ref 150–400)
RBC: 4.16 MIL/uL — ABNORMAL LOW (ref 4.22–5.81)
RDW: 13.9 % (ref 11.5–15.5)
WBC: 7.4 10*3/uL (ref 4.0–10.5)
nRBC: 0 % (ref 0.0–0.2)

## 2018-02-05 MED ORDER — IOPAMIDOL (ISOVUE-300) INJECTION 61%
100.0000 mL | Freq: Once | INTRAVENOUS | Status: AC | PRN
  Administered 2018-02-05: 100 mL via INTRAVENOUS

## 2018-02-06 LAB — CEA: CEA1: 19.9 ng/mL — AB (ref 0.0–4.7)

## 2018-02-09 ENCOUNTER — Inpatient Hospital Stay (HOSPITAL_COMMUNITY): Payer: Medicare PPO | Admitting: Hematology

## 2018-02-09 ENCOUNTER — Encounter (HOSPITAL_COMMUNITY): Payer: Self-pay | Admitting: Hematology

## 2018-02-09 ENCOUNTER — Other Ambulatory Visit: Payer: Self-pay

## 2018-02-09 VITALS — BP 126/82 | HR 67 | Temp 97.8°F | Resp 16 | Wt 222.0 lb

## 2018-02-09 DIAGNOSIS — Z7984 Long term (current) use of oral hypoglycemic drugs: Secondary | ICD-10-CM

## 2018-02-09 DIAGNOSIS — C787 Secondary malignant neoplasm of liver and intrahepatic bile duct: Secondary | ICD-10-CM | POA: Diagnosis not present

## 2018-02-09 DIAGNOSIS — C189 Malignant neoplasm of colon, unspecified: Secondary | ICD-10-CM | POA: Diagnosis not present

## 2018-02-09 DIAGNOSIS — Z79899 Other long term (current) drug therapy: Secondary | ICD-10-CM | POA: Diagnosis not present

## 2018-02-09 DIAGNOSIS — I10 Essential (primary) hypertension: Secondary | ICD-10-CM | POA: Diagnosis not present

## 2018-02-09 DIAGNOSIS — E1142 Type 2 diabetes mellitus with diabetic polyneuropathy: Secondary | ICD-10-CM

## 2018-02-09 DIAGNOSIS — Z87891 Personal history of nicotine dependence: Secondary | ICD-10-CM

## 2018-02-09 DIAGNOSIS — R911 Solitary pulmonary nodule: Secondary | ICD-10-CM | POA: Diagnosis not present

## 2018-02-09 NOTE — Assessment & Plan Note (Addendum)
1.  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  He tolerated it very well.  We discussed the results of the CT scan CAP which showed a stable left upper lobe subcentimeter lung nodule.  Segment 4 lesion in the liver has decreased to 2.4 cm.  No other clearly evident liver lesions present.  We discussed the option of giving him a break from chemotherapy versus putting him on maintenance 5-FU/Xeloda.  He opted for chemotherapy holiday. -He was evaluated by Dr. Anselm Pancoast, who thought microwave thermal ablation would be the best procedure for him.  He would like to save  Y 90 for later recurrences.  An MRI of the liver dated 11/21/2017 showed left hepatic lobe lesion measuring 2.6 x 2.1 cm.  No other lesions were seen. -He underwent left hepatic lobe microwave ablation on 12/10/2017.  He did very well with minimal pain and complications.  He is able to do all his day-to-day activities without any problems. - His CEA went up to 19.9.  He does not have any new pains.  His functional status is excellent. -I discussed the results of the CT scan of the abdomen and pelvis dated 02/05/2018 showing left hepatic lobe lesion with central high attenuation and a large area of low-attenuation.  There is a new left hepatic lobe lesion measuring 1.3 cm.  Similar appearing small nodules within the lungs are seen on the chest CT. -I have recommended doing an MRI with and without gadolinium.  If it is a solitary lesion in the left hepatic lobe, we will consider referring him back to Dr. Anselm Pancoast for local regional therapy.  2.  Diabetes: His continue metformin 500 mg daily along with glipizide once daily.  He will also continue Lipitor.    3.  Peripheral neuropathy: Numbness in the feet, predominantly at bedtime is stable.  4.  Hypertension: He will continue valsartan HCTZ 160-70m daily.

## 2018-02-09 NOTE — Patient Instructions (Signed)
[place] at [facility] Discharge Instructions  Follow up in 2 weeks after your MRI scan.   Thank you for choosing [place] at [facility] to provide your oncology and hematology care.  To afford each patient quality time with our provider, please arrive at least 15 minutes before your scheduled appointment time.   If you have a lab appointment with the [facility] please come in thru the  Main Entrance and check in at the main information desk  You need to re-schedule your appointment should you arrive 10 or more minutes late.  We strive to give you quality time with our providers, and arriving late affects you and other patients whose appointments are after yours.  Also, if you no show three or more times for appointments you may be dismissed from the clinic at the providers discretion.     Again, thank you for choosing [facility].  Our hope is that these requests will decrease the amount of time that you wait before being seen by our physicians.       _____________________________________________________________  Should you have questions after your visit to [facility], please contact our office at [phone] between the hours of 8:00 a.m. and 4:30 p.m.  Voicemails left after 4:00 p.m. will not be returned until the following business day.  For prescription refill requests, have your pharmacy contact our office and allow 72 hours.    Cancer Center Support Programs:   > Cancer Support Group  2nd Tuesday of the month 1pm-2pm, Journey Room

## 2018-02-09 NOTE — Progress Notes (Signed)
Christopher Friedman, Christopher Friedman   CLINIC:  Medical Oncology/Hematology  PCP:  [name], MD [place] [address] [phone]   REASON FOR VISIT: Follow-up for metastatic colon cancer to the liver  CURRENT THERAPY: S/P microwave ablation to the liver mass  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 ([place])   05/27/2017 -  Chemotherapy    The patient had palonosetron (ALOXI) injection 0.25 mg, 0.25 mg, Intravenous,  Once, 12 of 12 cycles Administration: 0.25 mg (09/03/2017), 0.25 mg (10/01/2017), 0.25 mg (10/15/2017), 0.25 mg (10/29/2017), 0.25 mg (09/17/2017) irinotecan (CAMPTOSAR) 400 mg in dextrose 5 % 500 mL chemo infusion, 180 mg/m2, Intravenous,  Once, 12 of 12 cycles Dose modification: 144 mg/m2 (Cycle 4, Reason: Provider Judgment) Administration: 320 mg (09/03/2017), 320 mg (10/01/2017), 320 mg (10/15/2017), 320 mg (10/29/2017), 320 mg (09/17/2017) leucovorin 888 mg in dextrose 5 % 250 mL infusion, 400 mg/m2, Intravenous,  Once, 12 of 12 cycles Administration: 900 mg (09/03/2017), 900 mg (10/01/2017), 900 mg (10/15/2017), 900 mg (10/29/2017), 900 mg (09/17/2017) fluorouracil (ADRUCIL) chemo injection 900 mg, 400 mg/m2, Intravenous,  Once, 12 of 12 cycles Dose modification: 320 mg/m2 (Cycle 4, Reason: Provider Judgment) Administration: 700 mg (09/03/2017), 700 mg (10/01/2017), 700 mg (10/15/2017), 700 mg (10/29/2017), 700 mg (09/17/2017) fluorouracil (ADRUCIL) 5,350 mg in sodium chloride 0.9 % 143 mL chemo infusion, 2,400 mg/m2, Intravenous, 1 Day/Dose, 12 of 12 cycles Dose modification: 1,920 mg/m2 (Cycle 4, Reason: Provider Judgment) Administration: 4,250 mg (09/03/2017), 4,250 mg (10/01/2017), 4,250 mg (10/15/2017), 4,250 mg (10/29/2017), 4,250 mg (09/17/2017)  for chemotherapy treatment.     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 [facility])      CANCER STAGING: Cancer Staging Adenocarcinoma of  colon metastatic to liver [facility]) Staging form: Colon and Rectum, AJCC 8th Edition - Clinical stage from 07/13/2017: Stage IVB (pM1b) - Signed by Derek Jack, MD on 07/13/2017    INTERVAL HISTORY:  Christopher Friedman 67 y.o. male returns for routine follow-up for metastatic colon cancer to the liver. He here today and doing well since he had the microwave ablation. He has no complaints at this time. He lives at home and performs all his own ADLs. He reports his appetite and energy level at 100% and has no problem maintaining his weight. He denies any new pains. Denies any bleeding or dark stools. Denies any headaches or vision changes.     REVIEW OF SYSTEMS:  Review of Systems  All other systems reviewed and are negative.    PAST MEDICAL/SURGICAL HISTORY:  Past Medical History:  Diagnosis Date  . Colon cancer ([place])   . Diabetes ([place])   . GERD (gastroesophageal reflux disease)   . Hypertension    Past Surgical History:  Procedure Laterality Date  . APPENDECTOMY     during colon surgery  . COLON SURGERY    . IR RADIOLOGIST EVAL & MGMT  11/26/2017  . SHOULDER SURGERY  2003     SOCIAL HISTORY:  Social History   Socioeconomic History  . Marital status: Married    Spouse name: Not on file  . Number of children: Not on file  . Years of education: Not on file  . Highest education level: Not on file  Occupational History  . Not on file  Social Needs  . Financial resource strain: Not on file  . Food insecurity:    Worry: Not on file  Inability: Not on file  . Transportation needs:    Medical: Not on file    Non-medical: Not on file  Tobacco Use  . Smoking status: Former Smoker    Last attempt to quit: 04/05/1995    Years since quitting: 22.8  . Smokeless tobacco: Never Used  Substance and Sexual Activity  . Alcohol use: Not Currently    Alcohol/week: 0.0 standard drinks    Comment: used to drink but stopped about 20 years ago  . Drug use: Never  . Sexual activity:  Not on file  Lifestyle  . Physical activity:    Days per week: Not on file    Minutes per session: Not on file  . Stress: Not on file  Relationships  . Social connections:    Talks on phone: Not on file    Gets together: Not on file    Attends religious service: Not on file    Active member of club or organization: Not on file    Attends meetings of clubs or organizations: Not on file    Relationship status: Not on file  . Intimate partner violence:    Fear of current or ex partner: Not on file    Emotionally abused: Not on file    Physically abused: Not on file    Forced sexual activity: Not on file  Other Topics Concern  . Not on file  Social History Narrative  . Not on file    FAMILY HISTORY:  Family History  Problem Relation Age of Onset  . Diabetes Mother   . Cancer Father   . Bone cancer Father   . Colon cancer Neg Hx   . Colon polyps Neg Hx     CURRENT MEDICATIONS:  Outpatient Encounter Medications as of 02/09/2018  Medication Sig  . Alpha-Lipoic Acid 200 MG CAPS Take 1 capsule by mouth daily.  . Ascorbic Acid (VITAMIN C) 1000 MG tablet Take 1,000 mg by mouth daily.  . ASHWAGANDHA PO Take 800 mg by mouth daily.  Marland Kitchen aspirin 81 MG tablet Take 81 mg by mouth daily.  Marland Kitchen atorvastatin (LIPITOR) 10 MG tablet Take 10 mg by mouth daily.   Marland Kitchen BEE POLLEN PO Take 1 capsule by mouth daily.   . Blood Glucose Monitoring Suppl (ONE TOUCH ULTRA MINI) W/DEVICE KIT USE TO CHECK BLOOD SUGAR DAILY.  . cholecalciferol (VITAMIN D) 1000 units tablet Take 1,000 Units by mouth daily.  Marland Kitchen CINNAMON PO Take 1,000 mg by mouth daily.  Marland Kitchen Cod Liver Oil CAPS Take 1 capsule by mouth daily.  . Coenzyme Q10 (CO Q 10) 100 MG CAPS Take 1 capsule by mouth daily.   . Cyanocobalamin (VITAMIN B-12) 5000 MCG TBDP Take 5,000 mcg by mouth daily.  . Flax Oil-Fish Oil-Borage Oil (FISH OIL-FLAX OIL-BORAGE OIL) CAPS Take 1 capsule by mouth daily.  . Garlic 9826 MG CAPS Take 1 capsule by mouth daily.  Marland Kitchen  glipiZIDE (GLUCOTROL XL) 10 MG 24 hr tablet Take 10 mg by mouth daily.   . Glucosamine-Chondroit-Collagen ([employer] GLUCO-CHONDROIT PLUS UC-II PO) Take 1 tablet by mouth daily.   Marland Kitchen LECITHIN PO Take 1 capsule by mouth daily.  . magnesium oxide (MAG-OX) 400 MG tablet Take 400 mg by mouth daily.   . metFORMIN (GLUCOPHAGE) 500 MG tablet Take 1 tablet (500 mg total) by mouth 2 (two) times daily with a meal. (Patient taking differently: Take 500 mg by mouth daily with breakfast. )  . milk thistle 175 MG tablet Take 175  mg by mouth daily.  Marland Kitchen OVER THE COUNTER MEDICATION Take 1 capsule by mouth daily. Mushroom Complex 750 mg  . OVER THE COUNTER MEDICATION Take 1 capsule by mouth daily. Cura Med 750 mg  . oxyCODONE-acetaminophen (PERCOCET/ROXICET) 5-325 MG tablet Take 1 tablet by mouth every 6 (six) hours as needed. for pain  . valsartan-hydrochlorothiazide (DIOVAN-HCT) 160-25 MG tablet Take 1 tablet by mouth daily.   No facility-administered encounter medications on file as of 02/09/2018.     ALLERGIES:  No Known Allergies   PHYSICAL EXAM:  ECOG Performance status: 1  Vitals:   02/09/18 1157  BP: 126/82  Pulse: 67  Resp: 16  Temp: 97.8 F (36.6 C)  SpO2: 98%   Filed Weights   02/09/18 1157  Weight: 222 lb (100.7 kg)    Physical Exam  Constitutional: He is oriented to person, place, and time. He appears well-developed and well-nourished.  Musculoskeletal: Normal range of motion.  Neurological: He is alert and oriented to person, place, and time.  Skin: Skin is warm and dry.  Psychiatric: He has a normal mood and affect. His behavior is normal. Judgment and thought content normal.  Abdominal: soft, non-tender, non distended, no pain   LABORATORY DATA:  I have reviewed the labs as listed.  CBC    Component Value Date/Time   WBC 7.4 02/05/2018 1351   RBC 4.16 (L) 02/05/2018 1351   HGB 12.3 (L) 02/05/2018 1351   HCT 37.8 (L) 02/05/2018 1351   PLT 194 02/05/2018 1351   MCV 90.9  02/05/2018 1351   MCH 29.6 02/05/2018 1351   MCHC 32.5 02/05/2018 1351   RDW 13.9 02/05/2018 1351   LYMPHSABS 2.1 02/05/2018 1351   MONOABS 0.5 02/05/2018 1351   EOSABS 0.3 02/05/2018 1351   BASOSABS 0.1 02/05/2018 1351   CMP Latest Ref Rng & Units 02/05/2018 01/01/2018 12/04/2017  Glucose 70 - 99 mg/dL 158(H) 137(H) 111(H)  BUN 8 - 23 mg/dL '11 14 18  ' Creatinine 0.61 - 1.24 mg/dL 1.07 1.10 1.13  Sodium 135 - 145 mmol/L 136 134(L) 140  Potassium 3.5 - 5.1 mmol/L 3.8 4.1 4.3  Chloride 98 - 111 mmol/L 100 101 102  CO2 22 - 32 mmol/L '27 26 27  ' Calcium 8.9 - 10.3 mg/dL 9.1 9.2 9.7  Total Protein 6.5 - 8.1 g/dL 7.5 7.6 7.6  Total Bilirubin 0.3 - 1.2 mg/dL 0.6 0.6 0.5  Alkaline Phos 38 - 126 U/L 93 100 70  AST 15 - 41 U/L '27 28 27  ' ALT 0 - 44 U/L '27 28 28       ' DIAGNOSTIC IMAGING:  I have independently reviewed images of the CT scan dated 02/05/2018 and discussed with the patient.     ASSESSMENT & PLAN:   Adenocarcinoma of colon metastatic to liver ([place]) 1.  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  He tolerated it very well.  We discussed the results of the CT scan CAP which showed a stable left upper lobe subcentimeter lung nodule.  Segment 4 lesion in the liver has decreased to 2.4 cm.  No other clearly evident liver lesions present.  We discussed the option of giving him a  break from chemotherapy versus putting him on maintenance 5-FU/Xeloda.  He opted for chemotherapy holiday. -He was evaluated by Dr. Anselm Pancoast, who thought microwave thermal ablation would be the best procedure for him.  He would like to save  Y 90 for later recurrences.  An MRI of the liver dated 11/21/2017 showed left  hepatic lobe lesion measuring 2.6 x 2.1 cm.  No other lesions were seen. -He underwent left hepatic lobe microwave ablation on 12/10/2017.  He did very well with minimal pain and complications.  He is able to do all his day-to-day activities without any problems. - His CEA went up to 19.9.  He does not have any new pains.  His functional status is excellent. -I discussed the results of the CT scan of the abdomen and pelvis dated 02/05/2018 showing left hepatic lobe lesion with central high attenuation and a large area of low-attenuation.  There is a new left hepatic lobe lesion measuring 1.3 cm.  Similar appearing small nodules within the lungs are seen on the chest CT. -I have recommended doing an MRI with and without gadolinium.  If it is a solitary lesion in the left hepatic lobe, we will consider referring him back to Dr. Anselm Pancoast for local regional therapy.  2.  Diabetes: His continue metformin 500 mg daily along with glipizide once daily.  He will also continue Lipitor.   3.  Peripheral neuropathy: Numbness in the feet, predominantly at bedtime is stable.  4.  Hypertension: He will continue valsartan HCTZ 160-65m daily.      Orders placed this encounter:  Orders Placed This Encounter  Procedures  . MR LIVER W WO CONTRAST  . CBC with Differential/Platelet  . Comprehensive metabolic panel  . CEA      SDerek Jack MD [phone]

## 2018-02-12 ENCOUNTER — Ambulatory Visit (HOSPITAL_COMMUNITY): Payer: Medicare PPO | Admitting: Hematology

## 2018-02-16 ENCOUNTER — Encounter (HOSPITAL_COMMUNITY): Payer: Self-pay | Admitting: Diagnostic Radiology

## 2018-02-17 ENCOUNTER — Ambulatory Visit (HOSPITAL_COMMUNITY): Payer: Medicare PPO

## 2018-02-23 ENCOUNTER — Ambulatory Visit (HOSPITAL_COMMUNITY): Payer: Medicare PPO | Admitting: Hematology

## 2018-02-25 ENCOUNTER — Ambulatory Visit (HOSPITAL_COMMUNITY)
Admission: RE | Admit: 2018-02-25 | Discharge: 2018-02-25 | Disposition: A | Discharge status: Home / Self Care | Payer: Medicare PPO | Source: Ambulatory Visit | Attending: Nurse Practitioner | Admitting: Nurse Practitioner

## 2018-02-25 DIAGNOSIS — E782 Mixed hyperlipidemia: Secondary | ICD-10-CM | POA: Diagnosis not present

## 2018-02-25 DIAGNOSIS — E1169 Type 2 diabetes mellitus with other specified complication: Secondary | ICD-10-CM | POA: Diagnosis not present

## 2018-02-25 DIAGNOSIS — C787 Secondary malignant neoplasm of liver and intrahepatic bile duct: Secondary | ICD-10-CM | POA: Diagnosis not present

## 2018-02-25 DIAGNOSIS — C189 Malignant neoplasm of colon, unspecified: Secondary | ICD-10-CM | POA: Diagnosis not present

## 2018-02-25 DIAGNOSIS — I700 Atherosclerosis of aorta: Secondary | ICD-10-CM | POA: Insufficient documentation

## 2018-02-25 DIAGNOSIS — Z6830 Body mass index (BMI) 30.0-30.9, adult: Secondary | ICD-10-CM | POA: Diagnosis not present

## 2018-02-25 DIAGNOSIS — I10 Essential (primary) hypertension: Secondary | ICD-10-CM | POA: Diagnosis not present

## 2018-02-25 DIAGNOSIS — Z0001 Encounter for general adult medical examination with abnormal findings: Secondary | ICD-10-CM | POA: Diagnosis not present

## 2018-02-25 MED ORDER — GADOBUTROL 1 MMOL/ML IV SOLN
10.0000 mL | Freq: Once | INTRAVENOUS | Status: AC | PRN
  Administered 2018-02-25: 10 mL via INTRAVENOUS

## 2018-02-26 ENCOUNTER — Other Ambulatory Visit (HOSPITAL_COMMUNITY): Payer: Self-pay | Admitting: Diagnostic Radiology

## 2018-02-26 DIAGNOSIS — C787 Secondary malignant neoplasm of liver and intrahepatic bile duct: Principal | ICD-10-CM

## 2018-02-26 DIAGNOSIS — C189 Malignant neoplasm of colon, unspecified: Secondary | ICD-10-CM

## 2018-02-27 ENCOUNTER — Inpatient Hospital Stay (HOSPITAL_COMMUNITY): Payer: Medicare PPO | Attending: Hematology | Admitting: Hematology

## 2018-02-27 ENCOUNTER — Encounter (HOSPITAL_COMMUNITY): Payer: Self-pay | Admitting: Hematology

## 2018-02-27 VITALS — BP 130/87 | HR 69 | Temp 97.9°F | Resp 18 | Wt 225.6 lb

## 2018-02-27 DIAGNOSIS — C787 Secondary malignant neoplasm of liver and intrahepatic bile duct: Secondary | ICD-10-CM | POA: Diagnosis not present

## 2018-02-27 DIAGNOSIS — Z452 Encounter for adjustment and management of vascular access device: Secondary | ICD-10-CM | POA: Insufficient documentation

## 2018-02-27 DIAGNOSIS — C189 Malignant neoplasm of colon, unspecified: Secondary | ICD-10-CM | POA: Diagnosis not present

## 2018-02-27 DIAGNOSIS — Z5111 Encounter for antineoplastic chemotherapy: Secondary | ICD-10-CM | POA: Diagnosis not present

## 2018-02-27 DIAGNOSIS — E1142 Type 2 diabetes mellitus with diabetic polyneuropathy: Secondary | ICD-10-CM

## 2018-02-27 DIAGNOSIS — Z23 Encounter for immunization: Secondary | ICD-10-CM | POA: Insufficient documentation

## 2018-02-27 DIAGNOSIS — C7800 Secondary malignant neoplasm of unspecified lung: Secondary | ICD-10-CM

## 2018-02-27 DIAGNOSIS — K769 Liver disease, unspecified: Secondary | ICD-10-CM | POA: Diagnosis not present

## 2018-02-27 DIAGNOSIS — R200 Anesthesia of skin: Secondary | ICD-10-CM | POA: Diagnosis not present

## 2018-02-27 DIAGNOSIS — I10 Essential (primary) hypertension: Secondary | ICD-10-CM | POA: Diagnosis not present

## 2018-02-27 DIAGNOSIS — Z87891 Personal history of nicotine dependence: Secondary | ICD-10-CM

## 2018-02-27 MED ORDER — LIDOCAINE-PRILOCAINE 2.5-2.5 % EX CREA: TOPICAL_CREAM | CUTANEOUS | 2 refills | Status: AC

## 2018-02-27 MED ORDER — PROCHLORPERAZINE MALEATE 10 MG PO TABS: 10.0000 mg | ORAL_TABLET | Freq: Four times a day (QID) | ORAL | 2 refills | Status: AC | PRN

## 2018-02-27 NOTE — Progress Notes (Signed)
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 ([place])   05/27/2017 -  Chemotherapy    The patient had palonosetron (ALOXI) injection 0.25 mg, 0.25 mg, Intravenous,  Once, 12 of 16 cycles Administration: 0.25 mg (09/03/2017), 0.25 mg (10/01/2017), 0.25 mg (10/15/2017), 0.25 mg (10/29/2017), 0.25 mg (09/17/2017) irinotecan (CAMPTOSAR) 400 mg in dextrose 5 % 500 mL chemo infusion, 180 mg/m2, Intravenous,  Once, 12 of 16 cycles Dose modification: 144 mg/m2 (Cycle 4, Reason: Provider Judgment) Administration: 320 mg (09/03/2017), 320 mg (10/01/2017), 320 mg (10/15/2017), 320 mg (10/29/2017), 320 mg (09/17/2017) leucovorin 888 mg in dextrose 5 % 250 mL infusion, 400 mg/m2, Intravenous,  Once, 12 of 16 cycles Administration: 900 mg (09/03/2017), 900 mg (10/01/2017), 900 mg (10/15/2017), 900 mg (10/29/2017), 900 mg (09/17/2017) fluorouracil (ADRUCIL) chemo injection 900 mg, 400 mg/m2, Intravenous,  Once, 12 of 16 cycles Dose modification: 320 mg/m2 (Cycle 4, Reason: Provider Judgment) Administration: 700 mg (09/03/2017), 700 mg (10/01/2017), 700 mg (10/15/2017), 700 mg (10/29/2017), 700 mg (09/17/2017) fluorouracil (ADRUCIL) 5,350 mg in sodium chloride 0.9 % 143 mL chemo infusion, 2,400 mg/m2, Intravenous, 1 Day/Dose, 12 of 16 cycles Dose modification: 1,920 mg/m2 (Cycle 4, Reason: Provider Judgment) Administration: 4,250 mg (09/03/2017), 4,250 mg (10/01/2017), 4,250 mg (10/15/2017), 4,250 mg (10/29/2017), 4,250 mg (09/17/2017)  for chemotherapy treatment.     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 male returns for routine follow-up for metastatic colon cancer to the liver. Patient is here and feeling well. He has slight numbness in his feet however he states it has been better lately and is stable at this time. He denies any new pains. Denies any nasuea, vomiting, or diarrhea. Denies any bleeding or dark stools. He reports his appetite at 100% and has no problems maintaining his weight. His energy is also at 100% at this time. He lives at home and performs all his own ADLs and activities.     REVIEW OF SYSTEMS:  Review of Systems  Neurological: Positive for numbness.  All other systems reviewed and are negative.    PAST MEDICAL/SURGICAL HISTORY:  Past Medical History:  Diagnosis Date  . Colon cancer ([place])   . Diabetes ([place])   . GERD (gastroesophageal reflux disease)   . Hypertension    Past Surgical History:  Procedure Laterality Date  . APPENDECTOMY     during colon surgery  . COLON SURGERY    . IR RADIOLOGIST EVAL & MGMT  11/26/2017  . RADIOFREQUENCY ABLATION N/A 12/10/2017   Procedure: CT MICROWAVE THERMAL ABLATION (LIVER);  Surgeon: Christopher Daft, MD;  Location: [facility] ORS;  Service: Anesthesiology;  Laterality: N/A;  . SHOULDER SURGERY  2003     SOCIAL HISTORY:  Social History   Socioeconomic History  . Marital status: Married    Spouse name: Not on file  . Number of  children: Not on file  . Years of education: Not on file  . Highest education level: Not on file  Occupational History  . Not on file  Social Needs  . Financial resource strain: Not on file  . Food insecurity:    Worry: Not on file    Inability: Not on file  . Transportation needs:    Medical: Not on file    Non-medical: Not on file  Tobacco Use  . Smoking status: Former  Smoker    Last attempt to quit: 04/05/1995    Years since quitting: 22.9  . Smokeless tobacco: Never Used  Substance and Sexual Activity  . Alcohol use: Not Currently    Alcohol/week: 0.0 standard drinks    Comment: used to drink but stopped about 20 years ago  . Drug use: Never  . Sexual activity: Not on file  Lifestyle  . Physical activity:    Days per week: Not on file    Minutes per session: Not on file  . Stress: Not on file  Relationships  . Social connections:    Talks on phone: Not on file    Gets together: Not on file    Attends religious service: Not on file    Active member of club or organization: Not on file    Attends meetings of clubs or organizations: Not on file    Relationship status: Not on file  . Intimate partner violence:    Fear of current or ex partner: Not on file    Emotionally abused: Not on file    Physically abused: Not on file    Forced sexual activity: Not on file  Other Topics Concern  . Not on file  Social History Narrative  . Not on file    FAMILY HISTORY:  Family History  Problem Relation Age of Onset  . Diabetes Mother   . Cancer Father   . Bone cancer Father   . Colon cancer Neg Hx   . Colon polyps Neg Hx     CURRENT MEDICATIONS:  Outpatient Encounter Medications as of 02/27/2018  Medication Sig  . Alpha-Lipoic Acid 200 MG CAPS Take 1 capsule by mouth daily.  . Ascorbic Acid (VITAMIN C) 1000 MG tablet Take 1,000 mg by mouth daily.  . ASHWAGANDHA PO Take 800 mg by mouth daily.  Marland Kitchen aspirin 81 MG tablet Take 81 mg by mouth daily.  Marland Kitchen atorvastatin (LIPITOR) 10 MG tablet Take 10 mg by mouth daily.   Marland Kitchen BEE POLLEN PO Take 1 capsule by mouth daily.   . Blood Glucose Monitoring Suppl (ONE TOUCH ULTRA MINI) W/DEVICE KIT USE TO CHECK BLOOD SUGAR DAILY.  . cholecalciferol (VITAMIN D) 1000 units tablet Take 1,000 Units by mouth daily.  Marland Kitchen CINNAMON PO Take 1,000 mg by mouth daily.  Marland Kitchen Cod Liver Oil CAPS Take 1 capsule by mouth daily.  .  Coenzyme Q10 (CO Q 10) 100 MG CAPS Take 1 capsule by mouth daily.   . Cyanocobalamin (VITAMIN B-12) 5000 MCG TBDP Take 5,000 mcg by mouth daily.  . Flax Oil-Fish Oil-Borage Oil (FISH OIL-FLAX OIL-BORAGE OIL) CAPS Take 1 capsule by mouth daily.  . Garlic 1478 MG CAPS Take 1 capsule by mouth daily.  Marland Kitchen glipiZIDE (GLUCOTROL XL) 10 MG 24 hr tablet Take 10 mg by mouth daily.   . Glucosamine-Chondroit-Collagen ([employer] GLUCO-CHONDROIT PLUS UC-II PO) Take 1 tablet by mouth daily.   Marland Kitchen LECITHIN PO Take 1 capsule by mouth daily.  . magnesium oxide (MAG-OX)  400 MG tablet Take 400 mg by mouth daily.   . metFORMIN (GLUCOPHAGE) 500 MG tablet Take 1 tablet (500 mg total) by mouth 2 (two) times daily with a meal. (Patient taking differently: Take 500 mg by mouth daily with breakfast. )  . milk thistle 175 MG tablet Take 175 mg by mouth daily.  Marland Kitchen OVER THE COUNTER MEDICATION Take 1 capsule by mouth daily. Mushroom Complex 750 mg  . OVER THE COUNTER MEDICATION Take 1 capsule by mouth daily. Cura Med 750 mg  . oxyCODONE-acetaminophen (PERCOCET/ROXICET) 5-325 MG tablet Take 1 tablet by mouth every 6 (six) hours as needed. for pain  . valsartan-hydrochlorothiazide (DIOVAN-HCT) 160-25 MG tablet Take 1 tablet by mouth daily.   No facility-administered encounter medications on file as of 02/27/2018.     ALLERGIES:  No Known Allergies   PHYSICAL EXAM:  ECOG Performance status: 1  Vitals:   02/27/18 0800  BP: 130/87  Pulse: 69  Resp: 18  Temp: 97.9 F (36.6 C)  SpO2: 99%   Filed Weights   02/27/18 0800  Weight: 225 lb 9.6 oz (102.3 kg)    Physical Exam  Constitutional: He is oriented to person, place, and time. He appears well-developed and well-nourished.  Cardiovascular: Normal rate, regular rhythm and normal heart sounds.  Pulmonary/Chest: Effort normal and breath sounds normal.  Musculoskeletal: Normal range of motion.  Neurological: He is alert and oriented to person, place, and time.  Skin: Skin is  warm and dry.  Psychiatric: He has a normal mood and affect. His behavior is normal. Judgment and thought content normal.     LABORATORY DATA:  I have reviewed the labs as listed.  CBC    Component Value Date/Time   WBC 7.4 02/05/2018 1351   RBC 4.16 (L) 02/05/2018 1351   HGB 12.3 (L) 02/05/2018 1351   HCT 37.8 (L) 02/05/2018 1351   PLT 194 02/05/2018 1351   MCV 90.9 02/05/2018 1351   MCH 29.6 02/05/2018 1351   MCHC 32.5 02/05/2018 1351   RDW 13.9 02/05/2018 1351   LYMPHSABS 2.1 02/05/2018 1351   MONOABS 0.5 02/05/2018 1351   EOSABS 0.3 02/05/2018 1351   BASOSABS 0.1 02/05/2018 1351   CMP Latest Ref Rng & Units 02/05/2018 01/01/2018 12/04/2017  Glucose 70 - 99 mg/dL 158(H) 137(H) 111(H)  BUN 8 - 23 mg/dL '11 14 18  ' Creatinine 0.61 - 1.24 mg/dL 1.07 1.10 1.13  Sodium 135 - 145 mmol/L 136 134(L) 140  Potassium 3.5 - 5.1 mmol/L 3.8 4.1 4.3  Chloride 98 - 111 mmol/L 100 101 102  CO2 22 - 32 mmol/L '27 26 27  ' Calcium 8.9 - 10.3 mg/dL 9.1 9.2 9.7  Total Protein 6.5 - 8.1 g/dL 7.5 7.6 7.6  Total Bilirubin 0.3 - 1.2 mg/dL 0.6 0.6 0.5  Alkaline Phos 38 - 126 U/L 93 100 70  AST 15 - 41 U/L '27 28 27  ' ALT 0 - 44 U/L '27 28 28       ' DIAGNOSTIC IMAGING:  I have independently reviewed MRI of the liver.  I discussed with the patient.     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  He tolerated it very well.  CT CAP showed stable left upper lobe subcentimeter lung nodule, segment 4 lesion in the liver has decreased to 2.4 cm.  -  He was evaluated by Dr. Anselm Pancoast, who thought microwave thermal ablation would be the best procedure for him.  He would like to save  Y 90 for later recurrences.  An MRI of the liver dated 11/21/2017 showed left hepatic lobe lesion measuring 2.6 x 2.1 cm.  No other lesions were seen. -He  underwent left hepatic lobe microwave ablation on 12/10/2017.  He did very well with minimal pain and complications.  He is able to do all his day-to-day activities without any problems. - Last CEA of went up to 19.9.  He is completely asymptomatic. -CT scan of the abdomen pelvis on 02/05/2018 showed left hepatic lesion with central high attenuation and a large area of low-attenuation.  There is a new left hepatic lobe lesion measuring 1.3 cm.  CT chest showed similar appearing small nodules. - MRI of the liver dated 02/25/2018 shows heterogeneous mass which surrounds the right side of the ablation defect and extends inferiorly in segment 4B measuring 6.9 x 5.6 cm, segment 2 lesion measuring 2.6 cm, segment 8 subcapsular 1.6 cm lesion.  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 - He has at least 3 new lesions and questionable capsular meta stasis.  I do not believe he is a candidate for local therapy.  I have recommended going back on chemotherapy.  His last chemo was in July 2019, dose reduced FOLFIRI. - We discussed about side effects in detail.  I believe he had bleeding when he was put on bevacizumab previously.  He is not a candidate for EGFR antibody as K-ras mutation was positive.  We will tentatively start his chemotherapy next week.  I will see him back in 2 weeks for follow-up.  2.  Diabetes: He is continuing metformin 500 mg daily and glipizide once daily.  He is also continuing Lipitor.  3.  Peripheral neuropathy: He has mild numbness in the feet, predominantly at bedtime.  This is likely from his diabetes.  He never received oxaliplatin based chemotherapy.  4.  Hypertension: He will continue valsartan HCTZ 160-45m daily.      Orders placed this encounter:  Orders Placed This Encounter  Procedures  . CBC with Differential/Platelet  . Comprehensive metabolic panel  . CEA  . CBC with Differential/Platelet  . Comprehensive metabolic  panel      SDerek Jack MD [phone]

## 2018-02-27 NOTE — Progress Notes (Signed)
Chemotherapy teaching pulled together. 

## 2018-02-27 NOTE — Assessment & Plan Note (Signed)
1.  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  He tolerated it very well.  CT CAP showed stable left upper lobe subcentimeter lung nodule, segment 4 lesion in the liver has decreased to 2.4 cm.  -He was evaluated by Dr. Anselm Pancoast, who thought microwave thermal ablation would be the best procedure for him.  He would like to save  Y 90 for later recurrences.  An MRI of the liver dated 11/21/2017 showed left hepatic lobe lesion measuring 2.6 x 2.1 cm.  No other lesions were seen. -He underwent left hepatic lobe microwave ablation on 12/10/2017.  He did very well with minimal pain and complications.  He is able to do all his day-to-day activities without any problems. - Last CEA of went up to 19.9.  He is completely asymptomatic. -CT scan of the abdomen pelvis on 02/05/2018 showed left hepatic lesion with central high attenuation and a large area of low-attenuation.  There is a new left hepatic lobe lesion measuring 1.3 cm.  CT chest showed similar appearing small nodules. - MRI of the liver dated 02/25/2018 shows heterogeneous mass which surrounds the right side of the ablation defect and extends inferiorly in segment 4B measuring 6.9 x 5.6 cm, segment 2 lesion measuring 2.6 cm, segment 8 subcapsular 1.6 cm lesion.  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 - He has at least 3 new lesions and questionable capsular meta stasis.  I do not believe he is a candidate for local therapy.  I have recommended going back on chemotherapy.  His last chemo was in July 2019, dose reduced FOLFIRI. - We discussed about side effects in detail.  I believe he had bleeding when he was put on bevacizumab previously.  He is not a candidate for EGFR antibody as K-ras mutation was positive.  We will tentatively start his chemotherapy next week.  I will see him back in 2 weeks for  follow-up.  2.  Diabetes: He is continuing metformin 500 mg daily and glipizide once daily.  He is also continuing Lipitor.  3.  Peripheral neuropathy: He has mild numbness in the feet, predominantly at bedtime.  This is likely from his diabetes.  He never received oxaliplatin based chemotherapy.  4.  Hypertension: He will continue valsartan HCTZ 160-63m daily.

## 2018-02-27 NOTE — Patient Instructions (Signed)
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 We are going to treat you with palliative intent. This means that your cancer is treatable but not curable.  We are going to be giving you the same chemotherapy that you had before. It is called the [facility] regimen. You will receive Fluorouracil (5Fu, Adrucil), Leucovorin, and Irinotecan (Camptosar).  The regimen will require you to take home and infusion pump and get continuous chemotherapy.  You will return to have the pump removed.  You will see the doctor regularly throughout treatment.  We monitor your lab work prior to every treatment. The doctor monitors your response to treatment by the way you are feeling, your blood work, and scans periodically.  There will be wait times while you are here for treatment.  It will take about 30 minutes to 1 hour for your lab work to result.  Then there will be wait times while pharmacy mixes your medications.   You will have the following premedications prior to treatment:  Aloxi- nausea medication Dexamethasone- high powered steroid Atropine- medication to prevent or decrease diarrhea induced by chemotherapy   5-Fluorouracil (Adrucil)  About This Drug Fluorouracil is used to treat cancer. It is given in the vein (IV).  Possible Side Effects . Bone marrow suppression. This is a decrease in the number of white blood cells, red blood cells, and platelets. This may raise your risk of infection, make you tired and weak (fatigue), and raise your risk of bleeding . Changes in the tissue of the heart and/or heart attack. Some changes may happen that can cause your heart to have less ability to pump blood. . Blurred vision or other changes in eyesight . Nausea and throwing up (vomiting) . Diarrhea (loose bowel movements) . Ulcers - sores that may cause pain or bleeding in your digestive tract, which includes your mouth, esophagus, stomach, small/large  intestines and rectum . Soreness of the mouth and throat. You may have red areas, white patches, or sores that hurt. . Allergic reactions, including anaphylaxis are rare but may happen in some patients. Signs of allergic reaction to this drug may be swelling of the face, feeling like your tongue or throat are swelling, trouble breathing, rash, itching, fever, chills, feeling dizzy, and/or feeling that your heart is beating in a fast or not normal way. If this happens, do not take another dose of this drug. You should get urgent medical treatment. . Sensitivity to light (photosensitivity). Photosensitivity means that you may become more sensitive to the sun and/or light. You may get a skin rash/reaction if you are in the sun or are exposed to sun lamps and tanning beds. Your eyes may water more, mostly in bright light. . Changes in your nail color, nail loss and/or brittle nail . Darkening of the skin, or changes to the color of your skin and/or veins used for infusion . Rash, dry skin, or itching Note: Not all possible side effects are included above.  Warnings and Precautions . Hand-and-foot syndrome. The palms of your hands or soles of your feet may tingle, become numb, painful, swollen, or red. . Changes in your central nervous system can happen. The central nervous system is made up of your brain and spinal cord. You could feel extreme tiredness, agitation, confusion, hallucinations (see or hear things that are not there), trouble understanding or speaking, loss of control of your bowels or bladder, eyesight changes, numbness or lack of strength to  your arms, legs, face, or body, or coma. If you start to have any of these symptoms let your doctor know right away. . Side effects of this drug may be unexpectedly severe in some patients Note: Some of the side effects above are very rare. If you have concerns and/or questions, please discuss them with your medical team.  Important  Information . This drug may be present in the saliva, tears, sweat, urine, stool, vomit, semen, and vaginal secretions. Talk to your doctor and/or your nurse about the necessary precautions to take during this time.  Treating Side Effects . Manage tiredness by pacing your activities for the day. . Be sure to include periods of rest between energy-draining activities. . To help decrease the risk of infections, wash your hands regularly. . Avoid close contact with people who have a cold, the flu, or other infections. . Take your temperature as your doctor or nurse tells you, and whenever you feel like you may have a fever. . Use a soft toothbrush. Check with your nurse before using dental floss. . Be very careful when using knives or tools. . Use an electric shaver instead of a razor. . If you have a nose bleed, sit with your head tipped slightly forward. Apply pressure by lightly pinching the bridge of your nose between your thumb and forefinger. Call your doctor if you feel dizzy or faint or if the bleeding doesn't stop after 10 to 15 minutes. . Drink plenty of fluids (a minimum of eight glasses per day is recommended). . If you throw up or have loose bowel movements, you should drink more fluids so that you do not become dehydrated (lack of water in the body from losing too much fluid). . To help with nausea and vomiting, eat small, frequent meals instead of three large meals a day. Choose foods and drinks that are at room temperature. Ask your nurse or doctor about other helpful tips and medicine that is available to help, stop, or lessen these symptoms. . If you have diarrhea, eat low-fiber foods that are high in protein and calories and avoid foods that can irritate your digestive tracts or lead to cramping. . Ask your nurse or doctor about medicine that can lessen or stop your diarrhea. . Mouth care is very important. Your mouth care should consist of routine, gentle cleaning of  your teeth or dentures and rinsing your mouth with a mixture of 1/2 teaspoon of salt in 8 ounces of water or 1/2 teaspoon of baking soda in 8 ounces of water. This should be done at least after each meal and at bedtime. . If you have mouth sores, avoid mouthwash that has alcohol. Also avoid alcohol and smoking because they can bother your mouth and throat. Marland Kitchen Keeping your nails moisturized may help with brittleness. . To help with itching, moisturize your skin several times day. . Use sunscreen with SPF 30 or higher when you are outdoors even for a short time. Cover up when you are out in the sun. Wear wide-brimmed hats, long-sleeved shirts, and pants. Keep your neck, chest, and back covered. Wear dark sun glasses when in the sun or bright lights. . If you get a rash do not put anything on it unless your doctor or nurse says you may. Keep the area around the rash clean and dry. Ask your doctor for medicine if your rash bothers you. Marland Kitchen Keeping your pain under control is important to your well-being. Please tell your doctor or nurse  if you are experiencing pain.  Food and Drug Interactions . There are no known interactions of fluorouracil with food. . Check with your doctor or pharmacist about all other prescription medicines and over-the-counter medicines and dietary supplements (vitamins, minerals, herbs and others) you are taking before starting this medicine as there are known drug interactions with 5-fluoroucacil. Also, check with your doctor or pharmacist before starting any new prescription or over-the-counter medicines, or dietary supplements to make sure that there are no interactions.  When to Call the Doctor Call your doctor or nurse if you have any of these symptoms and/or any new or unusual symptoms: . Fever of 100.4 F (38 C) or higher . Chills . Easy bleeding or bruising . Nose bleed that doesn't stop bleeding after 10-15 minutes . Trouble breathing . Feeling dizzy or  lightheaded . Feeling that your heart is beating in a fast or not normal way (palpitations) . Chest pain or symptoms of a heart attack. Most heart attacks involve pain in the center of the chest that lasts more than a few minutes. The pain may go away and come back or it can be constant. It can feel like pressure, squeezing, fullness, or pain. Sometimes pain is felt in one or both arms, the back, neck, jaw, or stomach. If any of these symptoms last 2 minutes, call 911. Marland Kitchen Confusion and/or agitation . Hallucinations . Trouble understanding or speaking . Loss of control of bowels or bladder . Blurry vision or changes in your eyesight . Headache that does not go away . Numbness or lack of strength to your arms, legs, face, or body . Nausea that stops you from eating or drinking and/or is not relieved by prescribed medicines . Throwing up more than 3 times a day . Diarrhea, 4 times in one day or diarrhea with lack of strength or a feeling of being dizzy . Pain in your mouth or throat that makes it hard to eat or drink . Pain along the digestive tract - especially if worse after eating . Blood in your vomit (bright red or coffee-ground) and/or stools (bright red, or black/tarry) . Coughing up blood . Tiredness that interferes with your daily activities . Painful, red, or swollen areas on your hands or feet or around your nails . A new rash or a rash that is not relieved by prescribed medicines . Develop sensitivity to sunlight/light . Numbness and/or tingling of your hands and/or feet . Signs of allergic reaction: swelling of the face, feeling like your tongue or throat are swelling, trouble breathing, rash, itching, fever, chills, feeling dizzy, and/or feeling that your heart is beating in a fast or not normal way. If this happens, call 911 for emergency care. . If you think you are pregnant or may have impregnated your partner  Reproduction Warnings . Pregnancy warning: This drug may have  harmful effects on the unborn baby. Women of child bearing potential should use effective methods of birth control during your cancer treatment and 3 months after treatment. Men with male partners of childbearing potential should use effective methods of birth control during your cancer treatment and for 3 months after your cancer treatment. Let your doctor know right away if you think you may be pregnant or may have impregnated your partner. . Breastfeeding warning: It is not known if this drug passes into breast milk. For this reason, Women should not breastfeed during treatment because this drug could enter the breast milk and cause harm to a breastfeeding  baby. . Fertility warning: In men and women both, this drug may affect your ability to have children in the future. Talk with your doctor or nurse if you plan to have children. Ask for information on sperm or egg banking.  Irinotecan (Camptosar)  About This Drug Irinotecan is used to treat cancer. It is given in the vein (IV).  Possible Side Effects . Bone marrow depression. This is a decrease in the number of white blood cells, red blood cells, and platelets. This may raise your risk of infection, make you tired and weak (fatigue), and raise your risk of bleeding. . Weakness . Fever . Infection . Hair loss. Hair loss is often temporary, although with certain medicine, hair loss can sometimes be permanent. Hair loss may happen suddenly or gradually. If you lose hair, you may lose it from your head, face, armpits, pubic area, chest, and/or legs. You may also notice your hair getting thin. . Soreness of the mouth and throat. You may have red areas, white patches, or sores that hurt . Nausea and throwing up (vomiting) . Pain in your abdomen . Loose bowel movements (diarrhea) . Constipation (unable to move bowels) . Decreased appetite (decreased hunger) . Weight loss . Changes in your liver function . Pain Note: Each of the  side effects above was reported in 30% or greater of patients treated with irinotecan. Not all possible side effects are included above.  Warnings and Precautions . Severe diarrhea and colitis which is swelling (inflammation) in the colon - symptoms are loose bowel movements (diarrhea) stomach cramping, and sometimes blood in the bowel movements. Very rarely, a hole in your stomach, small and/or large intestine can happen. . Severe bone marrow depression which can be life-threatening . Allergic reactions, including anaphylaxis are rare but may happen in some patients. Signs of allergic reaction to this drug may be swelling of the face, feeling like your tongue or throat are swelling, trouble breathing, rash, itching, fever, chills, feeling dizzy, and/or feeling that your heart is beating in a fast or not normal way. If this happens, do not take another dose of this drug. You should get urgent medical treatment. . Changes in your kidney function which can cause kidney failure and be life-threatening . Inflammation (swelling) of the lungs, which can be life-threatening. You may have a dry cough or trouble breathing. Marland Kitchen Blurred vision or other changes in eyesight as well as feeling dizzy can happen within 24 hours of receiving this medicine. Note: Some of the side effects above are very rare. If you have concerns and/or questions, please discuss them with your medical team.  Important Information . This drug may be present in the saliva, tears, sweat, urine, stool, vomit, semen, and vaginal secretions. Talk to your doctor and/or your nurse about the necessary precautions to take during this time. . It is important that you notify your doctor and/or nurse at the first sign of loose bowel movements (diarrhea) so they can provide you with anti-diarrhea medication and give you further instructions. Notify your doctor and/or nurse if you are taking anti-diarrhea medication and your symptoms  have not improved, or are worsening.  Treating Side Effects . Manage tiredness by pacing your activities for the day. . Be sure to include periods of rest between energy-draining activities. . To decrease infection, wash your hands regularly . Avoid close contact with people who have a cold, the flu, or other infections. . Take your temperature as your doctor or nurse tells you, and  whenever you feel like you may have a fever . To help decrease bleeding, use a soft toothbrush. Check with your nurse before using dental floss. . Be very careful when using knives or tools . Use an electric shaver instead of a razor . To help with hair loss, wash with a mild shampoo and avoid washing your hair everyday . Avoid rubbing your scalp, pat your hair or scalp dry . Avoid coloring your hair . Limit your use of hair spray, electric curlers, blow dryers, and curling irons. . If you are interested in getting a wig, talk to your nurse. You can also call the [facility] at [phone] to find out information about the "Look Good, Feel Better" program close to where you live. It is a free program where women getting chemotherapy can learn about wigs, turbans and scarves as well as makeup techniques and skin and nail care. . Mouth care is very important. Your mouth care should consist of routine, gentle cleaning of your teeth or dentures and rinsing your mouth with a mixture of 1/2 teaspoon of salt in 8 ounces of water or  teaspoon of baking soda in 8 ounces of water. This should be done at least after each meal and at bedtime. . If you have mouth sores, avoid mouthwash that has alcohol. Also avoid alcohol and smoking because they can bother your mouth and throat. . Ask your doctor or nurse about medicines that are available to help stop or lessen constipation. . If you are not able to move your bowels, check with your doctor or nurse before you use enemas, laxatives, or suppositories .  Drink plenty of fluids (a minimum of eight glasses per day is recommended). . If you throw up or have loose bowel movements, you should drink more fluids so that you do not become dehydrated (lack water in the body from losing too much fluid). . If you get diarrhea, eat low-fiber foods that are high in protein and calories and avoid foods that can irritate your digestive tracts or lead to cramping. . Ask your nurse or doctor about medicine that can lessen or stop your diarrhea. . To help with nausea and vomiting, eat small, frequent meals instead of three large meals a day. Choose foods and drinks that are at room temperature. Ask your nurse or doctor about other helpful tips and medicine that is available to help or stop lessen these symptoms. . To help with decreased appetite, eat small, frequent meals . Eat high caloric food such as pudding, ice cream, yogurt and milkshakes. Marland Kitchen Keeping your pain under control is important to your well-being. Please tell your doctor or nurse if you are experiencing pain.  Food and Drug Interactions . This drug may interact with grapefruit and grapefruit juice. Talk to your doctor as this could make side effects worse. . This drug may interact with other medicines. Tell your doctor and pharmacist about all the prescription and over-the-counter medicines and dietary supplements (vitamins, minerals, herbs and others) that you are taking at this time. The safety and use of dietary supplements and alternative diets are often not known. Using these might affect your cancer or interfere with your treatment. Until more is known, you should not use dietary supplements or alternative diets without your cancer doctor's help. Marland Kitchen Avoid the use of St. John's Wort while taking irinotecan as this may lower the levels of the drug in your body, which can make it less effective.  When to Call  the Doctor Call your doctor or nurse if you have any of these symptoms and/or any  new or unusual symptoms: . Fever of 100.4 F (38 C) or higher . Chills . Pain in your chest . Dry cough . Trouble breathing . Feeling dizzy or lightheaded . Easy bleeding or bruising . Pain in your mouth or throat that makes it hard to eat or drink . No bowel movement in 3 days or when you feel uncomfortable . Loose bowel movements (diarrhea) 4 times a day or loose bowel movements with lack of strength or a feeling of being dizzy . Blood in your stool . Severe Abdominal pain that does not go away . Nausea that stops you from eating or drinking and/or is not relieved by prescribed medicines . Throwing up more than 3 times a day . Lasting loss of appetite or rapid weight loss of five pounds in a week . Fatigue that interferes with your daily activities . Decreased urine . Signs of allergic reaction: swelling of the face, feeling like your tongue or throat are swelling, trouble breathing, rash, itching, fever, chills, feeling dizzy, and/or feeling that your heart is beating in a fast or not normal way . If you think you may be pregnant  Reproduction Warnings . Pregnancy warning: This drug can have harmful effects on the unborn baby. Women of child bearing potential should use effective methods of birth control during your cancer treatment. Let your doctor know right away if you think you may be pregnant. . Breastfeeding warning: It is not known if this drug passes into breast milk. For this reason, women should talk to their doctor about the risks and benefits of breast feeding during treatment with this drug because this drug may enter the breast milk and cause harm to a breast feeding baby. . Fertility warning: Human fertility studies have not been done with this drug. Talk with your doctor or nurse if you plan to have children. Ask for information on sperm or egg banking.  Leucovorin Calcium  About This Drug Leucovorin is a vitamin. It is used in combination with other cancer  fighting drugs such as 5-fluorouracil and methotrexate. Leucovorin is given in the vein (IV) or by mouth (orally).  Possible Side Effects . Rash and itching Note: Leucovorin by itself has very few side effects. Other side effects you may have can be caused by the other drugs you are taking, such as 5-fluorouracil or methotrexate.  Warnings and Precautions . Allergic reactions, including anaphylaxis are rare but may happen in some patients. Signs of allergic reaction to this drug may be swelling of the face, feeling like your tongue or throat are swelling, trouble breathing, rash, itching, fever, chills, feeling dizzy, and/or feeling that your heart is beating in a fast or not normal way. If this happens, do not take another dose of this drug. You should get urgent medical treatment.  How to Take Your Medication . Swallow the medicine with or without food. . Missed dose: If you vomit or miss a dose, contact your physician immediately for further instructions. . Storage: Store this medicine in the original container at room temperature. Protect from light. Discuss with your nurse or your doctor how to dispose of unused medicine.  Treating Side Effects . If you get a rash do not put anything on it unless your doctor or nurse says you may. Keep the area around the rash clean and dry. Ask your doctor for medicine if your rash bothers you.  Food and Drug Interactions . There are no known interactions of leucovorin with food. . This drug may interact with other medicines. Tell your doctor and pharmacist about all the prescription and over-the-counter medicines and dietary supplements (vitamins, minerals, herbs and others) that you are taking at this time. The safety and use of dietary supplements and alternative diets are often not known. Using these might affect your cancer or interfere with your treatment. Until more is known, you should not use dietary supplements or alternative diets  without your cancer doctor's help.  When to Call the Doctor Call your doctor or nurse if you have any of these symptoms and/or any new or unusual symptoms: . A new rash or a rash that is not relieved by prescribed medicines . Signs of allergic reaction: swelling of the face, feeling like your tongue or throat are swelling, trouble breathing, rash, itching, fever, chills, feeling dizzy, and/or feeling that your heart is beating in a fast or not normal way . If you think you may be pregnant  Reproduction Warnings . Pregnancy warning: It is not known if this drug may harm an unborn child. For this reason, be sure to talk with your doctor if you are pregnant or planning to become pregnant while receiving this drug. Let your doctor know right away if you think you may be pregnant . Breastfeeding warning: It is not known if this drug passes into breast milk. For this reason, women should talk to their doctor about the risks and benefits of breastfeeding during treatment with this drug because this drug may enter the breast milk and cause harm to a breastfeeding baby. . Fertility warning: Human fertility studies have not been done with this drug. Talk with your doctor or nurse if you plan to have children. Ask for information on sperm or egg banking.  SELF CARE ACTIVITIES WHILE ON CHEMOTHERAPY:  Hydration Increase your fluid intake 48 hours prior to treatment and drink at least 8 to 12 cups (64 ounces) of water/decaffeinated beverages per day after treatment. You can still have your cup of coffee or soda but these beverages do not count as part of your 8 to 12 cups that you need to drink daily. No alcohol intake.  Medications Continue taking your normal prescription medication as prescribed.  If you start any new herbal or new supplements please let us know first to make sure it is safe.  Mouth Care Have teeth cleaned professionally before starting treatment. Keep dentures and partial plates  clean. Use soft toothbrush and do not use mouthwashes that contain alcohol. Biotene is a good mouthwash that is available at most pharmacies or may be ordered by calling [phone]. Use warm salt water gargles (1 teaspoon salt per 1 quart warm water) before and after meals and at bedtime. Or you may rinse with 2 tablespoons of three-percent hydrogen peroxide mixed in eight ounces of water. If you are still having problems with your mouth or sores in your mouth please call the clinic. If you need dental work, please let the doctor know before you go for your appointment so that we can coordinate the best possible time for you in regards to your chemo regimen. You need to also let your dentist know that you are actively taking chemo. We may need to do labs prior to your dental appointment.  Skin Care Always use sunscreen that has not expired and with SPF (Sun Protection Factor) of 50 or higher. Wear hats to protect your head  from the sun. Remember to use sunscreen on your hands, ears, face, & feet.  Use good moisturizing lotions such as udder cream, eucerin, or even Vaseline. Some chemotherapies can cause dry skin, color changes in your skin and nails.    . Avoid long, hot showers or baths. . Use gentle, fragrance-free soaps and laundry detergent. . Use moisturizers, preferably creams or ointments rather than lotions because the thicker consistency is better at preventing skin dehydration. Apply the cream or ointment within 15 minutes of showering. Reapply moisturizer at night, and moisturize your hands every time after you wash them.  Hair Loss (if your doctor says your hair will fall out)  . If your doctor says that your hair is likely to fall out, decide before you begin chemo whether you want to wear a wig. You may want to shop before treatment to match your hair color. . Hats, turbans, and scarves can also camouflage hair loss, although some people prefer to leave their heads uncovered. If you go  bare-headed outdoors, be sure to use sunscreen on your scalp. . Cut your hair short. It eases the inconvenience of shedding lots of hair, but it also can reduce the emotional impact of watching your hair fall out. . Don't perm or color your hair during chemotherapy. Those chemical treatments are already damaging to hair and can enhance hair loss. Once your chemo treatments are done and your hair has grown back, it's OK to resume dyeing or perming hair. With chemotherapy, hair loss is almost always temporary. But when it grows back, it may be a different color or texture. In older adults who still had hair color before chemotherapy, the new growth may be completely gray.  Often, new hair is very fine and soft.  Infection Prevention Please wash your hands for at least 30 seconds using warm soapy water. Handwashing is the #1 way to prevent the spread of germs. Stay away from sick people or people who are getting over a cold. If you develop respiratory systems such as green/yellow mucus production or productive cough or persistent cough let us know and we will see if you need an antibiotic. It is a good idea to keep a pair of gloves on when going into grocery stores/[employer] to decrease your risk of coming into contact with germs on the carts, etc. Carry alcohol hand gel with you at all times and use it frequently if out in public. If your temperature reaches 100.5 or higher please call the clinic and let us know.  If it is after hours or on the weekend please go to the ER if your temperature is over 100.5.  Please have your own personal thermometer at home to use.    Sex and bodily fluids If you are going to have sex, a condom must be used to protect the person that isn't taking chemotherapy. Chemo can decrease your libido (sex drive). For a few days after chemotherapy, chemotherapy can be excreted through your bodily fluids.  When using the toilet please close the lid and flush the toilet twice.  Do this for a  few day after you have had chemotherapy.   Effects of chemotherapy on your sex life Some changes are simple and won't last long. They won't affect your sex life permanently. Sometimes you may feel: . too tired . not strong enough to be very active . sick or sore  . not in the mood . anxious or low Your anxiety might not seem related to  sex. For example, you may be worried about the cancer and how your treatment is going. Or you may be worried about money, or about how you family are coping with your illness. These things can cause stress, which can affect your interest in sex. It's important to talk to your partner about how you feel. Remember - the changes to your sex life don't usually last long. There's usually no medical reason to stop having sex during chemo. The drugs won't have any long term physical effects on your performance or enjoyment of sex. Cancer can't be passed on to your partner during sex  Contraception It's important to use reliable contraception during treatment. Avoid getting pregnant while you or your partner are having chemotherapy. This is because the drugs may harm the baby. Sometimes chemotherapy drugs can leave a man or woman infertile.  This means you would not be able to have children in the future. You might want to talk to someone about permanent infertility. It can be very difficult to learn that you may no longer be able to have children. Some people find counselling helpful. There might be ways to preserve your fertility, although this is easier for men than for women. You may want to speak to a fertility expert. You can talk about sperm banking or harvesting your eggs. You can also ask about other fertility options, such as donor eggs. If you have or have had breast cancer, your doctor might advise you not to take the contraceptive pill. This is because the hormones in it might affect the cancer.  It is not known for sure whether or not chemotherapy drugs can be  passed on through semen or secretions from the vagina. Because of this some doctors advise people to use a barrier method if you have sex during treatment. This applies to vaginal, anal or oral sex. Generally, doctors advise a barrier method only for the time you are actually having the treatment and for about a week after your treatment. Advice like this can be worrying, but this does not mean that you have to avoid being intimate with your partner. You can still have close contact with your partner and continue to enjoy sex.  Animals If you have cats or birds we just ask that you not change the litter or change the cage.  Please have someone else do this for you while you are on chemotherapy.   Food Safety During and After Cancer Treatment Food safety is important for people both during and after cancer treatment. Cancer and cancer treatments, such as chemotherapy, radiation therapy, and stem cell/bone marrow transplantation, often weaken the immune system. This makes it harder for your body to protect itself from foodborne illness, also called food poisoning. Foodborne illness is caused by eating food that contains harmful bacteria, parasites, or viruses.  Foods to avoid Some foods have a higher risk of becoming tainted with bacteria. These include: Marland Kitchen Unwashed fresh fruit and vegetables, especially leafy vegetables that can hide dirt and other contaminants . Raw sprouts, such as alfalfa sprouts . Raw or undercooked beef, especially ground beef, or other raw or undercooked meat and poultry . Fatty, fried, or spicy foods immediately before or after treatment.  These can sit heavy on your stomach and make you feel nauseous. . Raw or undercooked shellfish, such as oysters. . Sushi and sashimi, which often contain raw fish.  . Unpasteurized beverages, such as unpasteurized fruit juices, raw milk, raw yogurt, or cider . Undercooked eggs, such  as soft boiled, over easy, and poached; raw, unpasteurized  eggs; or foods made with raw egg, such as homemade raw cookie dough and homemade mayonnaise Simple steps for food safety Shop smart. . Do not buy food stored or displayed in an unclean area. . Do not buy bruised or damaged fruits or vegetables. . Do not buy cans that have cracks, dents, or bulges. . Pick up foods that can spoil at the end of your shopping trip and store them in a cooler on the way home. Prepare and clean up foods carefully. . Rinse all fresh fruits and vegetables under running water, and dry them with a clean towel or paper towel. . Clean the top of cans before opening them. . After preparing food, wash your hands for 20 seconds with hot water and soap. Pay special attention to areas between fingers and under nails. . Clean your utensils and dishes with hot water and soap. Marland Kitchen Disinfect your kitchen and cutting boards using 1 teaspoon of liquid, unscented bleach mixed into 1 quart of water.   Dispose of old food. . Eat canned and packaged food before its expiration date (the "use by" or "best before" date). . Consume refrigerated leftovers within 3 to 4 days. After that time, throw out the food. Even if the food does not smell or look spoiled, it still may be unsafe. Some bacteria, such as Listeria, can grow even on foods stored in the refrigerator if they are kept for too long. Take precautions when eating out. . At restaurants, avoid buffets and salad bars where food sits out for a long time and comes in contact with many people. Food can become contaminated when someone with a virus, often a norovirus, or another "bug" handles it. . Put any leftover food in a "to-go" container yourself, rather than having the server do it. And, refrigerate leftovers as soon as you get home. . Choose restaurants that are clean and that are willing to prepare your food as you order it cooked.   MEDICATIONS:                                                                                                                                                                 Compazine/Prochlorperazine 10mg  tablet. Take 1 tablet every 6 hours as needed for nausea/vomiting. (This can make you sleepy)   EMLA cream. Apply a quarter size amount to port site 1 hour prior to chemo. Do not rub in. Cover with plastic wrap.   Over-the-Counter Meds:  Colace - 100 mg capsules - take 2 capsules daily.  If this doesn't help then you can increase to 2 capsules twice daily.  Call us if this does not help your bowels move.   Imodium 2mg  capsule. Take 2 capsules  after the 1st loose stool and then 1 capsule every 2 hours until you go a total of 12 hours without having a loose stool. Call the [facility] if loose stools continue. If diarrhea occurs at bedtime, take 2 capsules at bedtime. Then take 2 capsules every 4 hours until morning. Call [facility].    Diarrhea Sheet   If you are having loose stools/diarrhea, please purchase Imodium and begin taking as outlined:  At the first sign of poorly formed or loose stools you should begin taking Imodium (loperamide) 2 mg capsules.  Take two caplets (4mg ) followed by one caplet (2mg ) every 2 hours until you have had no diarrhea for 12 hours.  During the night take two caplets (4mg ) at bedtime and continue every 4 hours during the night until the morning.  Stop taking Imodium only after there is no sign of diarrhea for 12 hours.    Always call the [facility] if you are having loose stools/diarrhea that you can't get under control.  Loose stools/diarrhea leads to dehydration (loss of water) in your body.  We have other options of trying to get the loose stools/diarrhea to stop but you must let us know!   Constipation Sheet  Colace - 100 mg capsules - take 2 capsules daily.  If this doesn't help then you can increase to 2 capsules twice daily.  Please call if the above does not work for you.   Do not go more than 2 days without a bowel movement.  It is very  important that you do not become constipated.  It will make you feel sick to your stomach (nausea) and can cause abdominal pain and vomiting.   Nausea Sheet   Compazine/Prochlorperazine 10mg  tablet. Take 1 tablet every 6 hours as needed for nausea/vomiting. (This can make you sleepy)  If you are having persistent nausea (nausea that does not stop) please call the [facility] and let us know the amount of nausea that you are experiencing.  If you begin to vomit, you need to call the [facility] and if it is the weekend and you have vomited more than one time and can't get it to stop-go to the Emergency Room.  Persistent nausea/vomiting can lead to dehydration (loss of fluid in your body) and will make you feel terrible.   Ice chips, sips of clear liquids, foods that are @ room temperature, crackers, and toast tend to be better tolerated.   SYMPTOMS TO REPORT AS SOON AS POSSIBLE AFTER TREATMENT:   FEVER GREATER THAN 100.5 F  CHILLS WITH OR WITHOUT FEVER  NAUSEA AND VOMITING THAT IS NOT CONTROLLED WITH YOUR NAUSEA MEDICATION  UNUSUAL SHORTNESS OF BREATH  UNUSUAL BRUISING OR BLEEDING  TENDERNESS IN MOUTH AND THROAT WITH OR WITHOUT PRESENCE OF ULCERS  URINARY PROBLEMS  BOWEL PROBLEMS  UNUSUAL RASH      Wear comfortable clothing and clothing appropriate for easy access to any Portacath or PICC line. Let us know if there is anything that we can do to make your therapy better!    What to do if you need assistance after hours or on the weekends: CALL [phone].  HOLD on the line, do not hang up.  You will hear multiple messages but at the end you will be connected with a nurse triage line.  They will contact the doctor if necessary.  Most of the time they will be able to assist you.  Do not call the hospital operator.  I have been informed and understand all of the instructions given to me and have received a copy. I have been instructed to call the clinic [phone] or my family physician as soon as possible for continued medical care, if indicated. I do not have any more questions at this time but understand that I may call the [facility] or the Patient Navigator at [phone] during office hours should I have questions or need assistance in obtaining follow-up care.

## 2018-02-27 NOTE — Patient Instructions (Signed)
[facility] at [facility] Discharge Instructions     Thank you for choosing [facility] at [facility] to provide your oncology and hematology care.  To afford each patient quality time with our provider, please arrive at least 15 minutes before your scheduled appointment time.   If you have a lab appointment with the Cancer Center please come in thru the  Main Entrance and check in at the main information desk  You need to re-schedule your appointment should you arrive 10 or more minutes late.  We strive to give you quality time with our providers, and arriving late affects you and other patients whose appointments are after yours.  Also, if you no show three or more times for appointments you may be dismissed from the clinic at the providers discretion.     Again, thank you for choosing [facility].  Our hope is that these requests will decrease the amount of time that you wait before being seen by our physicians.       _____________________________________________________________  Should you have questions after your visit to [facility], please contact our office at [phone] between the hours of 8:00 a.m. and 4:30 p.m.  Voicemails left after 4:00 p.m. will not be returned until the following business day.  For prescription refill requests, have your pharmacy contact our office and allow 72 hours.    Cancer Center Support Programs:   > Cancer Support Group  2nd Tuesday of the month 1pm-2pm, Journey Room    

## 2018-02-27 NOTE — Addendum Note (Signed)
Addended by: Donetta Potts on: 02/27/2018 02:57 PM   Modules accepted: Orders

## 2018-02-27 NOTE — Progress Notes (Signed)
Patient here today for office visit.  Will be starting Folfiri on Monday. Patient provided with [email or web address] information on regimen. Explained to patient that he will receive an extensive teaching packet when he comes for his first infusion.

## 2018-03-02 ENCOUNTER — Inpatient Hospital Stay (HOSPITAL_COMMUNITY): Payer: Medicare PPO

## 2018-03-02 VITALS — BP 132/86 | HR 18 | Temp 98.0°F | Resp 16 | Wt 222.8 lb

## 2018-03-02 DIAGNOSIS — Z23 Encounter for immunization: Secondary | ICD-10-CM | POA: Diagnosis not present

## 2018-03-02 DIAGNOSIS — Z5111 Encounter for antineoplastic chemotherapy: Secondary | ICD-10-CM | POA: Diagnosis not present

## 2018-03-02 DIAGNOSIS — K769 Liver disease, unspecified: Secondary | ICD-10-CM | POA: Diagnosis not present

## 2018-03-02 DIAGNOSIS — C189 Malignant neoplasm of colon, unspecified: Secondary | ICD-10-CM

## 2018-03-02 DIAGNOSIS — C7800 Secondary malignant neoplasm of unspecified lung: Secondary | ICD-10-CM | POA: Diagnosis not present

## 2018-03-02 DIAGNOSIS — Z452 Encounter for adjustment and management of vascular access device: Secondary | ICD-10-CM | POA: Diagnosis not present

## 2018-03-02 DIAGNOSIS — C787 Secondary malignant neoplasm of liver and intrahepatic bile duct: Principal | ICD-10-CM

## 2018-03-02 DIAGNOSIS — I10 Essential (primary) hypertension: Secondary | ICD-10-CM | POA: Diagnosis not present

## 2018-03-02 DIAGNOSIS — E1142 Type 2 diabetes mellitus with diabetic polyneuropathy: Secondary | ICD-10-CM | POA: Diagnosis not present

## 2018-03-02 LAB — COMPREHENSIVE METABOLIC PANEL
ALBUMIN: 4.4 g/dL (ref 3.5–5.0)
ALT: 20 U/L (ref 0–44)
AST: 24 U/L (ref 15–41)
Alkaline Phosphatase: 124 U/L (ref 38–126)
Anion gap: 10 (ref 5–15)
BUN: 17 mg/dL (ref 8–23)
CHLORIDE: 98 mmol/L (ref 98–111)
CO2: 26 mmol/L (ref 22–32)
CREATININE: 1.08 mg/dL (ref 0.61–1.24)
Calcium: 9.5 mg/dL (ref 8.9–10.3)
GFR calc Af Amer: >60 mL/min (ref >60)
Glucose, Bld: 245 mg/dL — ABNORMAL HIGH (ref 70–99)
Potassium: 4.5 mmol/L (ref 3.5–5.1)
SODIUM: 134 mmol/L — AB (ref 135–145)
Total Bilirubin: 0.5 mg/dL (ref 0.3–1.2)
Total Protein: 7.8 g/dL (ref 6.5–8.1)

## 2018-03-02 LAB — CBC WITH DIFFERENTIAL/PLATELET
ABS IMMATURE GRANULOCYTES: 0.03 10*3/uL (ref 0.00–0.07)
BASOS ABS: 0.1 10*3/uL (ref 0.0–0.1)
BASOS PCT: 1 %
EOS ABS: 0.5 10*3/uL (ref 0.0–0.5)
Eosinophils Relative: 5 %
HCT: 41.7 % (ref 39.0–52.0)
Hemoglobin: 13.2 g/dL (ref 13.0–17.0)
IMMATURE GRANULOCYTES: 0 %
Lymphocytes Relative: 22 %
Lymphs Abs: 2.1 10*3/uL (ref 0.7–4.0)
MCH: 28.6 pg (ref 26.0–34.0)
MCHC: 31.7 g/dL (ref 30.0–36.0)
MCV: 90.5 fL (ref 80.0–100.0)
Monocytes Absolute: 0.7 10*3/uL (ref 0.1–1.0)
Monocytes Relative: 8 %
NEUTROS PCT: 64 %
NRBC: 0 % (ref 0.0–0.2)
Neutro Abs: 6.2 10*3/uL (ref 1.7–7.7)
PLATELETS: 215 10*3/uL (ref 150–400)
RBC: 4.61 MIL/uL (ref 4.22–5.81)
RDW: 13.9 % (ref 11.5–15.5)
WBC: 9.6 10*3/uL (ref 4.0–10.5)

## 2018-03-02 LAB — MAGNESIUM: MAGNESIUM: 1.9 mg/dL (ref 1.7–2.4)

## 2018-03-02 MED ORDER — SODIUM CHLORIDE 0.9 % IV SOLN
1920.0000 mg/m2 | INTRAVENOUS | Status: DC
  Administered 2018-03-02: 4250 mg via INTRAVENOUS
  Filled 2018-03-02: qty 85

## 2018-03-02 MED ORDER — SODIUM CHLORIDE 0.9 % IV SOLN
Freq: Once | INTRAVENOUS | Status: AC
  Administered 2018-03-02: 10:00:00 via INTRAVENOUS

## 2018-03-02 MED ORDER — LEUCOVORIN CALCIUM INJECTION 350 MG
900.0000 mg | Freq: Once | INTRAVENOUS | Status: DC
  Filled 2018-03-02: qty 45

## 2018-03-02 MED ORDER — PALONOSETRON HCL INJECTION 0.25 MG/5ML
0.2500 mg | Freq: Once | INTRAVENOUS | Status: AC
  Administered 2018-03-02: 0.25 mg via INTRAVENOUS
  Filled 2018-03-02: qty 5

## 2018-03-02 MED ORDER — SODIUM CHLORIDE 0.9 % IV SOLN
10.0000 mg | Freq: Once | INTRAVENOUS | Status: AC
  Administered 2018-03-02: 10 mg via INTRAVENOUS
  Filled 2018-03-02: qty 1

## 2018-03-02 MED ORDER — IRINOTECAN HCL CHEMO INJECTION 100 MG/5ML
144.0000 mg/m2 | Freq: Once | INTRAVENOUS | Status: AC
  Administered 2018-03-02: 320 mg via INTRAVENOUS
  Filled 2018-03-02: qty 16

## 2018-03-02 MED ORDER — SODIUM CHLORIDE 0.9 % IV SOLN
INTRAVENOUS | Status: DC
  Administered 2018-03-02: 10:00:00 via INTRAVENOUS

## 2018-03-02 MED ORDER — LEUCOVORIN CALCIUM INJECTION 350 MG
900.0000 mg | Freq: Once | INTRAVENOUS | Status: AC
  Administered 2018-03-02: 900 mg via INTRAVENOUS
  Filled 2018-03-02: qty 45

## 2018-03-02 MED ORDER — FLUOROURACIL CHEMO INJECTION 2.5 GM/50ML
320.0000 mg/m2 | Freq: Once | INTRAVENOUS | Status: AC
  Administered 2018-03-02: 700 mg via INTRAVENOUS
  Filled 2018-03-02: qty 14

## 2018-03-02 MED ORDER — ATROPINE SULFATE 1 MG/ML IJ SOLN
0.5000 mg | Freq: Once | INTRAMUSCULAR | Status: AC
  Administered 2018-03-02: 0.5 mg via INTRAVENOUS
  Filled 2018-03-02: qty 1

## 2018-03-02 MED ORDER — SODIUM CHLORIDE 0.9% FLUSH
10.0000 mL | INTRAVENOUS | Status: DC | PRN
Start: 2018-03-02 — End: 2018-03-02
  Administered 2018-03-02: 10 mL
  Filled 2018-03-02: qty 10

## 2018-03-02 NOTE — Patient Instructions (Signed)
[facility] Discharge Instructions for Patients Receiving Chemotherapy   Beginning January 23rd 2017 lab work for the Cancer Center will be done in the  Main lab at [facility] on 1st floor. If you have a lab appointment with the Cancer Center please come in thru the  Main Entrance and check in at the main information desk   Today you received the following chemotherapy agents Irinotecan, Leucovorin, and 5FU  To help prevent nausea and vomiting after your treatment, we encourage you to take your nausea medication    If you develop nausea and vomiting, or diarrhea that is not controlled by your medication, call the clinic.  The clinic phone number is [phone]. Office hours are Monday-Friday 8:30am-5:00pm.  BELOW ARE SYMPTOMS THAT SHOULD BE REPORTED IMMEDIATELY:  *FEVER GREATER THAN 101.0 F  *CHILLS WITH OR WITHOUT FEVER  NAUSEA AND VOMITING THAT IS NOT CONTROLLED WITH YOUR NAUSEA MEDICATION  *UNUSUAL SHORTNESS OF BREATH  *UNUSUAL BRUISING OR BLEEDING  TENDERNESS IN MOUTH AND THROAT WITH OR WITHOUT PRESENCE OF ULCERS  *URINARY PROBLEMS  *BOWEL PROBLEMS  UNUSUAL RASH Items with * indicate a potential emergency and should be followed up as soon as possible. If you have an emergency after office hours please contact your primary care physician or go to the nearest emergency department.  Please call the clinic during office hours if you have any questions or concerns.   You may also contact the Patient Navigator at [phone] should you have any questions or need assistance in obtaining follow up care.      Resources For Cancer Patients and their Caregivers ? American Cancer Society: Can assist with transportation, wigs, general needs, runs Look Good Feel Better.        1-888-227-6333 ? Cancer Care: Provides financial assistance, online support groups, medication/co-pay assistance.  1-800-813-HOPE (4673) ? [facility] Assists [place] cancer patients and their families through emotional , educational and financial support.  336-427-4357 ? [place] DSS Where to apply for food stamps, Medicaid and utility assistance. 336-342-1394 ? [facility]: Transportation to medical appointments. 336-347-2287 ? Social Security Administration: May apply for disability if have a Stage IV cancer. 336-342-7796 1-800-772-1213 ? [place] Aging, Disability and Transit Services: Assists with nutrition, care and transit needs. 336-349-2343          

## 2018-03-02 NOTE — Progress Notes (Signed)
7939 labs reviewed with Dr. Delton Coombes and pt approved for [facility] tx today. New consent obtained.   Christopher Friedman tolerated Folfiri without incident or complaint. VSS upon completion of treatment. 5FU infusing on home infusion pump through port without complications. Discharged self ambulatory in satisfactory condition.

## 2018-03-04 ENCOUNTER — Inpatient Hospital Stay (HOSPITAL_COMMUNITY): Payer: Medicare PPO

## 2018-03-04 VITALS — BP 130/85 | HR 82 | Temp 98.5°F | Resp 16

## 2018-03-04 DIAGNOSIS — C787 Secondary malignant neoplasm of liver and intrahepatic bile duct: Secondary | ICD-10-CM | POA: Diagnosis not present

## 2018-03-04 DIAGNOSIS — E1142 Type 2 diabetes mellitus with diabetic polyneuropathy: Secondary | ICD-10-CM | POA: Diagnosis not present

## 2018-03-04 DIAGNOSIS — K769 Liver disease, unspecified: Secondary | ICD-10-CM | POA: Diagnosis not present

## 2018-03-04 DIAGNOSIS — Z5111 Encounter for antineoplastic chemotherapy: Secondary | ICD-10-CM | POA: Diagnosis not present

## 2018-03-04 DIAGNOSIS — C189 Malignant neoplasm of colon, unspecified: Secondary | ICD-10-CM | POA: Diagnosis not present

## 2018-03-04 DIAGNOSIS — Z452 Encounter for adjustment and management of vascular access device: Secondary | ICD-10-CM | POA: Diagnosis not present

## 2018-03-04 DIAGNOSIS — Z23 Encounter for immunization: Secondary | ICD-10-CM | POA: Diagnosis not present

## 2018-03-04 DIAGNOSIS — I10 Essential (primary) hypertension: Secondary | ICD-10-CM | POA: Diagnosis not present

## 2018-03-04 DIAGNOSIS — C7800 Secondary malignant neoplasm of unspecified lung: Secondary | ICD-10-CM | POA: Diagnosis not present

## 2018-03-04 MED ORDER — HEPARIN SOD (PORK) LOCK FLUSH 100 UNIT/ML IV SOLN
500.0000 [IU] | Freq: Once | INTRAVENOUS | Status: AC | PRN
  Administered 2018-03-04: 500 [IU]

## 2018-03-04 MED ORDER — SODIUM CHLORIDE 0.9% FLUSH
10.0000 mL | INTRAVENOUS | Status: DC | PRN
  Administered 2018-03-04: 10 mL
  Filled 2018-03-04: qty 10

## 2018-03-04 NOTE — Patient Instructions (Signed)
[facility] at [facility] _______________________________________________________________  Thank you for choosing [facility] at [facility] to provide your oncology and hematology care.  To afford each patient quality time with our providers, please arrive at least 15 minutes before your scheduled appointment.  You need to re-schedule your appointment if you arrive 10 or more minutes late.  We strive to give you quality time with our providers, and arriving late affects you and other patients whose appointments are after yours.  Also, if you no show three or more times for appointments you may be dismissed from the clinic.  Again, thank you for choosing [facility] at [facility]. Our hope is that these requests will allow you access to exceptional care and in a timely manner. _______________________________________________________________  If you have questions after your visit, please contact our office at [phone] between the hours of 8:30 a.m. and 5:00 p.m. Voicemails left after 4:30 p.m. will not be returned until the following business day. _______________________________________________________________  For prescription refill requests, have your pharmacy contact our office. _______________________________________________________________  Recommendations made by the consultant and any test results will be sent to your referring physician. _______________________________________________________________ 

## 2018-03-04 NOTE — Progress Notes (Signed)
Christopher Friedman presents today for home infusion pump d/c and port flush. Pump d/c'd with all medication infused. Port flushed with saline and heparin and deaccessed. Pt discharged self ambulatory in satisfactory condition.

## 2018-03-16 ENCOUNTER — Inpatient Hospital Stay (HOSPITAL_COMMUNITY): Payer: Medicare PPO

## 2018-03-16 ENCOUNTER — Encounter (HOSPITAL_COMMUNITY): Payer: Self-pay | Admitting: Hematology

## 2018-03-16 ENCOUNTER — Other Ambulatory Visit: Payer: Self-pay

## 2018-03-16 ENCOUNTER — Inpatient Hospital Stay (HOSPITAL_BASED_OUTPATIENT_CLINIC_OR_DEPARTMENT_OTHER): Payer: Medicare PPO | Admitting: Hematology

## 2018-03-16 VITALS — BP 128/78 | HR 70 | Temp 98.2°F | Resp 16 | Wt 221.0 lb

## 2018-03-16 VITALS — BP 115/76 | HR 73 | Temp 98.7°F | Resp 20

## 2018-03-16 DIAGNOSIS — I10 Essential (primary) hypertension: Secondary | ICD-10-CM | POA: Diagnosis not present

## 2018-03-16 DIAGNOSIS — C189 Malignant neoplasm of colon, unspecified: Secondary | ICD-10-CM | POA: Diagnosis not present

## 2018-03-16 DIAGNOSIS — E1152 Type 2 diabetes mellitus with diabetic peripheral angiopathy with gangrene: Secondary | ICD-10-CM | POA: Diagnosis not present

## 2018-03-16 DIAGNOSIS — C7800 Secondary malignant neoplasm of unspecified lung: Secondary | ICD-10-CM

## 2018-03-16 DIAGNOSIS — C787 Secondary malignant neoplasm of liver and intrahepatic bile duct: Secondary | ICD-10-CM

## 2018-03-16 DIAGNOSIS — Z23 Encounter for immunization: Secondary | ICD-10-CM | POA: Diagnosis not present

## 2018-03-16 DIAGNOSIS — K769 Liver disease, unspecified: Secondary | ICD-10-CM | POA: Diagnosis not present

## 2018-03-16 DIAGNOSIS — Z5111 Encounter for antineoplastic chemotherapy: Secondary | ICD-10-CM | POA: Diagnosis not present

## 2018-03-16 DIAGNOSIS — E1142 Type 2 diabetes mellitus with diabetic polyneuropathy: Secondary | ICD-10-CM | POA: Diagnosis not present

## 2018-03-16 DIAGNOSIS — Z87891 Personal history of nicotine dependence: Secondary | ICD-10-CM

## 2018-03-16 DIAGNOSIS — Z452 Encounter for adjustment and management of vascular access device: Secondary | ICD-10-CM | POA: Diagnosis not present

## 2018-03-16 LAB — MAGNESIUM: Magnesium: 1.8 mg/dL (ref 1.7–2.4)

## 2018-03-16 LAB — CBC WITH DIFFERENTIAL/PLATELET
Abs Immature Granulocytes: 0.02 10*3/uL (ref 0.00–0.07)
BASOS ABS: 0.1 10*3/uL (ref 0.0–0.1)
BASOS PCT: 1 %
EOS ABS: 0.3 10*3/uL (ref 0.0–0.5)
EOS PCT: 4 %
HCT: 39.3 % (ref 39.0–52.0)
Hemoglobin: 12.5 g/dL — ABNORMAL LOW (ref 13.0–17.0)
IMMATURE GRANULOCYTES: 0 %
Lymphocytes Relative: 23 %
Lymphs Abs: 1.6 10*3/uL (ref 0.7–4.0)
MCH: 28.9 pg (ref 26.0–34.0)
MCHC: 31.8 g/dL (ref 30.0–36.0)
MCV: 91 fL (ref 80.0–100.0)
Monocytes Absolute: 0.4 10*3/uL (ref 0.1–1.0)
Monocytes Relative: 6 %
NEUTROS PCT: 66 %
Neutro Abs: 4.5 10*3/uL (ref 1.7–7.7)
PLATELETS: 254 10*3/uL (ref 150–400)
RBC: 4.32 MIL/uL (ref 4.22–5.81)
RDW: 13.4 % (ref 11.5–15.5)
WBC: 6.9 10*3/uL (ref 4.0–10.5)
nRBC: 0 % (ref 0.0–0.2)

## 2018-03-16 LAB — COMPREHENSIVE METABOLIC PANEL
ALBUMIN: 4.1 g/dL (ref 3.5–5.0)
ALT: 22 U/L (ref 0–44)
ANION GAP: 8 (ref 5–15)
AST: 20 U/L (ref 15–41)
Alkaline Phosphatase: 131 U/L — ABNORMAL HIGH (ref 38–126)
BUN: 12 mg/dL (ref 8–23)
CHLORIDE: 99 mmol/L (ref 98–111)
CO2: 29 mmol/L (ref 22–32)
Calcium: 9.1 mg/dL (ref 8.9–10.3)
Creatinine, Ser: 1.04 mg/dL (ref 0.61–1.24)
GFR calc Af Amer: >60 mL/min (ref >60)
GFR calc non Af Amer: >60 mL/min (ref >60)
GLUCOSE: 232 mg/dL — AB (ref 70–99)
POTASSIUM: 4.3 mmol/L (ref 3.5–5.1)
SODIUM: 136 mmol/L (ref 135–145)
Total Bilirubin: 0.6 mg/dL (ref 0.3–1.2)
Total Protein: 8.1 g/dL (ref 6.5–8.1)

## 2018-03-16 MED ORDER — FLUOROURACIL CHEMO INJECTION 2.5 GM/50ML
320.0000 mg/m2 | Freq: Once | INTRAVENOUS | Status: AC
  Administered 2018-03-16: 700 mg via INTRAVENOUS
  Filled 2018-03-16: qty 14

## 2018-03-16 MED ORDER — PALONOSETRON HCL INJECTION 0.25 MG/5ML
0.2500 mg | Freq: Once | INTRAVENOUS | Status: AC
  Administered 2018-03-16: 0.25 mg via INTRAVENOUS

## 2018-03-16 MED ORDER — LEUCOVORIN CALCIUM INJECTION 350 MG
900.0000 mg | Freq: Once | INTRAVENOUS | Status: AC
  Administered 2018-03-16: 900 mg via INTRAVENOUS
  Filled 2018-03-16: qty 35

## 2018-03-16 MED ORDER — INFLUENZA VAC SPLIT HIGH-DOSE 0.5 ML IM SUSY
0.5000 mL | PREFILLED_SYRINGE | INTRAMUSCULAR | Status: AC
  Administered 2018-03-16: 0.5 mL via INTRAMUSCULAR
  Filled 2018-03-16: qty 0.5

## 2018-03-16 MED ORDER — IRINOTECAN HCL CHEMO INJECTION 100 MG/5ML
144.0000 mg/m2 | Freq: Once | INTRAVENOUS | Status: AC
  Administered 2018-03-16: 320 mg via INTRAVENOUS
  Filled 2018-03-16: qty 1

## 2018-03-16 MED ORDER — PALONOSETRON HCL INJECTION 0.25 MG/5ML
INTRAVENOUS | Status: AC
  Filled 2018-03-16: qty 5

## 2018-03-16 MED ORDER — SODIUM CHLORIDE 0.9 % IV SOLN
INTRAVENOUS | Status: DC
  Administered 2018-03-16: 10:00:00 via INTRAVENOUS

## 2018-03-16 MED ORDER — ATROPINE SULFATE 1 MG/ML IJ SOLN
0.5000 mg | Freq: Once | INTRAMUSCULAR | Status: AC
  Administered 2018-03-16: 0.5 mg via INTRAVENOUS

## 2018-03-16 MED ORDER — ATROPINE SULFATE 1 MG/ML IJ SOLN
INTRAMUSCULAR | Status: AC
  Filled 2018-03-16: qty 1

## 2018-03-16 MED ORDER — SODIUM CHLORIDE 0.9 % IV SOLN
Freq: Once | INTRAVENOUS | Status: AC
  Administered 2018-03-16: 10:00:00 via INTRAVENOUS

## 2018-03-16 MED ORDER — SODIUM CHLORIDE 0.9 % IV SOLN
1920.0000 mg/m2 | INTRAVENOUS | Status: DC
  Administered 2018-03-16: 4250 mg via INTRAVENOUS
  Filled 2018-03-16: qty 85

## 2018-03-16 MED ORDER — SODIUM CHLORIDE 0.9 % IV SOLN
10.0000 mg | Freq: Once | INTRAVENOUS | Status: AC
  Administered 2018-03-16: 10 mg via INTRAVENOUS
  Filled 2018-03-16: qty 1

## 2018-03-16 NOTE — Progress Notes (Signed)
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 ([place])   05/27/2017 -  Chemotherapy    The patient had palonosetron (ALOXI) injection 0.25 mg, 0.25 mg, Intravenous,  Once, 14 of 16 cycles Administration: 0.25 mg (09/03/2017), 0.25 mg (10/01/2017), 0.25 mg (10/15/2017), 0.25 mg (10/29/2017), 0.25 mg (09/17/2017), 0.25 mg (03/02/2018), 0.25 mg (03/16/2018) irinotecan (CAMPTOSAR) 400 mg in dextrose 5 % 500 mL chemo infusion, 180 mg/m2, Intravenous,  Once, 14 of 16 cycles Dose modification: 144 mg/m2 (Cycle 4, Reason: Provider Judgment) Administration: 320 mg (09/03/2017), 320 mg (10/01/2017), 320 mg (10/15/2017), 320 mg (10/29/2017), 320 mg (09/17/2017), 320 mg (03/02/2018) leucovorin 888 mg in dextrose 5 % 250 mL infusion, 400 mg/m2, Intravenous,  Once, 14 of 16 cycles Administration: 900 mg (09/03/2017), 900 mg (10/01/2017), 900 mg (10/15/2017), 900 mg (10/29/2017), 900 mg (09/17/2017), 900 mg (03/02/2018) fluorouracil (ADRUCIL) chemo injection 900 mg, 400 mg/m2, Intravenous,  Once, 14 of 16 cycles Dose modification: 320 mg/m2 (Cycle 4, Reason: Provider Judgment) Administration: 700 mg (09/03/2017), 700 mg (10/01/2017), 700 mg (10/15/2017), 700 mg (10/29/2017), 700 mg (09/17/2017), 700 mg (03/02/2018) fluorouracil (ADRUCIL) 5,350 mg in sodium chloride 0.9 % 143 mL chemo infusion, 2,400 mg/m2, Intravenous, 1 Day/Dose, 14 of 16 cycles Dose modification: 1,920 mg/m2 (Cycle 4, Reason: Provider Judgment) Administration: 4,250 mg (09/03/2017), 4,250 mg (10/01/2017), 4,250 mg (10/15/2017), 4,250 mg (10/29/2017), 4,250 mg (09/17/2017), 4,250 mg (03/02/2018)  for chemotherapy treatment.     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 male returns for routine follow-up for metastatic colon cancer to the liver. Patient received treatment last week and is tolerating it well. He reports fatigue and mild nausea. He is still working full time. He denies any vomiting or diarrhea. Denies any fevers or chills. He reports his appetite at 100% and his energy level at 75%. He is maintaining his weight well. He is living at home alone and performs all his own ADLs.     REVIEW OF SYSTEMS:  Review of Systems  Constitutional: Positive for fatigue.  Gastrointestinal: Positive for nausea.  All other systems reviewed and are negative.    PAST MEDICAL/SURGICAL HISTORY:  Past Medical History:  Diagnosis Date  . Colon cancer ([place])   . Diabetes ([place])   . GERD (gastroesophageal reflux disease)   . Hypertension    Past Surgical History:  Procedure Laterality Date  . APPENDECTOMY     during colon surgery  . COLON SURGERY    . IR RADIOLOGIST EVAL & MGMT  11/26/2017  . RADIOFREQUENCY ABLATION N/A 12/10/2017   Procedure: CT MICROWAVE THERMAL ABLATION (LIVER);  Surgeon: Markus Daft, MD;  Location: [facility] ORS;  Service: Anesthesiology;  Laterality: N/A;  . SHOULDER SURGERY  2003     SOCIAL HISTORY:  Social History   Socioeconomic History  . Marital status: Married    Spouse name: Not on file  .  Number of children: Not on file  . Years of education: Not on file  . Highest education level: Not on file  Occupational History  . Not on file  Social Needs  . Financial resource strain: Not on file  . Food insecurity:    Worry: Not on file    Inability: Not on file  . Transportation needs:    Medical: Not on  file    Non-medical: Not on file  Tobacco Use  . Smoking status: Former Smoker    Last attempt to quit: 04/05/1995    Years since quitting: 22.9  . Smokeless tobacco: Never Used  Substance and Sexual Activity  . Alcohol use: Not Currently    Alcohol/week: 0.0 standard drinks    Comment: used to drink but stopped about 20 years ago  . Drug use: Never  . Sexual activity: Not on file  Lifestyle  . Physical activity:    Days per week: Not on file    Minutes per session: Not on file  . Stress: Not on file  Relationships  . Social connections:    Talks on phone: Not on file    Gets together: Not on file    Attends religious service: Not on file    Active member of club or organization: Not on file    Attends meetings of clubs or organizations: Not on file    Relationship status: Not on file  . Intimate partner violence:    Fear of current or ex partner: Not on file    Emotionally abused: Not on file    Physically abused: Not on file    Forced sexual activity: Not on file  Other Topics Concern  . Not on file  Social History Narrative  . Not on file    FAMILY HISTORY:  Family History  Problem Relation Age of Onset  . Diabetes Mother   . Cancer Father   . Bone cancer Father   . Colon cancer Neg Hx   . Colon polyps Neg Hx     CURRENT MEDICATIONS:  Outpatient Encounter Medications as of 03/16/2018  Medication Sig  . Alpha-Lipoic Acid 200 MG CAPS Take 1 capsule by mouth daily.  . Ascorbic Acid (VITAMIN C) 1000 MG tablet Take 1,000 mg by mouth daily.  . ASHWAGANDHA PO Take 800 mg by mouth daily.  Marland Kitchen aspirin 81 MG tablet Take 81 mg by mouth daily.  Marland Kitchen atorvastatin (LIPITOR) 10 MG tablet Take 10 mg by mouth daily.   Marland Kitchen BEE POLLEN PO Take 1 capsule by mouth daily.   . Blood Glucose Monitoring Suppl (ONE TOUCH ULTRA MINI) W/DEVICE KIT USE TO CHECK BLOOD SUGAR DAILY.  . cholecalciferol (VITAMIN D) 1000 units tablet Take 1,000 Units by mouth daily.  Marland Kitchen CINNAMON PO Take 1,000 mg by  mouth daily.  Marland Kitchen Cod Liver Oil CAPS Take 1 capsule by mouth daily.  . Coenzyme Q10 (CO Q 10) 100 MG CAPS Take 1 capsule by mouth daily.   . Cyanocobalamin (VITAMIN B-12) 5000 MCG TBDP Take 5,000 mcg by mouth daily.  . Flax Oil-Fish Oil-Borage Oil (FISH OIL-FLAX OIL-BORAGE OIL) CAPS Take 1 capsule by mouth daily.  . fluorouracil CALGB 38182 in sodium chloride 0.9 % 150 mL Inject into the vein over 96 hr.  . Garlic 9937 MG CAPS Take 1 capsule by mouth daily.  Marland Kitchen glipiZIDE (GLUCOTROL XL) 10 MG 24 hr tablet Take 10 mg by mouth daily.   . Glucosamine-Chondroit-Collagen ([employer] GLUCO-CHONDROIT PLUS UC-II PO) Take  1 tablet by mouth daily.   . IRINOTECAN HCL IV Inject into the vein.  Marland Kitchen LECITHIN PO Take 1 capsule by mouth daily.  Marland Kitchen LEUCOVORIN CALCIUM IV Inject into the vein.  Marland Kitchen lidocaine-prilocaine (EMLA) cream Apply small amount over port site and cover with plastic wrap one hour prior to appointment.  . magnesium oxide (MAG-OX) 400 MG tablet Take 400 mg by mouth daily.   . metFORMIN (GLUCOPHAGE) 500 MG tablet Take 1 tablet (500 mg total) by mouth 2 (two) times daily with a meal. (Patient taking differently: Take 500 mg by mouth daily with breakfast. )  . milk thistle 175 MG tablet Take 175 mg by mouth daily.  Marland Kitchen OVER THE COUNTER MEDICATION Take 1 capsule by mouth daily. Mushroom Complex 750 mg  . OVER THE COUNTER MEDICATION Take 1 capsule by mouth daily. Cura Med 750 mg  . oxyCODONE-acetaminophen (PERCOCET/ROXICET) 5-325 MG tablet Take 1 tablet by mouth every 6 (six) hours as needed. for pain  . prochlorperazine (COMPAZINE) 10 MG tablet Take 1 tablet (10 mg total) by mouth every 6 (six) hours as needed for nausea or vomiting.  . valsartan-hydrochlorothiazide (DIOVAN-HCT) 160-25 MG tablet Take 1 tablet by mouth daily.   No facility-administered encounter medications on file as of 03/16/2018.     ALLERGIES:  No Known Allergies   PHYSICAL EXAM:  ECOG Performance status: 1  Vitals:   03/16/18 0849    BP: 128/78  Pulse: 70  Resp: 16  Temp: 98.2 F (36.8 C)  SpO2: 98%   Filed Weights   03/16/18 0849  Weight: 221 lb (100.2 kg)    Physical Exam  Constitutional: He is oriented to person, place, and time. He appears well-developed and well-nourished.  Cardiovascular: Normal rate, regular rhythm and normal heart sounds.  Pulmonary/Chest: Effort normal and breath sounds normal.  Musculoskeletal: Normal range of motion.  Neurological: He is alert and oriented to person, place, and time.  Skin: Skin is warm and dry.  Psychiatric: He has a normal mood and affect. His behavior is normal. Judgment and thought content normal.  Abdomen: No palpable hepatospleno megaly.   LABORATORY DATA:  I have reviewed the labs as listed.  CBC    Component Value Date/Time   WBC 6.9 03/16/2018 0809   RBC 4.32 03/16/2018 0809   HGB 12.5 (L) 03/16/2018 0809   HCT 39.3 03/16/2018 0809   PLT 254 03/16/2018 0809   MCV 91.0 03/16/2018 0809   MCH 28.9 03/16/2018 0809   MCHC 31.8 03/16/2018 0809   RDW 13.4 03/16/2018 0809   LYMPHSABS 1.6 03/16/2018 0809   MONOABS 0.4 03/16/2018 0809   EOSABS 0.3 03/16/2018 0809   BASOSABS 0.1 03/16/2018 0809   CMP Latest Ref Rng & Units 03/16/2018 03/02/2018 02/05/2018  Glucose 70 - 99 mg/dL 232(H) 245(H) 158(H)  BUN 8 - 23 mg/dL _0 Creatinine 0.61 - 1.24 mg/dL 1.04 1.08 1.07  Sodium 135 - 145 mmol/L 136 134(L) 136  Potassium 3.5 - 5.1 mmol/L 4.3 4.5 3.8  Chloride 98 - 111 mmol/L 99 98 100  CO2 22 - 32 mmol/L _1 Calcium 8.9 - 10.3 mg/dL 9.1 9.5 9.1  Total Protein 6.5 - 8.1 g/dL 8.1 7.8 7.5  Total Bilirubin 0.3 - 1.2 mg/dL 0.6 0.5 0.6  Alkaline Phos 38 - 126 U/L 131(H) 124 93  AST 15 - 41 U/L _2 ALT 0 - 44 U/L 22 20 27  ASSESSMENT & PLAN:   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  He tolerated it very well.  CT CAP showed stable left upper lobe subcentimeter lung nodule, segment 4 lesion in the liver has decreased to 2.4 cm.  -He was evaluated by Dr. Anselm Pancoast, who thought microwave thermal ablation would be the best procedure for him.  He would like to save  Y 90 for later recurrences.  An MRI of the liver dated 11/21/2017 showed left hepatic lobe lesion measuring 2.6 x 2.1 cm.  No other lesions were seen. -He underwent left hepatic lobe microwave ablation on 12/10/2017.  He did very well with minimal pain and complications.  He is able to do all his day-to-day activities without any problems. - Last CEA of went up to 19.9.  He is completely asymptomatic. -CT scan of the abdomen pelvis on 02/05/2018 showed left hepatic lesion with central high attenuation and a large area of low-attenuation.  There is a new left hepatic lobe lesion measuring 1.3 cm.  CT chest showed similar appearing small nodules. - MRI of the liver dated 02/25/2018 shows heterogeneous mass which surrounds the right side of the ablation defect and extends inferiorly in segment 4B measuring 6.9 x 5.6 cm, segment 2 lesion measuring 2.6 cm, segment 8 subcapsular 1.6 cm lesion.  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 - He has at least 3 new lesions and questionable capsular meta stasis.  I do not believe he is a candidate for local therapy.  I have recommended going back on chemotherapy.  His last chemo was in July 2019, dose reduced FOLFIRI. - FOLFIRI  started back on 03/02/2018.  He tolerated it very well. -He may proceed with cycle 2 of FOLFIRI today.  I will see him back in 4 weeks for follow-up. -I plan to repeat the scans after 2 to 3 months of treatment.  2.  Diabetes: He is continuing metformin 500 mg daily and glipizide once daily.  He is also continuing Lipitor.  3.  Peripheral neuropathy: He has mild numbness in the feet, predominantly at  bedtime.  This is likely from his diabetes.  He never received oxaliplatin based chemotherapy.  4.  Hypertension: He will continue valsartan HCTZ 160-64m daily.      Orders placed this encounter:  Orders Placed This Encounter  Procedures  . Magnesium  . CBC with Differential/Platelet  . Comprehensive metabolic panel  . CEA      SDerek Jack MD [phone]

## 2018-03-16 NOTE — Progress Notes (Signed)
Tolerated infusion w/o adverse reaction.  Alert, in no distress.  VSS.  Discharged ambulatory in c/o family.  

## 2018-03-16 NOTE — Patient Instructions (Signed)
[facility] at [facility] Discharge Instructions     Thank you for choosing [facility] at [facility] to provide your oncology and hematology care.  To afford each patient quality time with our provider, please arrive at least 15 minutes before your scheduled appointment time.   If you have a lab appointment with the Cancer Center please come in thru the  Main Entrance and check in at the main information desk  You need to re-schedule your appointment should you arrive 10 or more minutes late.  We strive to give you quality time with our providers, and arriving late affects you and other patients whose appointments are after yours.  Also, if you no show three or more times for appointments you may be dismissed from the clinic at the providers discretion.     Again, thank you for choosing [facility].  Our hope is that these requests will decrease the amount of time that you wait before being seen by our physicians.       _____________________________________________________________  Should you have questions after your visit to [facility], please contact our office at [phone] between the hours of 8:00 a.m. and 4:30 p.m.  Voicemails left after 4:00 p.m. will not be returned until the following business day.  For prescription refill requests, have your pharmacy contact our office and allow 72 hours.    Cancer Center Support Programs:   > Cancer Support Group  2nd Tuesday of the month 1pm-2pm, Journey Room    

## 2018-03-16 NOTE — Assessment & Plan Note (Signed)
1.  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  He tolerated it very well.  CT CAP showed stable left upper lobe subcentimeter lung nodule, segment 4 lesion in the liver has decreased to 2.4 cm.  -He was evaluated by Dr. Anselm Pancoast, who thought microwave thermal ablation would be the best procedure for him.  He would like to save  Y 90 for later recurrences.  An MRI of the liver dated 11/21/2017 showed left hepatic lobe lesion measuring 2.6 x 2.1 cm.  No other lesions were seen. -He underwent left hepatic lobe microwave ablation on 12/10/2017.  He did very well with minimal pain and complications.  He is able to do all his day-to-day activities without any problems. - Last CEA of went up to 19.9.  He is completely asymptomatic. -CT scan of the abdomen pelvis on 02/05/2018 showed left hepatic lesion with central high attenuation and a large area of low-attenuation.  There is a new left hepatic lobe lesion measuring 1.3 cm.  CT chest showed similar appearing small nodules. - MRI of the liver dated 02/25/2018 shows heterogeneous mass which surrounds the right side of the ablation defect and extends inferiorly in segment 4B measuring 6.9 x 5.6 cm, segment 2 lesion measuring 2.6 cm, segment 8 subcapsular 1.6 cm lesion.  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 - He has at least 3 new lesions and questionable capsular meta stasis.  I do not believe he is a candidate for local therapy.  I have recommended going back on chemotherapy.  His last chemo was in July 2019, dose reduced FOLFIRI. - FOLFIRI  started back on 03/02/2018.  He tolerated it very well. -He may proceed with cycle 2 of FOLFIRI today.  I will see him back in 4 weeks for follow-up. -I plan to repeat the scans after 2 to 3 months of treatment.  2.  Diabetes: He is continuing metformin 500 mg daily and  glipizide once daily.  He is also continuing Lipitor.  3.  Peripheral neuropathy: He has mild numbness in the feet, predominantly at bedtime.  This is likely from his diabetes.  He never received oxaliplatin based chemotherapy.  4.  Hypertension: He will continue valsartan HCTZ 160-61m daily.

## 2018-03-17 MED ORDER — ONDANSETRON HCL 4 MG/2ML IJ SOLN
INTRAMUSCULAR | Status: AC
  Filled 2018-03-17: qty 2

## 2018-03-18 ENCOUNTER — Other Ambulatory Visit: Payer: Self-pay

## 2018-03-18 ENCOUNTER — Encounter (HOSPITAL_COMMUNITY): Payer: Self-pay

## 2018-03-18 ENCOUNTER — Inpatient Hospital Stay (HOSPITAL_COMMUNITY): Payer: Medicare PPO

## 2018-03-18 DIAGNOSIS — R109 Unspecified abdominal pain: Secondary | ICD-10-CM

## 2018-03-18 DIAGNOSIS — Z23 Encounter for immunization: Secondary | ICD-10-CM | POA: Diagnosis not present

## 2018-03-18 DIAGNOSIS — C7800 Secondary malignant neoplasm of unspecified lung: Secondary | ICD-10-CM | POA: Diagnosis not present

## 2018-03-18 DIAGNOSIS — C189 Malignant neoplasm of colon, unspecified: Secondary | ICD-10-CM

## 2018-03-18 DIAGNOSIS — Z5111 Encounter for antineoplastic chemotherapy: Secondary | ICD-10-CM | POA: Diagnosis not present

## 2018-03-18 DIAGNOSIS — E1142 Type 2 diabetes mellitus with diabetic polyneuropathy: Secondary | ICD-10-CM | POA: Diagnosis not present

## 2018-03-18 DIAGNOSIS — I10 Essential (primary) hypertension: Secondary | ICD-10-CM | POA: Diagnosis not present

## 2018-03-18 DIAGNOSIS — C787 Secondary malignant neoplasm of liver and intrahepatic bile duct: Principal | ICD-10-CM

## 2018-03-18 DIAGNOSIS — Z452 Encounter for adjustment and management of vascular access device: Secondary | ICD-10-CM | POA: Diagnosis not present

## 2018-03-18 DIAGNOSIS — K769 Liver disease, unspecified: Secondary | ICD-10-CM | POA: Diagnosis not present

## 2018-03-18 MED ORDER — HEPARIN SOD (PORK) LOCK FLUSH 100 UNIT/ML IV SOLN
500.0000 [IU] | Freq: Once | INTRAVENOUS | Status: AC | PRN
  Administered 2018-03-18: 500 [IU]
  Filled 2018-03-18: qty 5

## 2018-03-18 MED ORDER — ATROPINE SULFATE 1 MG/ML IJ SOLN
0.5000 mg | Freq: Once | INTRAMUSCULAR | Status: AC
  Administered 2018-03-18: 0.5 mg via INTRAVENOUS
  Filled 2018-03-18: qty 1

## 2018-03-18 MED ORDER — SODIUM CHLORIDE 0.9% FLUSH: 10.0000 mL | INTRAVENOUS | Status: AC | PRN

## 2018-03-18 MED ORDER — SODIUM CHLORIDE 0.9% FLUSH
10.0000 mL | INTRAVENOUS | Status: DC | PRN
  Administered 2018-03-18 (×2): 10 mL
  Filled 2018-03-18 (×2): qty 10

## 2018-03-18 MED ORDER — DIPHENOXYLATE-ATROPINE 2.5-0.025 MG PO TABS: 1.0000 | ORAL_TABLET | Freq: Four times a day (QID) | ORAL | 0 refills | Status: AC | PRN

## 2018-03-18 NOTE — Progress Notes (Signed)
.  Christopher Friedman returns today for port de access and flush after 46 hr continous infusion of 58fu. Tolerated infusion without problems. Portacath located rt chest wall was  deaccessed and flushed with 25ml NS and 500U/57ml Heparin and needle removed intact.  Procedure without incident. Patient tolerated procedure well.   Atropine given IV due to stomach cramping. Lomotil prescription called in to [place] [facility] for stomach cramping. [name]'s # given to patient. We asked that patient call back tomorrow to give an update on abdominal cramping. Patient not having diarrhea. Patient given future appt schedule. Patient discharged ambulatory and in stable condition to self.

## 2018-03-18 NOTE — Addendum Note (Signed)
Addended by: Gerhard Perches on: 03/18/2018 10:25 AM   Modules accepted: Orders

## 2018-03-18 NOTE — Addendum Note (Signed)
Addended by: Gerhard Perches on: 03/18/2018 09:55 AM   Modules accepted: Orders, SmartSet

## 2018-03-19 ENCOUNTER — Encounter (HOSPITAL_COMMUNITY): Payer: Self-pay | Admitting: *Deleted

## 2018-03-19 NOTE — Progress Notes (Signed)
I attempted to call patient today to assess his stomach cramping to see if the medication we called in had helped. I was unable to get him on the phone. I left a detailed voicemail and asked that he return my call today or call tomorrow and ask to speak to one of the other nurses.  I left both phone numbers of how to contact our office.

## 2018-03-24 ENCOUNTER — Encounter: Payer: Self-pay | Admitting: Radiology

## 2018-03-30 ENCOUNTER — Inpatient Hospital Stay (HOSPITAL_COMMUNITY): Payer: Medicare PPO

## 2018-03-30 ENCOUNTER — Inpatient Hospital Stay (HOSPITAL_COMMUNITY): Payer: Medicare PPO | Attending: Hematology

## 2018-03-30 ENCOUNTER — Encounter (HOSPITAL_COMMUNITY): Payer: Self-pay

## 2018-03-30 VITALS — BP 143/89 | HR 84 | Temp 98.5°F | Resp 18 | Wt 218.6 lb

## 2018-03-30 DIAGNOSIS — Z87891 Personal history of nicotine dependence: Secondary | ICD-10-CM | POA: Diagnosis not present

## 2018-03-30 DIAGNOSIS — I10 Essential (primary) hypertension: Secondary | ICD-10-CM | POA: Insufficient documentation

## 2018-03-30 DIAGNOSIS — C189 Malignant neoplasm of colon, unspecified: Secondary | ICD-10-CM

## 2018-03-30 DIAGNOSIS — Z452 Encounter for adjustment and management of vascular access device: Secondary | ICD-10-CM | POA: Insufficient documentation

## 2018-03-30 DIAGNOSIS — Z7984 Long term (current) use of oral hypoglycemic drugs: Secondary | ICD-10-CM | POA: Diagnosis not present

## 2018-03-30 DIAGNOSIS — E1142 Type 2 diabetes mellitus with diabetic polyneuropathy: Secondary | ICD-10-CM | POA: Diagnosis not present

## 2018-03-30 DIAGNOSIS — Z5111 Encounter for antineoplastic chemotherapy: Secondary | ICD-10-CM | POA: Insufficient documentation

## 2018-03-30 DIAGNOSIS — C787 Secondary malignant neoplasm of liver and intrahepatic bile duct: Secondary | ICD-10-CM | POA: Insufficient documentation

## 2018-03-30 DIAGNOSIS — C7800 Secondary malignant neoplasm of unspecified lung: Secondary | ICD-10-CM | POA: Diagnosis not present

## 2018-03-30 LAB — CBC WITH DIFFERENTIAL/PLATELET
ABS IMMATURE GRANULOCYTES: 0.05 10*3/uL (ref 0.00–0.07)
Basophils Absolute: 0.1 10*3/uL (ref 0.0–0.1)
Basophils Relative: 1 %
EOS ABS: 0.2 10*3/uL (ref 0.0–0.5)
EOS PCT: 2 %
HEMATOCRIT: 40.1 % (ref 39.0–52.0)
HEMOGLOBIN: 12.8 g/dL — AB (ref 13.0–17.0)
IMMATURE GRANULOCYTES: 1 %
LYMPHS ABS: 1.6 10*3/uL (ref 0.7–4.0)
Lymphocytes Relative: 17 %
MCH: 28.2 pg (ref 26.0–34.0)
MCHC: 31.9 g/dL (ref 30.0–36.0)
MCV: 88.3 fL (ref 80.0–100.0)
MONOS PCT: 9 %
Monocytes Absolute: 0.9 10*3/uL (ref 0.1–1.0)
Neutro Abs: 6.6 10*3/uL (ref 1.7–7.7)
Neutrophils Relative %: 70 %
Platelets: 239 10*3/uL (ref 150–400)
RBC: 4.54 MIL/uL (ref 4.22–5.81)
RDW: 13.9 % (ref 11.5–15.5)
WBC: 9.4 10*3/uL (ref 4.0–10.5)
nRBC: 0 % (ref 0.0–0.2)

## 2018-03-30 LAB — COMPREHENSIVE METABOLIC PANEL
ALK PHOS: 128 U/L — AB (ref 38–126)
ALT: 24 U/L (ref 0–44)
AST: 30 U/L (ref 15–41)
Albumin: 4 g/dL (ref 3.5–5.0)
Anion gap: 11 (ref 5–15)
BUN: 15 mg/dL (ref 8–23)
CALCIUM: 9.5 mg/dL (ref 8.9–10.3)
CO2: 26 mmol/L (ref 22–32)
CREATININE: 1.15 mg/dL (ref 0.61–1.24)
Chloride: 97 mmol/L — ABNORMAL LOW (ref 98–111)
Glucose, Bld: 291 mg/dL — ABNORMAL HIGH (ref 70–99)
Potassium: 4.3 mmol/L (ref 3.5–5.1)
Sodium: 134 mmol/L — ABNORMAL LOW (ref 135–145)
Total Bilirubin: 0.7 mg/dL (ref 0.3–1.2)
Total Protein: 8.2 g/dL — ABNORMAL HIGH (ref 6.5–8.1)

## 2018-03-30 MED ORDER — ATROPINE SULFATE 1 MG/ML IJ SOLN
0.5000 mg | Freq: Once | INTRAMUSCULAR | Status: AC
  Administered 2018-03-30: 0.5 mg via INTRAVENOUS
  Filled 2018-03-30: qty 1

## 2018-03-30 MED ORDER — PALONOSETRON HCL INJECTION 0.25 MG/5ML
0.2500 mg | Freq: Once | INTRAVENOUS | Status: AC
  Administered 2018-03-30: 0.25 mg via INTRAVENOUS
  Filled 2018-03-30: qty 5

## 2018-03-30 MED ORDER — SODIUM CHLORIDE 0.9 % IV SOLN
1920.0000 mg/m2 | INTRAVENOUS | Status: DC
  Administered 2018-03-30: 4250 mg via INTRAVENOUS
  Filled 2018-03-30: qty 85

## 2018-03-30 MED ORDER — LEUCOVORIN CALCIUM INJECTION 350 MG
900.0000 mg | Freq: Once | INTRAVENOUS | Status: AC
  Administered 2018-03-30: 900 mg via INTRAVENOUS
  Filled 2018-03-30: qty 10

## 2018-03-30 MED ORDER — SODIUM CHLORIDE 0.9 % IV SOLN
Freq: Once | INTRAVENOUS | Status: AC
  Administered 2018-03-30: 10:00:00 via INTRAVENOUS

## 2018-03-30 MED ORDER — SODIUM CHLORIDE 0.9% FLUSH
10.0000 mL | INTRAVENOUS | Status: DC | PRN
  Administered 2018-03-30: 10 mL
  Filled 2018-03-30: qty 10

## 2018-03-30 MED ORDER — FLUOROURACIL CHEMO INJECTION 2.5 GM/50ML
320.0000 mg/m2 | Freq: Once | INTRAVENOUS | Status: AC
  Administered 2018-03-30: 700 mg via INTRAVENOUS
  Filled 2018-03-30: qty 14

## 2018-03-30 MED ORDER — IRINOTECAN HCL CHEMO INJECTION 100 MG/5ML
144.0000 mg/m2 | Freq: Once | INTRAVENOUS | Status: AC
  Administered 2018-03-30: 320 mg via INTRAVENOUS
  Filled 2018-03-30: qty 15

## 2018-03-30 MED ORDER — SODIUM CHLORIDE 0.9 % IV SOLN
INTRAVENOUS | Status: DC
  Administered 2018-03-30: 11:00:00 via INTRAVENOUS

## 2018-03-30 MED ORDER — SODIUM CHLORIDE 0.9 % IV SOLN
10.0000 mg | Freq: Once | INTRAVENOUS | Status: AC
  Administered 2018-03-30: 10 mg via INTRAVENOUS
  Filled 2018-03-30: qty 1

## 2018-03-30 NOTE — Progress Notes (Signed)
Patient tolerated treatment with no complaints voiced.  Port site clean and dry with no bruising or swelling noted at site.  Dressing intact.  Chemotherapy pump connected with no alarms noted.  VSS with discharge and left ambulatory with family with no s/s of distress noted.

## 2018-03-30 NOTE — Patient Instructions (Signed)
[place] Discharge Instructions for Patients Receiving Chemotherapy  Today you received the following chemotherapy agents FOLFIRI   If you develop nausea and vomiting that is not controlled by your nausea medication, call the clinic.   BELOW ARE SYMPTOMS THAT SHOULD BE REPORTED IMMEDIATELY:  *FEVER GREATER THAN 100.5 F  *CHILLS WITH OR WITHOUT FEVER  NAUSEA AND VOMITING THAT IS NOT CONTROLLED WITH YOUR NAUSEA MEDICATION  *UNUSUAL SHORTNESS OF BREATH  *UNUSUAL BRUISING OR BLEEDING  TENDERNESS IN MOUTH AND THROAT WITH OR WITHOUT PRESENCE OF ULCERS  *URINARY PROBLEMS  *BOWEL PROBLEMS  UNUSUAL RASH Items with * indicate a potential emergency and should be followed up as soon as possible.  Feel free to call the clinic should you have any questions or concerns. The clinic phone number is [phone].  Please show the [place] at check-in to the Emergency Department and triage nurse.

## 2018-04-01 ENCOUNTER — Encounter (HOSPITAL_COMMUNITY): Payer: Self-pay

## 2018-04-01 ENCOUNTER — Other Ambulatory Visit: Payer: Self-pay

## 2018-04-01 ENCOUNTER — Inpatient Hospital Stay (HOSPITAL_COMMUNITY): Payer: Medicare PPO

## 2018-04-01 VITALS — BP 126/83 | HR 90 | Temp 97.8°F | Resp 18

## 2018-04-01 DIAGNOSIS — C189 Malignant neoplasm of colon, unspecified: Secondary | ICD-10-CM | POA: Diagnosis not present

## 2018-04-01 DIAGNOSIS — Z5111 Encounter for antineoplastic chemotherapy: Secondary | ICD-10-CM | POA: Diagnosis not present

## 2018-04-01 DIAGNOSIS — Z7984 Long term (current) use of oral hypoglycemic drugs: Secondary | ICD-10-CM | POA: Diagnosis not present

## 2018-04-01 DIAGNOSIS — C787 Secondary malignant neoplasm of liver and intrahepatic bile duct: Secondary | ICD-10-CM | POA: Diagnosis not present

## 2018-04-01 DIAGNOSIS — C7800 Secondary malignant neoplasm of unspecified lung: Secondary | ICD-10-CM | POA: Diagnosis not present

## 2018-04-01 DIAGNOSIS — Z87891 Personal history of nicotine dependence: Secondary | ICD-10-CM | POA: Diagnosis not present

## 2018-04-01 DIAGNOSIS — Z452 Encounter for adjustment and management of vascular access device: Secondary | ICD-10-CM | POA: Diagnosis not present

## 2018-04-01 DIAGNOSIS — E1142 Type 2 diabetes mellitus with diabetic polyneuropathy: Secondary | ICD-10-CM | POA: Diagnosis not present

## 2018-04-01 DIAGNOSIS — I10 Essential (primary) hypertension: Secondary | ICD-10-CM | POA: Diagnosis not present

## 2018-04-01 MED ORDER — SODIUM CHLORIDE 0.9% FLUSH
10.0000 mL | INTRAVENOUS | Status: DC | PRN
  Administered 2018-04-01: 10 mL
  Filled 2018-04-01: qty 10

## 2018-04-01 MED ORDER — HEPARIN SOD (PORK) LOCK FLUSH 100 UNIT/ML IV SOLN
500.0000 [IU] | Freq: Once | INTRAVENOUS | Status: AC | PRN
  Administered 2018-04-01: 500 [IU]

## 2018-04-01 NOTE — Progress Notes (Signed)
Christopher Friedman presents to have home infusion pump d/c'd and for port-a-cath deaccess with flush.  Proper placement of portacath confirmed by CXR.  Portacath located right chest wall accessed with  H 20 needle.  Good blood return present. Portacath flushed with NS and 500U/38ml Heparin, and needle removed intact.  Procedure tolerated well and without incident.  Discharged ambulatory.

## 2018-04-03 ENCOUNTER — Other Ambulatory Visit: Payer: Self-pay

## 2018-04-03 ENCOUNTER — Inpatient Hospital Stay (HOSPITAL_BASED_OUTPATIENT_CLINIC_OR_DEPARTMENT_OTHER): Payer: Medicare PPO | Admitting: Hematology

## 2018-04-03 ENCOUNTER — Other Ambulatory Visit (HOSPITAL_COMMUNITY): Payer: Self-pay | Admitting: *Deleted

## 2018-04-03 ENCOUNTER — Encounter (HOSPITAL_COMMUNITY): Payer: Self-pay | Admitting: Hematology

## 2018-04-03 ENCOUNTER — Inpatient Hospital Stay (HOSPITAL_COMMUNITY): Payer: Medicare PPO

## 2018-04-03 DIAGNOSIS — E1142 Type 2 diabetes mellitus with diabetic polyneuropathy: Secondary | ICD-10-CM

## 2018-04-03 DIAGNOSIS — Z7984 Long term (current) use of oral hypoglycemic drugs: Secondary | ICD-10-CM | POA: Diagnosis not present

## 2018-04-03 DIAGNOSIS — Z87891 Personal history of nicotine dependence: Secondary | ICD-10-CM | POA: Diagnosis not present

## 2018-04-03 DIAGNOSIS — C787 Secondary malignant neoplasm of liver and intrahepatic bile duct: Secondary | ICD-10-CM | POA: Diagnosis not present

## 2018-04-03 DIAGNOSIS — C7800 Secondary malignant neoplasm of unspecified lung: Secondary | ICD-10-CM

## 2018-04-03 DIAGNOSIS — R509 Fever, unspecified: Secondary | ICD-10-CM | POA: Diagnosis not present

## 2018-04-03 DIAGNOSIS — Z5111 Encounter for antineoplastic chemotherapy: Secondary | ICD-10-CM | POA: Diagnosis not present

## 2018-04-03 DIAGNOSIS — C189 Malignant neoplasm of colon, unspecified: Secondary | ICD-10-CM

## 2018-04-03 DIAGNOSIS — R300 Dysuria: Secondary | ICD-10-CM | POA: Diagnosis not present

## 2018-04-03 DIAGNOSIS — R110 Nausea: Secondary | ICD-10-CM | POA: Diagnosis not present

## 2018-04-03 DIAGNOSIS — I10 Essential (primary) hypertension: Secondary | ICD-10-CM | POA: Diagnosis not present

## 2018-04-03 DIAGNOSIS — Z452 Encounter for adjustment and management of vascular access device: Secondary | ICD-10-CM | POA: Diagnosis not present

## 2018-04-03 LAB — COMPREHENSIVE METABOLIC PANEL WITH GFR
ALT: 20 U/L (ref 0–44)
AST: 15 U/L (ref 15–41)
Albumin: 3.6 g/dL (ref 3.5–5.0)
Alkaline Phosphatase: 113 U/L (ref 38–126)
Anion gap: 10 (ref 5–15)
BUN: 21 mg/dL (ref 8–23)
CO2: 26 mmol/L (ref 22–32)
Calcium: 8.8 mg/dL — ABNORMAL LOW (ref 8.9–10.3)
Chloride: 93 mmol/L — ABNORMAL LOW (ref 98–111)
Creatinine, Ser: 1.18 mg/dL (ref 0.61–1.24)
GFR calc Af Amer: >60 mL/min (ref >60)
GFR calc non Af Amer: >60 mL/min (ref >60)
Glucose, Bld: 387 mg/dL — ABNORMAL HIGH (ref 70–99)
Potassium: 3.9 mmol/L (ref 3.5–5.1)
Sodium: 129 mmol/L — ABNORMAL LOW (ref 135–145)
Total Bilirubin: 0.8 mg/dL (ref 0.3–1.2)
Total Protein: 7.5 g/dL (ref 6.5–8.1)

## 2018-04-03 LAB — CBC WITH DIFFERENTIAL/PLATELET
Abs Immature Granulocytes: 0.03 10*3/uL (ref 0.00–0.07)
Basophils Absolute: 0 10*3/uL (ref 0.0–0.1)
Basophils Relative: 1 %
EOS ABS: 0 10*3/uL (ref 0.0–0.5)
Eosinophils Relative: 1 %
HCT: 35.9 % — ABNORMAL LOW (ref 39.0–52.0)
Hemoglobin: 11.6 g/dL — ABNORMAL LOW (ref 13.0–17.0)
Immature Granulocytes: 1 %
Lymphocytes Relative: 28 %
Lymphs Abs: 1.5 10*3/uL (ref 0.7–4.0)
MCH: 28.2 pg (ref 26.0–34.0)
MCHC: 32.3 g/dL (ref 30.0–36.0)
MCV: 87.1 fL (ref 80.0–100.0)
MONO ABS: 0.2 10*3/uL (ref 0.1–1.0)
Monocytes Relative: 4 %
Neutro Abs: 3.4 10*3/uL (ref 1.7–7.7)
Neutrophils Relative %: 65 %
Platelets: 203 10*3/uL (ref 150–400)
RBC: 4.12 MIL/uL — ABNORMAL LOW (ref 4.22–5.81)
RDW: 13.9 % (ref 11.5–15.5)
WBC: 5.1 10*3/uL (ref 4.0–10.5)
nRBC: 0 % (ref 0.0–0.2)

## 2018-04-03 LAB — URINALYSIS, ROUTINE W REFLEX MICROSCOPIC
Bacteria, UA: NONE SEEN
Bilirubin Urine: NEGATIVE
Glucose, UA: >=500 mg/dL — AB
Hgb urine dipstick: NEGATIVE
Ketones, ur: NEGATIVE mg/dL
Leukocytes, UA: NEGATIVE
Nitrite: NEGATIVE
Protein, ur: NEGATIVE mg/dL
Specific Gravity, Urine: 1.023 (ref 1.005–1.030)
pH: 5 (ref 5.0–8.0)

## 2018-04-03 NOTE — Patient Instructions (Signed)
[facility] at [facility] Discharge Instructions     Thank you for choosing [facility] at [facility] to provide your oncology and hematology care.  To afford each patient quality time with our provider, please arrive at least 15 minutes before your scheduled appointment time.   If you have a lab appointment with the Cancer Center please come in thru the  Main Entrance and check in at the main information desk  You need to re-schedule your appointment should you arrive 10 or more minutes late.  We strive to give you quality time with our providers, and arriving late affects you and other patients whose appointments are after yours.  Also, if you no show three or more times for appointments you may be dismissed from the clinic at the providers discretion.     Again, thank you for choosing [facility].  Our hope is that these requests will decrease the amount of time that you wait before being seen by our physicians.       _____________________________________________________________  Should you have questions after your visit to [facility], please contact our office at [phone] between the hours of 8:00 a.m. and 4:30 p.m.  Voicemails left after 4:00 p.m. will not be returned until the following business day.  For prescription refill requests, have your pharmacy contact our office and allow 72 hours.    Cancer Center Support Programs:   > Cancer Support Group  2nd Tuesday of the month 1pm-2pm, Journey Room    

## 2018-04-03 NOTE — Progress Notes (Signed)
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 ([place])   05/27/2017 -  Chemotherapy    The patient had palonosetron (ALOXI) injection 0.25 mg, 0.25 mg, Intravenous,  Once, 15 of 16 cycles Administration: 0.25 mg (09/03/2017), 0.25 mg (10/01/2017), 0.25 mg (10/15/2017), 0.25 mg (10/29/2017), 0.25 mg (09/17/2017), 0.25 mg (03/02/2018), 0.25 mg (03/16/2018), 0.25 mg (03/30/2018) irinotecan (CAMPTOSAR) 400 mg in dextrose 5 % 500 mL chemo infusion, 180 mg/m2, Intravenous,  Once, 15 of 16 cycles Dose modification: 144 mg/m2 (Cycle 4, Reason: Provider Judgment) Administration: 320 mg (09/03/2017), 320 mg (10/01/2017), 320 mg (10/15/2017), 320 mg (10/29/2017), 320 mg (09/17/2017), 320 mg (03/02/2018), 320 mg (03/16/2018), 320 mg (03/30/2018) leucovorin 888 mg in dextrose 5 % 250 mL infusion, 400 mg/m2, Intravenous,  Once, 15 of 16 cycles Administration: 900 mg (09/03/2017), 900 mg (10/01/2017), 900 mg (10/15/2017), 900 mg (10/29/2017), 900 mg (09/17/2017), 900 mg (03/02/2018), 900 mg (03/16/2018), 900 mg (03/30/2018) fluorouracil (ADRUCIL) chemo injection 900 mg, 400 mg/m2, Intravenous,  Once, 15 of 16 cycles Dose modification: 320 mg/m2 (Cycle 4, Reason: Provider Judgment) Administration: 700 mg (09/03/2017), 700 mg (10/01/2017), 700 mg (10/15/2017), 700 mg (10/29/2017), 700 mg (09/17/2017), 700 mg (03/02/2018), 700 mg (03/16/2018), 700 mg (03/30/2018) fluorouracil (ADRUCIL) 5,350 mg in sodium chloride 0.9 % 143 mL chemo infusion, 2,400 mg/m2, Intravenous, 1 Day/Dose, 15 of 16 cycles Dose modification: 1,920 mg/m2 (Cycle 4, Reason: Provider Judgment) Administration: 4,250 mg (09/03/2017), 4,250 mg (10/01/2017), 4,250 mg  (10/15/2017), 4,250 mg (10/29/2017), 4,250 mg (09/17/2017), 4,250 mg (03/02/2018), 4,250 mg (03/16/2018), 4,250 mg (03/30/2018)  for chemotherapy treatment.     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 Kolton 67 y.o. male returns for routine follow-up for metastatic colon cancer to the liver. He is complaining for burning with urination that started yesterday. He has been having low grade fevers and chills at nighttime for the past 2 days since he had his pump taken off. He denies any new cough or SOB. Denies any new pains. Denies any headaches.    REVIEW OF SYSTEMS:  Review of Systems  Constitutional: Positive for chills and fatigue.  Gastrointestinal: Positive for constipation and nausea.  All other systems reviewed and are negative.    PAST MEDICAL/SURGICAL HISTORY:  Past Medical History:  Diagnosis Date  . Colon cancer ([place])   . Diabetes ([place])   . GERD (gastroesophageal reflux disease)   . Hypertension    Past Surgical History:  Procedure Laterality Date  . APPENDECTOMY     during colon surgery  . COLON SURGERY    . IR RADIOLOGIST EVAL & MGMT  11/26/2017  . RADIOFREQUENCY ABLATION N/A 12/10/2017   Procedure: CT MICROWAVE THERMAL ABLATION (LIVER);  Surgeon: Markus Daft, MD;  Location: [facility] ORS;  Service: Anesthesiology;  Laterality: N/A;  . SHOULDER SURGERY  2003     SOCIAL HISTORY:  Social History  Socioeconomic History  . Marital status: Married    Spouse name: Not on file  . Number of children: Not on file  . Years of education: Not on file  . Highest education level: Not on file  Occupational History  . Not on file  Social Needs  . Financial resource strain: Not on  file  . Food insecurity:    Worry: Not on file    Inability: Not on file  . Transportation needs:    Medical: Not on file    Non-medical: Not on file  Tobacco Use  . Smoking status: Former Smoker    Last attempt to quit: 04/05/1995    Years since quitting: 23.0  . Smokeless tobacco: Never Used  Substance and Sexual Activity  . Alcohol use: Not Currently    Alcohol/week: 0.0 standard drinks    Comment: used to drink but stopped about 20 years ago  . Drug use: Never  . Sexual activity: Not on file  Lifestyle  . Physical activity:    Days per week: Not on file    Minutes per session: Not on file  . Stress: Not on file  Relationships  . Social connections:    Talks on phone: Not on file    Gets together: Not on file    Attends religious service: Not on file    Active member of club or organization: Not on file    Attends meetings of clubs or organizations: Not on file    Relationship status: Not on file  . Intimate partner violence:    Fear of current or ex partner: Not on file    Emotionally abused: Not on file    Physically abused: Not on file    Forced sexual activity: Not on file  Other Topics Concern  . Not on file  Social History Narrative  . Not on file    FAMILY HISTORY:  Family History  Problem Relation Age of Onset  . Diabetes Mother   . Cancer Father   . Bone cancer Father   . Colon cancer Neg Hx   . Colon polyps Neg Hx     CURRENT MEDICATIONS:  Outpatient Encounter Medications as of 04/03/2018  Medication Sig  . Alpha-Lipoic Acid 200 MG CAPS Take 1 capsule by mouth daily.  . Ascorbic Acid (VITAMIN C) 1000 MG tablet Take 1,000 mg by mouth daily.  . ASHWAGANDHA PO Take 800 mg by mouth daily.  Marland Kitchen aspirin 81 MG tablet Take 81 mg by mouth daily.  Marland Kitchen atorvastatin (LIPITOR) 10 MG tablet Take 10 mg by mouth daily.   Marland Kitchen BEE POLLEN PO Take 1 capsule by mouth daily.   . Blood Glucose Monitoring Suppl (ONE TOUCH ULTRA MINI) W/DEVICE KIT USE TO CHECK BLOOD SUGAR  DAILY.  . cholecalciferol (VITAMIN D) 1000 units tablet Take 1,000 Units by mouth daily.  Marland Kitchen CINNAMON PO Take 1,000 mg by mouth daily.  Marland Kitchen Cod Liver Oil CAPS Take 1 capsule by mouth daily.  . Coenzyme Q10 (CO Q 10) 100 MG CAPS Take 1 capsule by mouth daily.   . Cyanocobalamin (VITAMIN B-12) 5000 MCG TBDP Take 5,000 mcg by mouth daily.  . diphenoxylate-atropine (LOMOTIL) 2.5-0.025 MG tablet Take 1 tablet by mouth every 6 (six) hours as needed for diarrhea or loose stools (stomach cramps).  . Flax Oil-Fish Oil-Borage Oil (FISH OIL-FLAX OIL-BORAGE OIL) CAPS Take 1 capsule by mouth daily.  . fluorouracil CALGB 11572 in sodium chloride 0.9 % 150 mL Inject into the  vein over 96 hr.  . Garlic 5056 MG CAPS Take 1 capsule by mouth daily.  Marland Kitchen glipiZIDE (GLUCOTROL XL) 10 MG 24 hr tablet Take 10 mg by mouth daily.   . Glucosamine-Chondroit-Collagen ([employer] GLUCO-CHONDROIT PLUS UC-II PO) Take 1 tablet by mouth daily.   . IRINOTECAN HCL IV Inject into the vein.  Marland Kitchen LECITHIN PO Take 1 capsule by mouth daily.  Marland Kitchen LEUCOVORIN CALCIUM IV Inject into the vein.  Marland Kitchen lidocaine-prilocaine (EMLA) cream Apply small amount over port site and cover with plastic wrap one hour prior to appointment.  . magnesium oxide (MAG-OX) 400 MG tablet Take 400 mg by mouth daily.   . metFORMIN (GLUCOPHAGE) 500 MG tablet Take 1 tablet (500 mg total) by mouth 2 (two) times daily with a meal. (Patient taking differently: Take 500 mg by mouth daily with breakfast. )  . milk thistle 175 MG tablet Take 175 mg by mouth daily.  Marland Kitchen OVER THE COUNTER MEDICATION Take 1 capsule by mouth daily. Mushroom Complex 750 mg  . OVER THE COUNTER MEDICATION Take 1 capsule by mouth daily. Cura Med 750 mg  . oxyCODONE-acetaminophen (PERCOCET/ROXICET) 5-325 MG tablet Take 1 tablet by mouth every 6 (six) hours as needed. for pain  . prochlorperazine (COMPAZINE) 10 MG tablet Take 1 tablet (10 mg total) by mouth every 6 (six) hours as needed for nausea or vomiting.  .  valsartan-hydrochlorothiazide (DIOVAN-HCT) 160-25 MG tablet Take 1 tablet by mouth daily.   Facility-Administered Encounter Medications as of 04/03/2018  Medication  . sodium chloride flush (NS) 0.9 % injection 10 mL    ALLERGIES:  No Known Allergies   PHYSICAL EXAM:  ECOG Performance status: 1  Vitals:   04/03/18 1423  BP: 122/79  Pulse: 86  Resp: 20  Temp: 98.1 F (36.7 C)  SpO2: 98%   Filed Weights   04/03/18 1423  Weight: 215 lb (97.5 kg)    Physical Exam  Constitutional: He is oriented to person, place, and time. He appears well-developed and well-nourished.  Cardiovascular: Normal rate, regular rhythm and normal heart sounds.  Pulmonary/Chest: Effort normal and breath sounds normal.  Abdominal: Soft.  Musculoskeletal: Normal range of motion.  Neurological: He is alert and oriented to person, place, and time.  Skin: Skin is warm and dry.  Psychiatric: He has a normal mood and affect. His behavior is normal. Judgment and thought content normal.     LABORATORY DATA:  I have reviewed the labs as listed.  CBC    Component Value Date/Time   WBC 5.1 04/03/2018 1355   RBC 4.12 (L) 04/03/2018 1355   HGB 11.6 (L) 04/03/2018 1355   HCT 35.9 (L) 04/03/2018 1355   PLT 203 04/03/2018 1355   MCV 87.1 04/03/2018 1355   MCH 28.2 04/03/2018 1355   MCHC 32.3 04/03/2018 1355   RDW 13.9 04/03/2018 1355   LYMPHSABS 1.5 04/03/2018 1355   MONOABS 0.2 04/03/2018 1355   EOSABS 0.0 04/03/2018 1355   BASOSABS 0.0 04/03/2018 1355   CMP Latest Ref Rng & Units 04/03/2018 03/30/2018 03/16/2018  Glucose 70 - 99 mg/dL 387(H) 291(H) 232(H)  BUN 8 - 23 mg/dL '21 15 12  ' Creatinine 0.61 - 1.24 mg/dL 1.18 1.15 1.04  Sodium 135 - 145 mmol/L 129(L) 134(L) 136  Potassium 3.5 - 5.1 mmol/L 3.9 4.3 4.3  Chloride 98 - 111 mmol/L 93(L) 97(L) 99  CO2 22 - 32 mmol/L '26 26 29  ' Calcium 8.9 - 10.3 mg/dL 8.8(L) 9.5 9.1  Total Protein 6.5 -  8.1 g/dL 7.5 8.2(H) 8.1  Total Bilirubin 0.3 - 1.2 mg/dL  0.8 0.7 0.6  Alkaline Phos 38 - 126 U/L 113 128(H) 131(H)  AST 15 - 41 U/L '15 30 20  ' ALT 0 - 44 U/L '20 24 22      ' I have reviewed Francene Finders, NP's note and agree with the documentation.  I personally performed a face-to-face visit, made revisions and my assessment and plan is as follows.     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  He tolerated it very well.  CT CAP showed stable left upper lobe subcentimeter lung nodule, segment 4 lesion in the liver has decreased to 2.4 cm.  -He was evaluated by Dr. Anselm Pancoast, who thought microwave thermal ablation would be the best procedure for him.  He would like to save  Y 90 for later recurrences.  An MRI of the liver dated 11/21/2017 showed left hepatic lobe lesion measuring 2.6 x 2.1 cm.  No other lesions were seen. -He underwent left hepatic lobe microwave ablation on 12/10/2017.  He did very well with minimal pain and complications.  He is able to do all his day-to-day activities without any problems. - Last CEA of went up to 19.9.  He is completely asymptomatic. -CT scan of the abdomen pelvis on 02/05/2018 showed left hepatic lesion with central high attenuation and a large area of low-attenuation.  There is a new left hepatic lobe lesion measuring 1.3 cm.  CT chest showed similar appearing small nodules. - MRI of the liver dated 02/25/2018 shows heterogeneous mass which surrounds the right side of the ablation defect and extends inferiorly in segment 4B measuring 6.9 x 5.6 cm, segment 2 lesion measuring 2.6 cm, segment 8 subcapsular 1.6 cm lesion.  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 - He has at least 3 new lesions and questionable capsular meta stasis.  I do not believe he is a candidate for local therapy.  I have  recommended going back on chemotherapy.  His last chemo was in July 2019, dose reduced FOLFIRI. - FOLFIRI  started back on 03/02/2018.  He tolerated it very well. - Cycle 3 chemotherapy on 03/30/2018. -he was seen on unscheduled visit today.  Starting 04/01/2018 evening, he has been experiencing fever of 100.5 with chills.  He had it last night also.  Fever and chills are only at nighttime.  He had mild nausea but denied any vomiting. -He is also experiencing slight dysuria.  No suprapubic pain reported. -I have done lab work in my office today.  CBC was within normal limits.  UA was normal.  He has mild hyponatremia. -He was encouraged to drink a lot of fluids.  He has a follow-up appointment with me.  2.  Diabetes: He is continuing metformin 500 mg daily and glipizide once daily.  He is also continuing Lipitor.  3.  Peripheral neuropathy: He has mild numbness in the feet, predominantly at bedtime.  This is likely from his diabetes.  He never received oxaliplatin based chemotherapy.  4.  Hypertension: He will continue valsartan HCTZ 160-38m daily.      Orders placed this encounter:  No orders of the defined types were placed in this encounter.     SDerek Jack MD [phone]

## 2018-04-03 NOTE — Assessment & Plan Note (Signed)
1.  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  He tolerated it very well.  CT CAP showed stable left upper lobe subcentimeter lung nodule, segment 4 lesion in the liver has decreased to 2.4 cm.  -He was evaluated by Dr. Anselm Pancoast, who thought microwave thermal ablation would be the best procedure for him.  He would like to save  Y 90 for later recurrences.  An MRI of the liver dated 11/21/2017 showed left hepatic lobe lesion measuring 2.6 x 2.1 cm.  No other lesions were seen. -He underwent left hepatic lobe microwave ablation on 12/10/2017.  He did very well with minimal pain and complications.  He is able to do all his day-to-day activities without any problems. - Last CEA of went up to 19.9.  He is completely asymptomatic. -CT scan of the abdomen pelvis on 02/05/2018 showed left hepatic lesion with central high attenuation and a large area of low-attenuation.  There is a new left hepatic lobe lesion measuring 1.3 cm.  CT chest showed similar appearing small nodules. - MRI of the liver dated 02/25/2018 shows heterogeneous mass which surrounds the right side of the ablation defect and extends inferiorly in segment 4B measuring 6.9 x 5.6 cm, segment 2 lesion measuring 2.6 cm, segment 8 subcapsular 1.6 cm lesion.  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 - He has at least 3 new lesions and questionable capsular meta stasis.  I do not believe he is a candidate for local therapy.  I have recommended going back on chemotherapy.  His last chemo was in July 2019, dose reduced FOLFIRI. - FOLFIRI  started back on 03/02/2018.  He tolerated it very well. - Cycle 3 chemotherapy on 03/30/2018. -he was seen on unscheduled visit today.  Starting 04/01/2018 evening, he has been experiencing fever of 100.5 with chills.  He had it last night also.  Fever and chills are only at  nighttime.  He had mild nausea but denied any vomiting. -He is also experiencing slight dysuria.  No suprapubic pain reported. -I have done lab work in my office today.  CBC was within normal limits.  UA was normal.  He has mild hyponatremia. -He was encouraged to drink a lot of fluids.  He has a follow-up appointment with me.  2.  Diabetes: He is continuing metformin 500 mg daily and glipizide once daily.  He is also continuing Lipitor.  3.  Peripheral neuropathy: He has mild numbness in the feet, predominantly at bedtime.  This is likely from his diabetes.  He never received oxaliplatin based chemotherapy.  4.  Hypertension: He will continue valsartan HCTZ 160-16m daily.

## 2018-04-05 LAB — URINE CULTURE: Culture: NO GROWTH

## 2018-04-13 ENCOUNTER — Inpatient Hospital Stay (HOSPITAL_COMMUNITY): Payer: Medicare PPO

## 2018-04-13 ENCOUNTER — Encounter (HOSPITAL_COMMUNITY): Payer: Self-pay | Admitting: Hematology

## 2018-04-13 ENCOUNTER — Inpatient Hospital Stay (HOSPITAL_BASED_OUTPATIENT_CLINIC_OR_DEPARTMENT_OTHER): Payer: Medicare PPO | Admitting: Hematology

## 2018-04-13 VITALS — BP 131/80 | HR 76 | Temp 98.3°F | Resp 16

## 2018-04-13 DIAGNOSIS — R51 Headache: Secondary | ICD-10-CM | POA: Diagnosis not present

## 2018-04-13 DIAGNOSIS — I10 Essential (primary) hypertension: Secondary | ICD-10-CM

## 2018-04-13 DIAGNOSIS — Z5111 Encounter for antineoplastic chemotherapy: Secondary | ICD-10-CM | POA: Diagnosis not present

## 2018-04-13 DIAGNOSIS — C189 Malignant neoplasm of colon, unspecified: Secondary | ICD-10-CM

## 2018-04-13 DIAGNOSIS — C787 Secondary malignant neoplasm of liver and intrahepatic bile duct: Principal | ICD-10-CM

## 2018-04-13 DIAGNOSIS — E1142 Type 2 diabetes mellitus with diabetic polyneuropathy: Secondary | ICD-10-CM

## 2018-04-13 DIAGNOSIS — Z87891 Personal history of nicotine dependence: Secondary | ICD-10-CM | POA: Diagnosis not present

## 2018-04-13 DIAGNOSIS — C7800 Secondary malignant neoplasm of unspecified lung: Secondary | ICD-10-CM | POA: Diagnosis not present

## 2018-04-13 DIAGNOSIS — R202 Paresthesia of skin: Secondary | ICD-10-CM

## 2018-04-13 DIAGNOSIS — Z7984 Long term (current) use of oral hypoglycemic drugs: Secondary | ICD-10-CM | POA: Diagnosis not present

## 2018-04-13 DIAGNOSIS — Z452 Encounter for adjustment and management of vascular access device: Secondary | ICD-10-CM | POA: Diagnosis not present

## 2018-04-13 LAB — CBC WITH DIFFERENTIAL/PLATELET
Abs Immature Granulocytes: 0.04 10*3/uL (ref 0.00–0.07)
BASOS PCT: 1 %
Basophils Absolute: 0 10*3/uL (ref 0.0–0.1)
Eosinophils Absolute: 0.2 10*3/uL (ref 0.0–0.5)
Eosinophils Relative: 2 %
HCT: 37.5 % — ABNORMAL LOW (ref 39.0–52.0)
Hemoglobin: 11.8 g/dL — ABNORMAL LOW (ref 13.0–17.0)
Immature Granulocytes: 1 %
Lymphocytes Relative: 20 %
Lymphs Abs: 1.3 10*3/uL (ref 0.7–4.0)
MCH: 27.6 pg (ref 26.0–34.0)
MCHC: 31.5 g/dL (ref 30.0–36.0)
MCV: 87.8 fL (ref 80.0–100.0)
Monocytes Absolute: 0.5 10*3/uL (ref 0.1–1.0)
Monocytes Relative: 8 %
Neutro Abs: 4.7 10*3/uL (ref 1.7–7.7)
Neutrophils Relative %: 68 %
PLATELETS: 265 10*3/uL (ref 150–400)
RBC: 4.27 MIL/uL (ref 4.22–5.81)
RDW: 14.1 % (ref 11.5–15.5)
WBC: 6.8 10*3/uL (ref 4.0–10.5)
nRBC: 0 % (ref 0.0–0.2)

## 2018-04-13 LAB — COMPREHENSIVE METABOLIC PANEL
ALT: 20 U/L (ref 0–44)
AST: 23 U/L (ref 15–41)
Albumin: 3.8 g/dL (ref 3.5–5.0)
Alkaline Phosphatase: 145 U/L — ABNORMAL HIGH (ref 38–126)
Anion gap: 11 (ref 5–15)
BUN: 13 mg/dL (ref 8–23)
CHLORIDE: 96 mmol/L — AB (ref 98–111)
CO2: 28 mmol/L (ref 22–32)
Calcium: 9.5 mg/dL (ref 8.9–10.3)
Creatinine, Ser: 1.04 mg/dL (ref 0.61–1.24)
GFR calc Af Amer: >60 mL/min (ref >60)
GFR calc non Af Amer: >60 mL/min (ref >60)
Glucose, Bld: 354 mg/dL — ABNORMAL HIGH (ref 70–99)
Potassium: 4.4 mmol/L (ref 3.5–5.1)
Sodium: 135 mmol/L (ref 135–145)
Total Bilirubin: 0.6 mg/dL (ref 0.3–1.2)
Total Protein: 7.6 g/dL (ref 6.5–8.1)

## 2018-04-13 LAB — MAGNESIUM: Magnesium: 1.9 mg/dL (ref 1.7–2.4)

## 2018-04-13 MED ORDER — SODIUM CHLORIDE 0.9 % IV SOLN
Freq: Once | INTRAVENOUS | Status: AC
  Administered 2018-04-13: 11:00:00 via INTRAVENOUS

## 2018-04-13 MED ORDER — PALONOSETRON HCL INJECTION 0.25 MG/5ML
0.2500 mg | Freq: Once | INTRAVENOUS | Status: AC
  Administered 2018-04-13: 0.25 mg via INTRAVENOUS

## 2018-04-13 MED ORDER — SODIUM CHLORIDE 0.9 % IV SOLN
10.0000 mg | Freq: Once | INTRAVENOUS | Status: AC
  Administered 2018-04-13: 10 mg via INTRAVENOUS
  Filled 2018-04-13: qty 1

## 2018-04-13 MED ORDER — SODIUM CHLORIDE 0.9 % IV SOLN
1920.0000 mg/m2 | INTRAVENOUS | Status: DC
  Administered 2018-04-13: 4250 mg via INTRAVENOUS
  Filled 2018-04-13: qty 85

## 2018-04-13 MED ORDER — PALONOSETRON HCL INJECTION 0.25 MG/5ML
INTRAVENOUS | Status: AC
  Filled 2018-04-13: qty 5

## 2018-04-13 MED ORDER — SODIUM CHLORIDE 0.9 % IV SOLN
INTRAVENOUS | Status: DC
  Administered 2018-04-13: 11:00:00 via INTRAVENOUS

## 2018-04-13 MED ORDER — SODIUM CHLORIDE 0.9% FLUSH
10.0000 mL | INTRAVENOUS | Status: DC | PRN
  Administered 2018-04-13: 10 mL
  Filled 2018-04-13: qty 10

## 2018-04-13 MED ORDER — ATROPINE SULFATE 1 MG/ML IJ SOLN
0.5000 mg | Freq: Once | INTRAMUSCULAR | Status: AC
  Administered 2018-04-13: 0.5 mg via INTRAVENOUS

## 2018-04-13 MED ORDER — ATROPINE SULFATE 1 MG/ML IJ SOLN
INTRAMUSCULAR | Status: AC
  Filled 2018-04-13: qty 1

## 2018-04-13 MED ORDER — LEUCOVORIN CALCIUM INJECTION 350 MG
900.0000 mg | Freq: Once | INTRAVENOUS | Status: AC
  Administered 2018-04-13: 900 mg via INTRAVENOUS
  Filled 2018-04-13: qty 10

## 2018-04-13 MED ORDER — FLUOROURACIL CHEMO INJECTION 2.5 GM/50ML
320.0000 mg/m2 | Freq: Once | INTRAVENOUS | Status: AC
  Administered 2018-04-13: 700 mg via INTRAVENOUS
  Filled 2018-04-13: qty 14

## 2018-04-13 MED ORDER — IRINOTECAN HCL CHEMO INJECTION 100 MG/5ML
144.0000 mg/m2 | Freq: Once | INTRAVENOUS | Status: AC
  Administered 2018-04-13: 320 mg via INTRAVENOUS
  Filled 2018-04-13: qty 15

## 2018-04-13 NOTE — Patient Instructions (Signed)
[facility] Discharge Instructions for Patients Receiving Chemotherapy   Beginning January 23rd 2017 lab work for the [facility] will be done in the  Main lab at [facility] on 1st floor. If you have a lab appointment with the [facility] please come in thru the  Main Entrance and check in at the main information desk   Today you received the following chemotherapy agents Leucovorin, Irinitecan, and 5FU  To help prevent nausea and vomiting after your treatment, we encourage you to take your nausea medication   If you develop nausea and vomiting, or diarrhea that is not controlled by your medication, call the clinic.  The clinic phone number is [phone]. Office hours are Monday-Friday 8:30am-5:00pm.  BELOW ARE SYMPTOMS THAT SHOULD BE REPORTED IMMEDIATELY:  *FEVER GREATER THAN 101.0 F  *CHILLS WITH OR WITHOUT FEVER  NAUSEA AND VOMITING THAT IS NOT CONTROLLED WITH YOUR NAUSEA MEDICATION  *UNUSUAL SHORTNESS OF BREATH  *UNUSUAL BRUISING OR BLEEDING  TENDERNESS IN MOUTH AND THROAT WITH OR WITHOUT PRESENCE OF ULCERS  *URINARY PROBLEMS  *BOWEL PROBLEMS  UNUSUAL RASH Items with * indicate a potential emergency and should be followed up as soon as possible. If you have an emergency after office hours please contact your primary care physician or go to the nearest emergency department.  Please call the clinic during office hours if you have any questions or concerns.   You may also contact the Patient Navigator at [phone] should you have any questions or need assistance in obtaining follow up care.      Resources For Cancer Patients and their Caregivers ? American Cancer Society: Can assist with transportation, wigs, general needs, runs Look Good Feel Better.        [phone] ? Cancer Care: Provides financial assistance, online support groups, medication/co-pay assistance.  1-800-813-HOPE 702-164-5426) ? [facility] Assists [place] cancer patients and their families through emotional , educational and financial support.  (267)353-6402 ? [place] DSS Where to apply for food stamps, Medicaid and utility assistance. (986)455-9764 ? [facility]: Transportation to medical appointments. 509-234-5212 ? Social Security Administration: May apply for disability if have a Stage IV cancer. 774-785-1993 4386022816 ? [employer], Disability and Transit Services: Assists with nutrition, care and transit needs. ([phone]

## 2018-04-13 NOTE — Assessment & Plan Note (Signed)
1.  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  He tolerated it very well.  CT CAP showed stable left upper lobe subcentimeter lung nodule, segment 4 lesion in the liver has decreased to 2.4 cm.  -He was evaluated by Dr. Anselm Pancoast, who thought microwave thermal ablation would be the best procedure for him.  He would like to save  Y 90 for later recurrences.  An MRI of the liver dated 11/21/2017 showed left hepatic lobe lesion measuring 2.6 x 2.1 cm.  No other lesions were seen. -He underwent left hepatic lobe microwave ablation on 12/10/2017.  He did very well with minimal pain and complications.  He is able to do all his day-to-day activities without any problems. - Last CEA of went up to 19.9.  He is completely asymptomatic. -CT scan of the abdomen pelvis on 02/05/2018 showed left hepatic lesion with central high attenuation and a large area of low-attenuation.  There is a new left hepatic lobe lesion measuring 1.3 cm.  CT chest showed similar appearing small nodules. - MRI of the liver dated 02/25/2018 shows heterogeneous mass which surrounds the right side of the ablation defect and extends inferiorly in segment 4B measuring 6.9 x 5.6 cm, segment 2 lesion measuring 2.6 cm, segment 8 subcapsular 1.6 cm lesion.  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 - He has at least 3 new lesions and questionable capsular metastasis.  I do not believe he is a candidate for local therapy.  I have recommended going back on chemotherapy.  His last chemo was in July 2019, dose reduced FOLFIRI. - FOLFIRI  started back on 03/02/2018.  He tolerated it very well. - Cycle 3 chemotherapy was on 03/30/2018.  He did have a history of rectal bleed with Avastin.  Hence it was not added. - He had developed fever on 04/01/2018.  We have done work-up in our office.  It came back negative.  He  did not have any subsequent fevers. - We reviewed his blood work.  He feels fine today.  His blood counts are adequate to proceed with cycle 4 today. - I plan to do scans after cycle 6.  I will see him back in a month for follow-up.  2.  Diabetes: -he will continue metformin 500 mg daily and glipizide daily.  We will also continue Lipitor.   3.  Peripheral neuropathy: -He has mild numbness in the feet predominantly at bedtime. -This is likely from diabetes.  He never received oxaliplatin based chemotherapy.  4.  Hypertension: He will continue valsartan HCTZ 160-60m daily.

## 2018-04-13 NOTE — Progress Notes (Signed)
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 ([place])   05/28/2017 -  Chemotherapy    The patient had palonosetron (ALOXI) injection 0.25 mg, 0.25 mg, Intravenous,  Once, 15 of 19 cycles Administration: 0.25 mg (09/03/2017), 0.25 mg (10/01/2017), 0.25 mg (10/15/2017), 0.25 mg (10/29/2017), 0.25 mg (09/17/2017), 0.25 mg (03/02/2018), 0.25 mg (03/16/2018), 0.25 mg (03/30/2018) irinotecan (CAMPTOSAR) 400 mg in dextrose 5 % 500 mL chemo infusion, 180 mg/m2, Intravenous,  Once, 15 of 19 cycles Dose modification: 144 mg/m2 (Cycle 4, Reason: Provider Judgment) Administration: 320 mg (09/03/2017), 320 mg (10/01/2017), 320 mg (10/15/2017), 320 mg (10/29/2017), 320 mg (09/17/2017), 320 mg (03/02/2018), 320 mg (03/16/2018), 320 mg (03/30/2018) leucovorin 888 mg in dextrose 5 % 250 mL infusion, 400 mg/m2, Intravenous,  Once, 15 of 19 cycles Administration: 900 mg (09/03/2017), 900 mg (10/01/2017), 900 mg (10/15/2017), 900 mg (10/29/2017), 900 mg (09/17/2017), 900 mg (03/02/2018), 900 mg (03/16/2018), 900 mg (03/30/2018) fluorouracil (ADRUCIL) chemo injection 900 mg, 400 mg/m2, Intravenous,  Once, 15 of 19 cycles Dose modification: 320 mg/m2 (Cycle 4, Reason: Provider Judgment) Administration: 700 mg (09/03/2017), 700 mg (10/01/2017), 700 mg (10/15/2017), 700 mg (10/29/2017), 700 mg (09/17/2017), 700 mg (03/02/2018), 700 mg (03/16/2018), 700 mg (03/30/2018) fluorouracil (ADRUCIL) 5,350 mg in sodium chloride 0.9 % 143 mL chemo infusion, 2,400 mg/m2, Intravenous, 1 Day/Dose, 15 of 19 cycles Dose modification: 1,920 mg/m2 (Cycle 4, Reason: Provider Judgment) Administration: 4,250 mg (09/03/2017), 4,250 mg (10/01/2017), 4,250 mg  (10/15/2017), 4,250 mg (10/29/2017), 4,250 mg (09/17/2017), 4,250 mg (03/02/2018), 4,250 mg (03/16/2018), 4,250 mg (03/30/2018)  for chemotherapy treatment.     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 male returns for routine follow-up metastatic colon cancer to the liver. He is here today and doing well. He does report some numbness and tinging in his toes. It is not constant it comes and goes. He has occasional headaches. He reports his fever only lasted that one day after treatment and it went away and didn't come back. He denies any nausea, vomiting, or diarrhea. He denies any recent infections. Denies any bleeding or easy bruising. He reports his appetite at 100% and his energy level at 75%. He is maintaining his weight well at this time.    REVIEW OF SYSTEMS:  Review of Systems  Neurological: Positive for headaches and numbness.  All other systems reviewed and are negative.    PAST MEDICAL/SURGICAL HISTORY:  Past Medical History:  Diagnosis Date  . Colon cancer (Christopher Friedman)   . Diabetes ([place])   . GERD (gastroesophageal reflux disease)   . Hypertension    Past Surgical History:  Procedure Laterality Date  . APPENDECTOMY     during colon surgery  . COLON SURGERY    . IR RADIOLOGIST EVAL & MGMT  11/26/2017  . RADIOFREQUENCY ABLATION N/A 12/10/2017   Procedure: CT MICROWAVE THERMAL ABLATION (LIVER);  Surgeon: Christopher Daft, MD;  Location: [facility] ORS;  Service: Anesthesiology;  Laterality: N/A;  . SHOULDER SURGERY  2003     SOCIAL HISTORY:  Social History   Socioeconomic History  . Marital status: Married    Spouse name: Not on file  . Number of children: Not on file  .  Years of education: Not on file  . Highest education level: Not on file  Occupational History  . Not on file  Social Needs  . Financial resource strain: Not on file  . Food insecurity:    Worry: Not on file    Inability: Not on file  . Transportation needs:    Medical: Not on file    Non-medical: Not on file  Tobacco Use  . Smoking status: Former Smoker    Last attempt to quit: 04/05/1995    Years since quitting: 23.0  . Smokeless tobacco: Never Used  Substance and Sexual Activity  . Alcohol use: Not Currently    Alcohol/week: 0.0 standard drinks    Comment: used to drink but stopped about 20 years ago  . Drug use: Never  . Sexual activity: Not on file  Lifestyle  . Physical activity:    Days per week: Not on file    Minutes per session: Not on file  . Stress: Not on file  Relationships  . Social connections:    Talks on phone: Not on file    Gets together: Not on file    Attends religious service: Not on file    Active member of club or organization: Not on file    Attends meetings of clubs or organizations: Not on file    Relationship status: Not on file  . Intimate partner violence:    Fear of current or ex partner: Not on file    Emotionally abused: Not on file    Physically abused: Not on file    Forced sexual activity: Not on file  Other Topics Concern  . Not on file  Social History Narrative  . Not on file    FAMILY HISTORY:  Family History  Problem Relation Age of Onset  . Diabetes Mother   . Cancer Father   . Bone cancer Father   . Colon cancer Neg Hx   . Colon polyps Neg Hx     CURRENT MEDICATIONS:  Outpatient Encounter Medications as of 04/13/2018  Medication Sig  . Alpha-Lipoic Acid 200 MG CAPS Take 1 capsule by mouth daily.  . Ascorbic Acid (VITAMIN C) 1000 MG tablet Take 1,000 mg by mouth daily.  . ASHWAGANDHA PO Take 800 mg by mouth daily.  Marland Kitchen aspirin 81 MG tablet Take 81 mg by mouth daily.  Marland Kitchen atorvastatin (LIPITOR) 10 MG tablet Take 10 mg  by mouth daily.   Marland Kitchen BEE POLLEN PO Take 1 capsule by mouth daily.   . Blood Glucose Monitoring Suppl (ONE TOUCH ULTRA MINI) W/DEVICE KIT USE TO CHECK BLOOD SUGAR DAILY.  . cholecalciferol (VITAMIN D) 1000 units tablet Take 1,000 Units by mouth daily.  Marland Kitchen CINNAMON PO Take 1,000 mg by mouth daily.  Marland Kitchen Cod Liver Oil CAPS Take 1 capsule by mouth daily.  . Coenzyme Q10 (CO Q 10) 100 MG CAPS Take 1 capsule by mouth daily.   . Cyanocobalamin (VITAMIN B-12) 5000 MCG TBDP Take 5,000 mcg by mouth daily.  . diphenoxylate-atropine (LOMOTIL) 2.5-0.025 MG tablet Take 1 tablet by mouth every 6 (six) hours as needed for diarrhea or loose stools (stomach cramps).  Marland Kitchen  Flax Oil-Fish Oil-Borage Oil (FISH OIL-FLAX OIL-BORAGE OIL) CAPS Take 1 capsule by mouth daily.  . fluorouracil CALGB 36644 in sodium chloride 0.9 % 150 mL Inject into the vein over 96 hr.  . Garlic 0347 MG CAPS Take 1 capsule by mouth daily.  Marland Kitchen glipiZIDE (GLUCOTROL XL) 10 MG 24 hr tablet Take 10 mg by mouth daily.   . Glucosamine-Chondroit-Collagen ([employer] GLUCO-CHONDROIT PLUS UC-II PO) Take 1 tablet by mouth daily.   . IRINOTECAN HCL IV Inject into the vein.  Marland Kitchen LECITHIN PO Take 1 capsule by mouth daily.  Marland Kitchen LEUCOVORIN CALCIUM IV Inject into the vein.  Marland Kitchen lidocaine-prilocaine (EMLA) cream Apply small amount over port site and cover with plastic wrap one hour prior to appointment.  . magnesium oxide (MAG-OX) 400 MG tablet Take 400 mg by mouth daily.   . metFORMIN (GLUCOPHAGE) 500 MG tablet Take 1 tablet (500 mg total) by mouth 2 (two) times daily with a meal. (Patient taking differently: Take 500 mg by mouth daily with breakfast. )  . milk thistle 175 MG tablet Take 175 mg by mouth daily.  Marland Kitchen OVER THE COUNTER MEDICATION Take 1 capsule by mouth daily. Mushroom Complex 750 mg  . OVER THE COUNTER MEDICATION Take 1 capsule by mouth daily. Cura Med 750 mg  . oxyCODONE-acetaminophen (PERCOCET/ROXICET) 5-325 MG tablet Take 1 tablet by mouth every 6 (six) hours as  needed. for pain  . prochlorperazine (COMPAZINE) 10 MG tablet Take 1 tablet (10 mg total) by mouth every 6 (six) hours as needed for nausea or vomiting.  . valsartan-hydrochlorothiazide (DIOVAN-HCT) 160-25 MG tablet Take 1 tablet by mouth daily.   Facility-Administered Encounter Medications as of 04/13/2018  Medication  . sodium chloride flush (NS) 0.9 % injection 10 mL    ALLERGIES:  No Known Allergies   PHYSICAL EXAM:  ECOG Performance status: 1  Vitals:   04/13/18 0930  BP: 109/77  Pulse: 86  Resp: 16  Temp: 98 F (36.7 C)  SpO2: 99%   Filed Weights   04/13/18 0930  Weight: 213 lb 8 oz (96.8 kg)    Physical Exam Constitutional:      Appearance: Normal appearance. He is normal weight.  Cardiovascular:     Rate and Rhythm: Normal rate and regular rhythm.     Heart sounds: Normal heart sounds.  Pulmonary:     Effort: Pulmonary effort is normal.     Breath sounds: Normal breath sounds.  Musculoskeletal: Normal range of motion.  Skin:    General: Skin is warm and dry.  Neurological:     Mental Status: He is alert and oriented to person, place, and time. Mental status is at baseline.  Psychiatric:        Mood and Affect: Mood normal.        Behavior: Behavior normal.        Thought Content: Thought content normal.        Judgment: Judgment normal.      LABORATORY DATA:  I have reviewed the labs as listed.  CBC    Component Value Date/Time   WBC 6.8 04/13/2018 0918   RBC 4.27 04/13/2018 0918   HGB 11.8 (L) 04/13/2018 0918   HCT 37.5 (L) 04/13/2018 0918   PLT 265 04/13/2018 0918   MCV 87.8 04/13/2018 0918   MCH 27.6 04/13/2018 0918   MCHC 31.5 04/13/2018 0918   RDW 14.1 04/13/2018 0918   LYMPHSABS 1.3 04/13/2018 0918   MONOABS 0.5 04/13/2018 0918   EOSABS 0.2  04/13/2018 0918   BASOSABS 0.0 04/13/2018 0918   CMP Latest Ref Rng & Units 04/13/2018 04/03/2018 03/30/2018  Glucose 70 - 99 mg/dL 354(H) 387(H) 291(H)  BUN 8 - 23 mg/dL '13 21 15  ' Creatinine  0.61 - 1.24 mg/dL 1.04 1.18 1.15  Sodium 135 - 145 mmol/L 135 129(L) 134(L)  Potassium 3.5 - 5.1 mmol/L 4.4 3.9 4.3  Chloride 98 - 111 mmol/L 96(L) 93(L) 97(L)  CO2 22 - 32 mmol/L '28 26 26  ' Calcium 8.9 - 10.3 mg/dL 9.5 8.8(L) 9.5  Total Protein 6.5 - 8.1 g/dL 7.6 7.5 8.2(H)  Total Bilirubin 0.3 - 1.2 mg/dL 0.6 0.8 0.7  Alkaline Phos 38 - 126 U/L 145(H) 113 128(H)  AST 15 - 41 U/L '23 15 30  ' ALT 0 - 44 U/L '20 20 24       ' DIAGNOSTIC IMAGING:  I have independently reviewed the scans and discussed with the patient.   I have reviewed Francene Finders, NP's note and agree with the documentation.  I personally performed a face-to-face visit, made revisions and my assessment and plan is as follows.    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  He tolerated it very well.  CT CAP showed stable left upper lobe subcentimeter lung nodule, segment 4 lesion in the liver has decreased to 2.4 cm.  -He was evaluated by Dr. Anselm Pancoast, who thought microwave thermal ablation would be the best procedure for him.  He would like to save  Y 90 for later recurrences.  An MRI of the liver dated 11/21/2017 showed left hepatic lobe lesion measuring 2.6 x 2.1 cm.  No other lesions were seen. -He underwent left hepatic lobe microwave ablation on 12/10/2017.  He did very well with minimal pain and complications.  He is able to do all his day-to-day activities without any problems. - Last CEA of went up to 19.9.  He is completely asymptomatic. -CT scan of the abdomen pelvis on 02/05/2018 showed left hepatic lesion with central high attenuation and a large area of low-attenuation.  There is a new left hepatic lobe lesion measuring 1.3 cm.  CT chest showed similar appearing small nodules. - MRI of the liver dated 02/25/2018 shows heterogeneous mass which  surrounds the right side of the ablation defect and extends inferiorly in segment 4B measuring 6.9 x 5.6 cm, segment 2 lesion measuring 2.6 cm, segment 8 subcapsular 1.6 cm lesion.  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 - He has at least 3 new lesions and questionable capsular metastasis.  I do not believe he is a candidate for local therapy.  I have recommended going back on chemotherapy.  His last chemo was in July 2019, dose reduced FOLFIRI. - FOLFIRI  started back on 03/02/2018.  He tolerated it very well. - Cycle 3 chemotherapy was on 03/30/2018.  He did have a history of rectal bleed with Avastin.  Hence it was not added. - He had developed fever on 04/01/2018.  We have done work-up in our office.  It came back negative.  He did not have any subsequent fevers. - We reviewed his blood work.  He feels fine today.  His blood counts are adequate to proceed with cycle 4 today. - I plan to do scans after cycle 6.  I  will see him back in a month for follow-up.  2.  Diabetes: -he will continue metformin 500 mg daily and glipizide daily.  We will also continue Lipitor.   3.  Peripheral neuropathy: -He has mild numbness in the feet predominantly at bedtime. -This is likely from diabetes.  He never received oxaliplatin based chemotherapy.  4.  Hypertension: He will continue valsartan HCTZ 160-50m daily.      Orders placed this encounter:  No orders of the defined types were placed in this encounter.     SDerek Jack MD [id number] [phone]

## 2018-04-13 NOTE — Patient Instructions (Addendum)
[facility] at [facility] Discharge Instructions  Follow up in 4 weeks with labs and treatment.    Thank you for choosing [facility] at [facility] to provide your oncology and hematology care.  To afford each patient quality time with our provider, please arrive at least 15 minutes before your scheduled appointment time.   If you have a lab appointment with the Cancer Center please come in thru the  Main Entrance and check in at the main information desk  You need to re-schedule your appointment should you arrive 10 or more minutes late.  We strive to give you quality time with our providers, and arriving late affects you and other patients whose appointments are after yours.  Also, if you no show three or more times for appointments you may be dismissed from the clinic at the providers discretion.     Again, thank you for choosing [facility].  Our hope is that these requests will decrease the amount of time that you wait before being seen by our physicians.       _____________________________________________________________  Should you have questions after your visit to [facility], please contact our office at [phone] between the hours of 8:00 a.m. and 4:30 p.m.  Voicemails left after 4:00 p.m. will not be returned until the following business day.  For prescription refill requests, have your pharmacy contact our office and allow 72 hours.    Cancer Center Support Programs:   > Cancer Support Group  2nd Tuesday of the month 1pm-2pm, Journey Room    

## 2018-04-13 NOTE — Progress Notes (Signed)
1020 labs reviewed and pt seen by Dr. Delton Coombes who approved pt for FOLFIRI today.   Christopher Friedman tolerated FOLFIRI without incident or complaint. VSS. 5FU infusing through home infusion pump without incident. Discharged self ambulatory in satisfactory condition.

## 2018-04-14 LAB — CEA: CEA: 271 ng/mL — ABNORMAL HIGH (ref 0.0–4.7)

## 2018-04-15 ENCOUNTER — Inpatient Hospital Stay (HOSPITAL_COMMUNITY): Payer: Medicare PPO

## 2018-04-15 VITALS — BP 114/75 | HR 82 | Temp 97.8°F | Resp 18

## 2018-04-15 DIAGNOSIS — Z87891 Personal history of nicotine dependence: Secondary | ICD-10-CM | POA: Diagnosis not present

## 2018-04-15 DIAGNOSIS — C189 Malignant neoplasm of colon, unspecified: Secondary | ICD-10-CM

## 2018-04-15 DIAGNOSIS — Z452 Encounter for adjustment and management of vascular access device: Secondary | ICD-10-CM | POA: Diagnosis not present

## 2018-04-15 DIAGNOSIS — C787 Secondary malignant neoplasm of liver and intrahepatic bile duct: Secondary | ICD-10-CM | POA: Diagnosis not present

## 2018-04-15 DIAGNOSIS — E1142 Type 2 diabetes mellitus with diabetic polyneuropathy: Secondary | ICD-10-CM | POA: Diagnosis not present

## 2018-04-15 DIAGNOSIS — C7800 Secondary malignant neoplasm of unspecified lung: Secondary | ICD-10-CM | POA: Diagnosis not present

## 2018-04-15 DIAGNOSIS — Z7984 Long term (current) use of oral hypoglycemic drugs: Secondary | ICD-10-CM | POA: Diagnosis not present

## 2018-04-15 DIAGNOSIS — Z5111 Encounter for antineoplastic chemotherapy: Secondary | ICD-10-CM | POA: Diagnosis not present

## 2018-04-15 DIAGNOSIS — I10 Essential (primary) hypertension: Secondary | ICD-10-CM | POA: Diagnosis not present

## 2018-04-15 MED ORDER — SODIUM CHLORIDE 0.9% FLUSH
10.0000 mL | INTRAVENOUS | Status: DC | PRN
  Administered 2018-04-15: 10 mL
  Filled 2018-04-15: qty 10

## 2018-04-15 MED ORDER — HEPARIN SOD (PORK) LOCK FLUSH 100 UNIT/ML IV SOLN
500.0000 [IU] | Freq: Once | INTRAVENOUS | Status: AC | PRN
  Administered 2018-04-15: 500 [IU]

## 2018-04-15 NOTE — Patient Instructions (Signed)
[place] at [facility] Discharge Instructions  Disconnected his pump today.  Follow up as scheduled.   Thank you for choosing [place] at [facility] to provide your oncology and hematology care.  To afford each patient quality time with our provider, please arrive at least 15 minutes before your scheduled appointment time.   If you have a lab appointment with the [facility] please come in thru the  Main Entrance and check in at the main information desk  You need to re-schedule your appointment should you arrive 10 or more minutes late.  We strive to give you quality time with our providers, and arriving late affects you and other patients whose appointments are after yours.  Also, if you no show three or more times for appointments you may be dismissed from the clinic at the providers discretion.     Again, thank you for choosing [facility].  Our hope is that these requests will decrease the amount of time that you wait before being seen by our physicians.       _____________________________________________________________  Should you have questions after your visit to [facility], please contact our office at [phone] between the hours of 8:00 a.m. and 4:30 p.m.  Voicemails left after 4:00 p.m. will not be returned until the following business day.  For prescription refill requests, have your pharmacy contact our office and allow 72 hours.    Cancer Center Support Programs:   > Cancer Support Group  2nd Tuesday of the month 1pm-2pm, Journey Room

## 2018-04-15 NOTE — Progress Notes (Signed)
Norvin Ohlin returns today for port de access and flush after 46 hr continous infusion of 58fu. Tolerated infusion without problems. Portacath located right chest wall was  deaccessed and flushed with 37ml NS and 500U/25ml Heparin and needle removed intact.  Procedure without incident. Patient tolerated procedure well.   Vitals stable and discharged home from clinic ambulatory. Follow up as scheduled.

## 2018-05-01 ENCOUNTER — Telehealth (HOSPITAL_COMMUNITY): Payer: Self-pay

## 2018-05-01 NOTE — Telephone Encounter (Signed)
Nutrition  Patient identified on Malnutrition Screening report for weight loss and poor appetite  Called patient and left message on voicemail to return call.  Christopher Friedman B. Zenia Resides, [place], [place] Registered Dietitian [phone] (pager)

## 2018-05-02 DIAGNOSIS — C189 Malignant neoplasm of colon, unspecified: Secondary | ICD-10-CM | POA: Diagnosis not present

## 2018-05-04 ENCOUNTER — Inpatient Hospital Stay (HOSPITAL_COMMUNITY): Payer: Medicare PPO | Attending: Hematology

## 2018-05-04 ENCOUNTER — Inpatient Hospital Stay (HOSPITAL_COMMUNITY): Payer: Medicare PPO

## 2018-05-04 DIAGNOSIS — C189 Malignant neoplasm of colon, unspecified: Secondary | ICD-10-CM | POA: Diagnosis not present

## 2018-05-04 DIAGNOSIS — Z452 Encounter for adjustment and management of vascular access device: Secondary | ICD-10-CM | POA: Diagnosis not present

## 2018-05-04 DIAGNOSIS — Z5111 Encounter for antineoplastic chemotherapy: Secondary | ICD-10-CM | POA: Insufficient documentation

## 2018-05-04 DIAGNOSIS — C7800 Secondary malignant neoplasm of unspecified lung: Secondary | ICD-10-CM | POA: Insufficient documentation

## 2018-05-04 DIAGNOSIS — C787 Secondary malignant neoplasm of liver and intrahepatic bile duct: Secondary | ICD-10-CM | POA: Diagnosis not present

## 2018-05-04 LAB — CBC WITH DIFFERENTIAL/PLATELET
ABS IMMATURE GRANULOCYTES: 0.07 10*3/uL (ref 0.00–0.07)
Basophils Absolute: 0.1 10*3/uL (ref 0.0–0.1)
Basophils Relative: 1 %
Eosinophils Absolute: 0.1 10*3/uL (ref 0.0–0.5)
Eosinophils Relative: 2 %
HCT: 37.2 % — ABNORMAL LOW (ref 39.0–52.0)
Hemoglobin: 11.9 g/dL — ABNORMAL LOW (ref 13.0–17.0)
IMMATURE GRANULOCYTES: 1 %
Lymphocytes Relative: 15 %
Lymphs Abs: 1.1 10*3/uL (ref 0.7–4.0)
MCH: 27.6 pg (ref 26.0–34.0)
MCHC: 32 g/dL (ref 30.0–36.0)
MCV: 86.3 fL (ref 80.0–100.0)
MONOS PCT: 12 %
Monocytes Absolute: 0.9 10*3/uL (ref 0.1–1.0)
NEUTROS PCT: 69 %
Neutro Abs: 5.4 10*3/uL (ref 1.7–7.7)
Platelets: 227 10*3/uL (ref 150–400)
RBC: 4.31 MIL/uL (ref 4.22–5.81)
RDW: 15.1 % (ref 11.5–15.5)
WBC: 7.7 10*3/uL (ref 4.0–10.5)
nRBC: 0 % (ref 0.0–0.2)

## 2018-05-04 LAB — COMPREHENSIVE METABOLIC PANEL
ALT: 17 U/L (ref 0–44)
AST: 16 U/L (ref 15–41)
Albumin: 3.5 g/dL (ref 3.5–5.0)
Alkaline Phosphatase: 140 U/L — ABNORMAL HIGH (ref 38–126)
Anion gap: 12 (ref 5–15)
BUN: 19 mg/dL (ref 8–23)
CO2: 25 mmol/L (ref 22–32)
Calcium: 9 mg/dL (ref 8.9–10.3)
Chloride: 91 mmol/L — ABNORMAL LOW (ref 98–111)
Creatinine, Ser: 1.11 mg/dL (ref 0.61–1.24)
GFR calc Af Amer: >60 mL/min (ref >60)
GFR calc non Af Amer: >60 mL/min (ref >60)
Glucose, Bld: 482 mg/dL — ABNORMAL HIGH (ref 70–99)
Potassium: 4.3 mmol/L (ref 3.5–5.1)
Sodium: 128 mmol/L — ABNORMAL LOW (ref 135–145)
Total Bilirubin: 0.5 mg/dL (ref 0.3–1.2)
Total Protein: 7.7 g/dL (ref 6.5–8.1)

## 2018-05-04 LAB — MAGNESIUM: Magnesium: 1.8 mg/dL (ref 1.7–2.4)

## 2018-05-04 NOTE — Progress Notes (Signed)
Christopher Friedman reports fever, chills, body aches, non-productive cough, and clear nasal drainage since Christmas Day. Mask placed on pt.  Dr. Delton Coombes notified and instructed to defer pt x 1 week.  Pt is out of the time frame for Tamiflu and does not require abx based on s/s.  It is noted that pt's spouse called triage line yesterday morning stating pt was having "severe abdominal pain on right side, temp of 100.5 and chills," and was "talking out of his head".  Pt was advised by triage nurse to report to the ED.  Pt did no go to the ED.  Today Christopher Friedman is a&ox4, in no distress, with stable VS.  He denies any abdominal pain at present.  Instructed as per Dr. Delton Coombes to use OTC cold and flu medication for symptom management. We will defer his tx x 1 week per MD.

## 2018-05-05 LAB — CEA: CEA: 252 ng/mL — ABNORMAL HIGH (ref 0.0–4.7)

## 2018-05-06 ENCOUNTER — Encounter (HOSPITAL_COMMUNITY): Payer: Medicare PPO

## 2018-05-13 ENCOUNTER — Inpatient Hospital Stay (HOSPITAL_COMMUNITY): Payer: Medicare PPO

## 2018-05-13 ENCOUNTER — Other Ambulatory Visit: Payer: Self-pay

## 2018-05-13 ENCOUNTER — Encounter (HOSPITAL_COMMUNITY): Payer: Self-pay

## 2018-05-13 VITALS — BP 123/71 | HR 72 | Temp 97.8°F | Resp 18 | Ht 71.0 in | Wt 208.2 lb

## 2018-05-13 DIAGNOSIS — C7800 Secondary malignant neoplasm of unspecified lung: Secondary | ICD-10-CM | POA: Diagnosis not present

## 2018-05-13 DIAGNOSIS — Z452 Encounter for adjustment and management of vascular access device: Secondary | ICD-10-CM | POA: Diagnosis not present

## 2018-05-13 DIAGNOSIS — Z5111 Encounter for antineoplastic chemotherapy: Secondary | ICD-10-CM | POA: Diagnosis not present

## 2018-05-13 DIAGNOSIS — C787 Secondary malignant neoplasm of liver and intrahepatic bile duct: Principal | ICD-10-CM

## 2018-05-13 DIAGNOSIS — C189 Malignant neoplasm of colon, unspecified: Secondary | ICD-10-CM

## 2018-05-13 LAB — CBC WITH DIFFERENTIAL/PLATELET
Abs Immature Granulocytes: 0.14 10*3/uL — ABNORMAL HIGH (ref 0.00–0.07)
Basophils Absolute: 0.1 10*3/uL (ref 0.0–0.1)
Basophils Relative: 1 %
Eosinophils Absolute: 0.4 10*3/uL (ref 0.0–0.5)
Eosinophils Relative: 4 %
HCT: 36.9 % — ABNORMAL LOW (ref 39.0–52.0)
Hemoglobin: 11.2 g/dL — ABNORMAL LOW (ref 13.0–17.0)
Immature Granulocytes: 1 %
Lymphocytes Relative: 20 %
Lymphs Abs: 2 10*3/uL (ref 0.7–4.0)
MCH: 27.3 pg (ref 26.0–34.0)
MCHC: 30.4 g/dL (ref 30.0–36.0)
MCV: 89.8 fL (ref 80.0–100.0)
Monocytes Absolute: 0.8 10*3/uL (ref 0.1–1.0)
Monocytes Relative: 8 %
NRBC: 0 % (ref 0.0–0.2)
Neutro Abs: 6.7 10*3/uL (ref 1.7–7.7)
Neutrophils Relative %: 66 %
Platelets: 273 10*3/uL (ref 150–400)
RBC: 4.11 MIL/uL — ABNORMAL LOW (ref 4.22–5.81)
RDW: 15.1 % (ref 11.5–15.5)
WBC: 10.1 10*3/uL (ref 4.0–10.5)

## 2018-05-13 LAB — COMPREHENSIVE METABOLIC PANEL
ALBUMIN: 3.7 g/dL (ref 3.5–5.0)
ALT: 17 U/L (ref 0–44)
ANION GAP: 10 (ref 5–15)
AST: 19 U/L (ref 15–41)
Alkaline Phosphatase: 144 U/L — ABNORMAL HIGH (ref 38–126)
BUN: 17 mg/dL (ref 8–23)
CO2: 28 mmol/L (ref 22–32)
Calcium: 9.2 mg/dL (ref 8.9–10.3)
Chloride: 95 mmol/L — ABNORMAL LOW (ref 98–111)
Creatinine, Ser: 1.18 mg/dL (ref 0.61–1.24)
GFR calc Af Amer: >60 mL/min (ref >60)
GFR calc non Af Amer: >60 mL/min (ref >60)
Glucose, Bld: 273 mg/dL — ABNORMAL HIGH (ref 70–99)
Potassium: 3.5 mmol/L (ref 3.5–5.1)
Sodium: 133 mmol/L — ABNORMAL LOW (ref 135–145)
Total Bilirubin: 0.5 mg/dL (ref 0.3–1.2)
Total Protein: 8.2 g/dL — ABNORMAL HIGH (ref 6.5–8.1)

## 2018-05-13 LAB — MAGNESIUM: Magnesium: 1.7 mg/dL (ref 1.7–2.4)

## 2018-05-13 MED ORDER — SODIUM CHLORIDE 0.9% FLUSH
10.0000 mL | INTRAVENOUS | Status: DC | PRN
  Administered 2018-05-13: 10 mL
  Filled 2018-05-13: qty 10

## 2018-05-13 MED ORDER — SODIUM CHLORIDE 0.9 % IV SOLN
10.0000 mg | Freq: Once | INTRAVENOUS | Status: AC
  Administered 2018-05-13: 10 mg via INTRAVENOUS
  Filled 2018-05-13: qty 1

## 2018-05-13 MED ORDER — IRINOTECAN HCL CHEMO INJECTION 100 MG/5ML
144.0000 mg/m2 | Freq: Once | INTRAVENOUS | Status: AC
  Administered 2018-05-13: 320 mg via INTRAVENOUS
  Filled 2018-05-13: qty 15

## 2018-05-13 MED ORDER — ATROPINE SULFATE 1 MG/ML IJ SOLN
0.5000 mg | Freq: Once | INTRAMUSCULAR | Status: AC
  Administered 2018-05-13: 0.5 mg via INTRAVENOUS
  Filled 2018-05-13: qty 1

## 2018-05-13 MED ORDER — LEUCOVORIN CALCIUM INJECTION 350 MG
900.0000 mg | Freq: Once | INTRAVENOUS | Status: AC
  Administered 2018-05-13: 900 mg via INTRAVENOUS
  Filled 2018-05-13: qty 10

## 2018-05-13 MED ORDER — SODIUM CHLORIDE 0.9 % IV SOLN
1920.0000 mg/m2 | INTRAVENOUS | Status: DC
  Administered 2018-05-13: 4250 mg via INTRAVENOUS
  Filled 2018-05-13: qty 85

## 2018-05-13 MED ORDER — SODIUM CHLORIDE 0.9 % IV SOLN
Freq: Once | INTRAVENOUS | Status: AC
  Administered 2018-05-13: 10:00:00 via INTRAVENOUS

## 2018-05-13 MED ORDER — PALONOSETRON HCL INJECTION 0.25 MG/5ML
0.2500 mg | Freq: Once | INTRAVENOUS | Status: AC
  Administered 2018-05-13: 0.25 mg via INTRAVENOUS
  Filled 2018-05-13: qty 5

## 2018-05-13 MED ORDER — SODIUM CHLORIDE 0.9 % IV SOLN
INTRAVENOUS | Status: DC
  Administered 2018-05-13: 10:00:00 via INTRAVENOUS

## 2018-05-13 MED ORDER — FLUOROURACIL CHEMO INJECTION 2.5 GM/50ML
320.0000 mg/m2 | Freq: Once | INTRAVENOUS | Status: AC
  Administered 2018-05-13: 700 mg via INTRAVENOUS
  Filled 2018-05-13: qty 14

## 2018-05-13 NOTE — Progress Notes (Signed)
Patient tolerated treatment with no complaints voiced.  Port site clean and dry with no bruising or swelling noted at site.  Dressing intact.  Chemotherapy pump connected with no alarms noted.  VSs with discharge and left ambulatory with no s/s of distress noted.

## 2018-05-13 NOTE — Patient Instructions (Signed)
[facility] Discharge Instructions for Patients Receiving Chemotherapy  Today you received the following chemotherapy agents  If you develop nausea and vomiting that is not controlled by your nausea medication, call the clinic.   BELOW ARE SYMPTOMS THAT SHOULD BE REPORTED IMMEDIATELY:  *FEVER GREATER THAN 100.5 F  *CHILLS WITH OR WITHOUT FEVER  NAUSEA AND VOMITING THAT IS NOT CONTROLLED WITH YOUR NAUSEA MEDICATION  *UNUSUAL SHORTNESS OF BREATH  *UNUSUAL BRUISING OR BLEEDING  TENDERNESS IN MOUTH AND THROAT WITH OR WITHOUT PRESENCE OF ULCERS  *URINARY PROBLEMS  *BOWEL PROBLEMS  UNUSUAL RASH Items with * indicate a potential emergency and should be followed up as soon as possible.  Feel free to call the clinic should you have any questions or concerns. The clinic phone number is [phone].  Please show the CHEMO ALERT CARD at check-in to the Emergency Department and triage nurse.   

## 2018-05-15 ENCOUNTER — Inpatient Hospital Stay (HOSPITAL_COMMUNITY): Payer: Medicare PPO

## 2018-05-15 VITALS — BP 130/80 | HR 90 | Temp 98.6°F | Resp 18

## 2018-05-15 DIAGNOSIS — C787 Secondary malignant neoplasm of liver and intrahepatic bile duct: Secondary | ICD-10-CM | POA: Diagnosis not present

## 2018-05-15 DIAGNOSIS — Z5111 Encounter for antineoplastic chemotherapy: Secondary | ICD-10-CM | POA: Diagnosis not present

## 2018-05-15 DIAGNOSIS — C189 Malignant neoplasm of colon, unspecified: Secondary | ICD-10-CM

## 2018-05-15 DIAGNOSIS — C7800 Secondary malignant neoplasm of unspecified lung: Secondary | ICD-10-CM | POA: Diagnosis not present

## 2018-05-15 DIAGNOSIS — Z452 Encounter for adjustment and management of vascular access device: Secondary | ICD-10-CM | POA: Diagnosis not present

## 2018-05-15 MED ORDER — HEPARIN SOD (PORK) LOCK FLUSH 100 UNIT/ML IV SOLN
500.0000 [IU] | Freq: Once | INTRAVENOUS | Status: AC | PRN
  Administered 2018-05-15: 500 [IU]

## 2018-05-15 MED ORDER — SODIUM CHLORIDE 0.9% FLUSH
10.0000 mL | INTRAVENOUS | Status: DC | PRN
  Administered 2018-05-15: 10 mL
  Filled 2018-05-15: qty 10

## 2018-05-15 NOTE — Progress Notes (Signed)
Christopher Friedman returns today for port de access and flush after 46 hr continous infusion of 54fu. Tolerated infusion without problems. Portacath located Rightchest wall was  deaccessed and flushed with 47ml NS and 500U/45ml Heparin and needle removed intact.  Procedure without incident. Patient tolerated procedure well.

## 2018-05-15 NOTE — Patient Instructions (Signed)
[facility] at [facility]  Discharge Instructions:   _______________________________________________________________  Thank you for choosing [facility] at [facility] to provide your oncology and hematology care.  To afford each patient quality time with our providers, please arrive at least 15 minutes before your scheduled appointment.  You need to re-schedule your appointment if you arrive 10 or more minutes late.  We strive to give you quality time with our providers, and arriving late affects you and other patients whose appointments are after yours.  Also, if you no show three or more times for appointments you may be dismissed from the clinic.  Again, thank you for choosing [facility] at [facility]. Our hope is that these requests will allow you access to exceptional care and in a timely manner. _______________________________________________________________  If you have questions after your visit, please contact our office at [phone] between the hours of 8:30 a.m. and 5:00 p.m. Voicemails left after 4:30 p.m. will not be returned until the following business day. _______________________________________________________________  For prescription refill requests, have your pharmacy contact our office. _______________________________________________________________  Recommendations made by the consultant and any test results will be sent to your referring physician. _______________________________________________________________ 

## 2018-05-18 ENCOUNTER — Ambulatory Visit (HOSPITAL_COMMUNITY): Payer: Medicare PPO

## 2018-05-18 ENCOUNTER — Other Ambulatory Visit (HOSPITAL_COMMUNITY): Payer: Medicare PPO

## 2018-05-18 ENCOUNTER — Ambulatory Visit (HOSPITAL_COMMUNITY): Payer: Medicare PPO | Admitting: Hematology

## 2018-05-20 ENCOUNTER — Encounter (HOSPITAL_COMMUNITY): Payer: Medicare PPO

## 2018-05-27 ENCOUNTER — Other Ambulatory Visit: Payer: Self-pay

## 2018-05-27 ENCOUNTER — Inpatient Hospital Stay (HOSPITAL_BASED_OUTPATIENT_CLINIC_OR_DEPARTMENT_OTHER): Payer: Medicare PPO | Admitting: Hematology

## 2018-05-27 ENCOUNTER — Inpatient Hospital Stay (HOSPITAL_COMMUNITY): Payer: Medicare PPO

## 2018-05-27 ENCOUNTER — Encounter (HOSPITAL_COMMUNITY): Payer: Self-pay | Admitting: Hematology

## 2018-05-27 VITALS — BP 123/76 | HR 73 | Temp 98.2°F | Resp 18

## 2018-05-27 VITALS — BP 123/81 | HR 86 | Temp 98.5°F | Resp 16 | Wt 206.0 lb

## 2018-05-27 DIAGNOSIS — C7800 Secondary malignant neoplasm of unspecified lung: Secondary | ICD-10-CM

## 2018-05-27 DIAGNOSIS — C189 Malignant neoplasm of colon, unspecified: Secondary | ICD-10-CM

## 2018-05-27 DIAGNOSIS — Z452 Encounter for adjustment and management of vascular access device: Secondary | ICD-10-CM | POA: Diagnosis not present

## 2018-05-27 DIAGNOSIS — Z87891 Personal history of nicotine dependence: Secondary | ICD-10-CM | POA: Diagnosis not present

## 2018-05-27 DIAGNOSIS — Z7984 Long term (current) use of oral hypoglycemic drugs: Secondary | ICD-10-CM

## 2018-05-27 DIAGNOSIS — E1142 Type 2 diabetes mellitus with diabetic polyneuropathy: Secondary | ICD-10-CM

## 2018-05-27 DIAGNOSIS — C787 Secondary malignant neoplasm of liver and intrahepatic bile duct: Secondary | ICD-10-CM

## 2018-05-27 DIAGNOSIS — Z5111 Encounter for antineoplastic chemotherapy: Secondary | ICD-10-CM | POA: Diagnosis not present

## 2018-05-27 DIAGNOSIS — I10 Essential (primary) hypertension: Secondary | ICD-10-CM

## 2018-05-27 LAB — COMPREHENSIVE METABOLIC PANEL
ALT: 16 U/L (ref 0–44)
AST: 19 U/L (ref 15–41)
Albumin: 3.9 g/dL (ref 3.5–5.0)
Alkaline Phosphatase: 133 U/L — ABNORMAL HIGH (ref 38–126)
Anion gap: 10 (ref 5–15)
BUN: 15 mg/dL (ref 8–23)
CO2: 28 mmol/L (ref 22–32)
Calcium: 9.6 mg/dL (ref 8.9–10.3)
Chloride: 97 mmol/L — ABNORMAL LOW (ref 98–111)
Creatinine, Ser: 0.98 mg/dL (ref 0.61–1.24)
GFR calc Af Amer: >60 mL/min (ref >60)
GFR calc non Af Amer: >60 mL/min (ref >60)
Glucose, Bld: 367 mg/dL — ABNORMAL HIGH (ref 70–99)
Potassium: 4.4 mmol/L (ref 3.5–5.1)
SODIUM: 135 mmol/L (ref 135–145)
Total Bilirubin: 0.8 mg/dL (ref 0.3–1.2)
Total Protein: 7.8 g/dL (ref 6.5–8.1)

## 2018-05-27 LAB — CBC WITH DIFFERENTIAL/PLATELET
Abs Immature Granulocytes: 0.02 10*3/uL (ref 0.00–0.07)
Basophils Absolute: 0.1 10*3/uL (ref 0.0–0.1)
Basophils Relative: 1 %
Eosinophils Absolute: 0.3 10*3/uL (ref 0.0–0.5)
Eosinophils Relative: 4 %
HCT: 37 % — ABNORMAL LOW (ref 39.0–52.0)
Hemoglobin: 11.3 g/dL — ABNORMAL LOW (ref 13.0–17.0)
Immature Granulocytes: 0 %
Lymphocytes Relative: 21 %
Lymphs Abs: 1.3 10*3/uL (ref 0.7–4.0)
MCH: 27.2 pg (ref 26.0–34.0)
MCHC: 30.5 g/dL (ref 30.0–36.0)
MCV: 89.2 fL (ref 80.0–100.0)
MONO ABS: 0.5 10*3/uL (ref 0.1–1.0)
Monocytes Relative: 8 %
Neutro Abs: 4.1 10*3/uL (ref 1.7–7.7)
Neutrophils Relative %: 66 %
Platelets: 245 10*3/uL (ref 150–400)
RBC: 4.15 MIL/uL — ABNORMAL LOW (ref 4.22–5.81)
RDW: 16 % — ABNORMAL HIGH (ref 11.5–15.5)
WBC: 6.2 10*3/uL (ref 4.0–10.5)
nRBC: 0 % (ref 0.0–0.2)

## 2018-05-27 LAB — MAGNESIUM: Magnesium: 1.9 mg/dL (ref 1.7–2.4)

## 2018-05-27 MED ORDER — SODIUM CHLORIDE 0.9 % IV SOLN
900.0000 mg | Freq: Once | INTRAVENOUS | Status: AC
  Administered 2018-05-27: 900 mg via INTRAVENOUS
  Filled 2018-05-27: qty 10

## 2018-05-27 MED ORDER — PALONOSETRON HCL INJECTION 0.25 MG/5ML
0.2500 mg | Freq: Once | INTRAVENOUS | Status: AC
  Administered 2018-05-27: 0.25 mg via INTRAVENOUS

## 2018-05-27 MED ORDER — ATROPINE SULFATE 1 MG/ML IJ SOLN
INTRAMUSCULAR | Status: AC
  Filled 2018-05-27: qty 1

## 2018-05-27 MED ORDER — FLUOROURACIL CHEMO INJECTION 2.5 GM/50ML
320.0000 mg/m2 | Freq: Once | INTRAVENOUS | Status: AC
  Administered 2018-05-27: 700 mg via INTRAVENOUS
  Filled 2018-05-27: qty 14

## 2018-05-27 MED ORDER — PALONOSETRON HCL INJECTION 0.25 MG/5ML
INTRAVENOUS | Status: AC
  Filled 2018-05-27: qty 5

## 2018-05-27 MED ORDER — SODIUM CHLORIDE 0.9% FLUSH
10.0000 mL | INTRAVENOUS | Status: DC | PRN
  Administered 2018-05-27: 10 mL
  Filled 2018-05-27: qty 10

## 2018-05-27 MED ORDER — SODIUM CHLORIDE 0.9 % IV SOLN
Freq: Once | INTRAVENOUS | Status: AC
  Administered 2018-05-27: 11:00:00 via INTRAVENOUS

## 2018-05-27 MED ORDER — ATROPINE SULFATE 1 MG/ML IJ SOLN
0.5000 mg | Freq: Once | INTRAMUSCULAR | Status: AC
  Administered 2018-05-27: 0.5 mg via INTRAVENOUS

## 2018-05-27 MED ORDER — SODIUM CHLORIDE 0.9 % IV SOLN
144.0000 mg/m2 | Freq: Once | INTRAVENOUS | Status: AC
  Administered 2018-05-27: 320 mg via INTRAVENOUS
  Filled 2018-05-27: qty 2

## 2018-05-27 MED ORDER — GLIPIZIDE ER 5 MG PO TB24: 5.0000 mg | ORAL_TABLET | Freq: Every day | ORAL | 2 refills | Status: DC

## 2018-05-27 MED ORDER — SODIUM CHLORIDE 0.9 % IV SOLN
INTRAVENOUS | Status: DC
  Administered 2018-05-27: 11:00:00 via INTRAVENOUS

## 2018-05-27 MED ORDER — SODIUM CHLORIDE 0.9 % IV SOLN
1920.0000 mg/m2 | INTRAVENOUS | Status: DC
  Administered 2018-05-27: 4250 mg via INTRAVENOUS
  Filled 2018-05-27: qty 85

## 2018-05-27 MED ORDER — SODIUM CHLORIDE 0.9 % IV SOLN
10.0000 mg | Freq: Once | INTRAVENOUS | Status: AC
  Administered 2018-05-27: 10 mg via INTRAVENOUS
  Filled 2018-05-27: qty 10

## 2018-05-27 NOTE — Progress Notes (Signed)
Patient is ready for treatment labs reviewed .

## 2018-05-27 NOTE — Progress Notes (Signed)
Patient seen by the oncologist with lab review and ok to treat today verbal order Dr. Delton Coombes.    Patient tolerated treatment with no complaints voiced.  Port site clean and dry with no bruising or swelling noted at site.  Chemotherapy pump connected with no alarms noted.  Dressing intact.  VSS with discharge and left ambulatory with no s/s of distress noted.

## 2018-05-27 NOTE — Assessment & Plan Note (Signed)
1.  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  He tolerated it very well.  CT CAP showed stable left upper lobe subcentimeter lung nodule, segment 4 lesion in the liver has decreased to 2.4 cm.  -He was evaluated by Dr. Anselm Pancoast, who thought microwave thermal ablation would be the best procedure for him.  He would like to save  Y 90 for later recurrences.  An MRI of the liver dated 11/21/2017 showed left hepatic lobe lesion measuring 2.6 x 2.1 cm.  No other lesions were seen. -He underwent left hepatic lobe microwave ablation on 12/10/2017.  He did very well with minimal pain and complications.  He is able to do all his day-to-day activities without any problems. - Last CEA of went up to 19.9.  He is completely asymptomatic. -CT scan of the abdomen pelvis on 02/05/2018 showed left hepatic lesion with central high attenuation and a large area of low-attenuation.  There is a new left hepatic lobe lesion measuring 1.3 cm.  CT chest showed similar appearing small nodules. - MRI of the liver dated 02/25/2018 shows heterogeneous mass which surrounds the right side of the ablation defect and extends inferiorly in segment 4B measuring 6.9 x 5.6 cm, segment 2 lesion measuring 2.6 cm, segment 8 subcapsular 1.6 cm lesion.  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 - He has at least 3 new lesions and questionable capsular metastasis.  I do not believe he is a candidate for local therapy.  I have recommended going back on chemotherapy.  His last chemo was in July 2019, dose reduced FOLFIRI. - FOLFIRI was started back on 03/02/2018. - Cycle 3 chemotherapy was on 03/30/2018.  He did have a history of rectal bleed with Avastin.  Hence it was not added. - He developed fever after cycle 3 and work-up came back negative. -He tolerated subsequent cycles fairly well.  Cycle 5 was on  05/13/2018. -CEA level on 05/04/2018 has improved to 252.  This was previously 271 on 04/13/2018. - We have reviewed his blood work today.  He may proceed with cycle 6 today.  I plan to repeat CT scan of his CAP prior to next visit.  We will also follow-up on CEA level from today.  2.  Diabetes: -he is taking metformin 500 mg daily.  If he increases it, he gets diarrhea. - He is also on Glucotrol XL 10 mg daily with breakfast.  His sugars have been in the range of 300-400.  I will increase it to 15 mg daily.  We have sent a prescription for 5 mg tablet to be taken along with the 10 mg tablet.  3.  Peripheral neuropathy: -This is likely from diabetes.  He has mild numbness in the feet predominantly at bedtime.  He never received oxaliplatin based chemotherapy.  4.  Hypertension: This is well controlled on valsartan HCTZ 160-25 mg daily.

## 2018-05-27 NOTE — Patient Instructions (Signed)
[facility] at [facility] Discharge Instructions     Thank you for choosing [facility] at [facility] to provide your oncology and hematology care.  To afford each patient quality time with our provider, please arrive at least 15 minutes before your scheduled appointment time.   If you have a lab appointment with the Cancer Center please come in thru the  Main Entrance and check in at the main information desk  You need to re-schedule your appointment should you arrive 10 or more minutes late.  We strive to give you quality time with our providers, and arriving late affects you and other patients whose appointments are after yours.  Also, if you no show three or more times for appointments you may be dismissed from the clinic at the providers discretion.     Again, thank you for choosing [facility].  Our hope is that these requests will decrease the amount of time that you wait before being seen by our physicians.       _____________________________________________________________  Should you have questions after your visit to [facility], please contact our office at [phone] between the hours of 8:00 a.m. and 4:30 p.m.  Voicemails left after 4:00 p.m. will not be returned until the following business day.  For prescription refill requests, have your pharmacy contact our office and allow 72 hours.    Cancer Center Support Programs:   > Cancer Support Group  2nd Tuesday of the month 1pm-2pm, Journey Room    

## 2018-05-27 NOTE — Patient Instructions (Signed)
[facility] Discharge Instructions for Patients Receiving Chemotherapy  Today you received the following chemotherapy agents  If you develop nausea and vomiting that is not controlled by your nausea medication, call the clinic.   BELOW ARE SYMPTOMS THAT SHOULD BE REPORTED IMMEDIATELY:  *FEVER GREATER THAN 100.5 F  *CHILLS WITH OR WITHOUT FEVER  NAUSEA AND VOMITING THAT IS NOT CONTROLLED WITH YOUR NAUSEA MEDICATION  *UNUSUAL SHORTNESS OF BREATH  *UNUSUAL BRUISING OR BLEEDING  TENDERNESS IN MOUTH AND THROAT WITH OR WITHOUT PRESENCE OF ULCERS  *URINARY PROBLEMS  *BOWEL PROBLEMS  UNUSUAL RASH Items with * indicate a potential emergency and should be followed up as soon as possible.  Feel free to call the clinic should you have any questions or concerns. The clinic phone number is [phone].  Please show the CHEMO ALERT CARD at check-in to the Emergency Department and triage nurse.   

## 2018-05-27 NOTE — Progress Notes (Signed)
[name], [place] [address]   CLINIC:  Medical Oncology/Hematology  PCP:  Christopher Bis, MD [place] [address] [phone]   REASON FOR VISIT: Follow-up for metastatic colon cancer to the liver  CURRENT THERAPY: Folfiri every 2 weeks  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 ([place])   05/28/2017 -  Chemotherapy    The patient had palonosetron (ALOXI) injection 0.25 mg, 0.25 mg, Intravenous,  Once, 18 of 22 cycles Administration: 0.25 mg (09/03/2017), 0.25 mg (10/01/2017), 0.25 mg (10/15/2017), 0.25 mg (10/29/2017), 0.25 mg (09/17/2017), 0.25 mg (03/02/2018), 0.25 mg (03/16/2018), 0.25 mg (03/30/2018), 0.25 mg (04/13/2018), 0.25 mg (05/13/2018) irinotecan (CAMPTOSAR) 400 mg in dextrose 5 % 500 mL chemo infusion, 180 mg/m2, Intravenous,  Once, 18 of 22 cycles Dose modification: 144 mg/m2 (Cycle 4, Reason: Provider Judgment) Administration: 320 mg (09/03/2017), 320 mg (10/01/2017), 320 mg (10/15/2017), 320 mg (10/29/2017), 320 mg (09/17/2017), 320 mg (03/02/2018), 320 mg (03/16/2018), 320 mg (03/30/2018), 320 mg (04/13/2018), 320 mg (05/13/2018) leucovorin 888 mg in dextrose 5 % 250 mL infusion, 400 mg/m2, Intravenous,  Once, 18 of 22 cycles Administration: 900 mg (09/03/2017), 900 mg (10/01/2017), 900 mg (10/15/2017), 900 mg (10/29/2017), 900 mg (09/17/2017), 900 mg (03/02/2018), 900 mg (03/16/2018), 900 mg (03/30/2018), 900 mg (04/13/2018), 900 mg (05/13/2018) fluorouracil (ADRUCIL) chemo injection 900 mg, 400 mg/m2, Intravenous,  Once, 18 of 22 cycles Dose modification: 320 mg/m2 (Cycle 4, Reason: Provider Judgment) Administration: 700 mg (09/03/2017), 700 mg (10/01/2017), 700 mg (10/15/2017), 700 mg (10/29/2017), 700 mg (09/17/2017), 700 mg (03/02/2018), 700 mg (03/16/2018), 700 mg (03/30/2018), 700 mg (04/13/2018), 700 mg (05/13/2018) fluorouracil (ADRUCIL) 5,350 mg in sodium chloride 0.9 % 143 mL chemo infusion, 2,400 mg/m2, Intravenous, 1  Day/Dose, 18 of 22 cycles Dose modification: 1,920 mg/m2 (Cycle 4, Reason: Provider Judgment) Administration: 4,250 mg (09/03/2017), 4,250 mg (10/01/2017), 4,250 mg (10/15/2017), 4,250 mg (10/29/2017), 4,250 mg (09/17/2017), 4,250 mg (03/02/2018), 4,250 mg (03/16/2018), 4,250 mg (03/30/2018), 4,250 mg (04/13/2018), 4,250 mg (05/13/2018)  for chemotherapy treatment.     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 [facility])      CANCER STAGING: Cancer Staging Adenocarcinoma of colon metastatic to liver [facility]) Staging form: Colon and Rectum, AJCC 8th Edition - Clinical stage from 07/13/2017: Stage IVB (pM1b) - Signed by Christopher Jack, MD on 07/13/2017    INTERVAL HISTORY:  Christopher Friedman 67 y.o. male returns for routine follow-up for metastatic colon cancer to the liver. He is doing well with his treatments. He did have numbness of his feet after treatment ut has improved since. He did experience constipation after his treatments. Denies any nausea, vomiting, or diarrhea. Denies any new pains. Had not noticed any recent bleeding such as epistaxis, hematuria or hematochezia. Denies recent chest pain on exertion, shortness of breath on minimal exertion, pre-syncopal episodes, or palpitations. Denies any numbness or tingling in hands or feet. Denies any recent fevers, infections, or recent hospitalizations. Patient reports appetite at 100% and energy level at 50%.   REVIEW OF SYSTEMS:  Review of Systems  HENT:   Positive for mouth sores.   Gastrointestinal: Positive for constipation.  Neurological: Positive for numbness.  All other systems reviewed and are negative.    PAST MEDICAL/SURGICAL HISTORY:  Past Medical History:  Diagnosis Date  . Colon cancer ([place])   . Diabetes ([place])   . GERD (gastroesophageal reflux disease)   . Hypertension  Past Surgical History:  Procedure Laterality Date  . APPENDECTOMY     during colon surgery  . COLON SURGERY    . IR  RADIOLOGIST EVAL & MGMT  11/26/2017  . RADIOFREQUENCY ABLATION N/A 12/10/2017   Procedure: CT MICROWAVE THERMAL ABLATION (LIVER);  Surgeon: Markus Daft, MD;  Location: [facility] ORS;  Service: Anesthesiology;  Laterality: N/A;  . SHOULDER SURGERY  2003     SOCIAL HISTORY:  Social History   Socioeconomic History  . Marital status: Married    Spouse name: Not on file  . Number of children: Not on file  . Years of education: Not on file  . Highest education level: Not on file  Occupational History  . Not on file  Social Needs  . Financial resource strain: Not on file  . Food insecurity:    Worry: Not on file    Inability: Not on file  . Transportation needs:    Medical: Not on file    Non-medical: Not on file  Tobacco Use  . Smoking status: Former Smoker    Last attempt to quit: 04/05/1995    Years since quitting: 23.1  . Smokeless tobacco: Never Used  Substance and Sexual Activity  . Alcohol use: Not Currently    Alcohol/week: 0.0 standard drinks    Comment: used to drink but stopped about 20 years ago  . Drug use: Never  . Sexual activity: Not on file  Lifestyle  . Physical activity:    Days per week: Not on file    Minutes per session: Not on file  . Stress: Not on file  Relationships  . Social connections:    Talks on phone: Not on file    Gets together: Not on file    Attends religious service: Not on file    Active member of club or organization: Not on file    Attends meetings of clubs or organizations: Not on file    Relationship status: Not on file  . Intimate partner violence:    Fear of current or ex partner: Not on file    Emotionally abused: Not on file    Physically abused: Not on file    Forced sexual activity: Not on file  Other Topics Concern  . Not on file  Social History Narrative  . Not on file    FAMILY HISTORY:  Family History  Problem Relation Age of Onset  . Diabetes Mother   . Cancer Father   . Bone cancer Father   . Colon cancer Neg Hx     . Colon polyps Neg Hx     CURRENT MEDICATIONS:  Outpatient Encounter Medications as of 05/27/2018  Medication Sig  . Alpha-Lipoic Acid 200 MG CAPS Take 1 capsule by mouth daily.  . Ascorbic Acid (VITAMIN C) 1000 MG tablet Take 1,000 mg by mouth daily.  . ASHWAGANDHA PO Take 800 mg by mouth daily.  Marland Kitchen aspirin 81 MG tablet Take 81 mg by mouth daily.  Marland Kitchen atorvastatin (LIPITOR) 10 MG tablet Take 10 mg by mouth daily.   Marland Kitchen BEE POLLEN PO Take 1 capsule by mouth daily.   . Blood Glucose Monitoring Suppl (ONE TOUCH ULTRA MINI) W/DEVICE KIT USE TO CHECK BLOOD SUGAR DAILY.  . cholecalciferol (VITAMIN D) 1000 units tablet Take 1,000 Units by mouth daily.  Marland Kitchen CINNAMON PO Take 1,000 mg by mouth daily.  Marland Kitchen Cod Liver Oil CAPS Take 1 capsule by mouth daily.  . Coenzyme Q10 (CO Q 10) 100 MG  CAPS Take 1 capsule by mouth daily.   . Cyanocobalamin (VITAMIN B-12) 5000 MCG TBDP Take 5,000 mcg by mouth daily.  . Flax Oil-Fish Oil-Borage Oil (FISH OIL-FLAX OIL-BORAGE OIL) CAPS Take 1 capsule by mouth daily.  . fluorouracil CALGB 54492 in sodium chloride 0.9 % 150 mL Inject into the vein over 96 hr.  . Garlic 0100 MG CAPS Take 1 capsule by mouth daily.  Marland Kitchen glipiZIDE (GLUCOTROL XL) 10 MG 24 hr tablet Take 10 mg by mouth daily.   . Glucosamine-Chondroit-Collagen ([employer] GLUCO-CHONDROIT PLUS UC-II PO) Take 1 tablet by mouth daily.   . IRINOTECAN HCL IV Inject into the vein.  Marland Kitchen LECITHIN PO Take 1 capsule by mouth daily.  Marland Kitchen LEUCOVORIN CALCIUM IV Inject into the vein.  Marland Kitchen lidocaine-prilocaine (EMLA) cream Apply small amount over port site and cover with plastic wrap one hour prior to appointment.  . magnesium oxide (MAG-OX) 400 MG tablet Take 400 mg by mouth daily.   . metFORMIN (GLUCOPHAGE) 500 MG tablet Take 1 tablet (500 mg total) by mouth 2 (two) times daily with a meal. (Patient taking differently: Take 500 mg by mouth daily with breakfast. )  . milk thistle 175 MG tablet Take 175 mg by mouth daily.  Marland Kitchen OVER THE COUNTER  MEDICATION Take 1 capsule by mouth daily. Mushroom Complex 750 mg  . OVER THE COUNTER MEDICATION Take 1 capsule by mouth daily. Cura Med 750 mg  . oxyCODONE-acetaminophen (PERCOCET/ROXICET) 5-325 MG tablet Take 1 tablet by mouth every 6 (six) hours as needed. for pain  . valsartan-hydrochlorothiazide (DIOVAN-HCT) 160-25 MG tablet Take 1 tablet by mouth daily.  . diphenoxylate-atropine (LOMOTIL) 2.5-0.025 MG tablet Take 1 tablet by mouth every 6 (six) hours as needed for diarrhea or loose stools (stomach cramps). (Patient not taking: Reported on 05/27/2018)  . glipiZIDE (GLIPIZIDE XL) 5 MG 24 hr tablet Take 1 tablet (5 mg total) by mouth daily with breakfast.  . prochlorperazine (COMPAZINE) 10 MG tablet Take 1 tablet (10 mg total) by mouth every 6 (six) hours as needed for nausea or vomiting. (Patient not taking: Reported on 05/27/2018)   Facility-Administered Encounter Medications as of 05/27/2018  Medication  . sodium chloride flush (NS) 0.9 % injection 10 mL    ALLERGIES:  No Known Allergies   PHYSICAL EXAM:  ECOG Performance status: 1  Vitals:   05/27/18 1000  BP: 123/81  Pulse: 86  Resp: 16  Temp: 98.5 F (36.9 C)  SpO2: 97%   Filed Weights   05/27/18 1000  Weight: 206 lb (93.4 kg)    Physical Exam Constitutional:      Appearance: Normal appearance. He is normal weight.  Musculoskeletal: Normal range of motion.  Skin:    General: Skin is warm and dry.  Neurological:     Mental Status: He is alert and oriented to person, place, and time. Mental status is at baseline.  Psychiatric:        Mood and Affect: Mood normal.        Behavior: Behavior normal.        Thought Content: Thought content normal.        Judgment: Judgment normal.   Chest is bilateral clear to auscultation.  Cardiovascular sinus regular rate and rhythm. Abdomen: No palpable hepatomegaly or tenderness.   LABORATORY DATA:  I have reviewed the labs as listed.  CBC    Component Value Date/Time    WBC 6.2 05/27/2018 0954   RBC 4.15 (L) 05/27/2018 0954   HGB  11.3 (L) 05/27/2018 0954   HCT 37.0 (L) 05/27/2018 0954   PLT 245 05/27/2018 0954   MCV 89.2 05/27/2018 0954   MCH 27.2 05/27/2018 0954   MCHC 30.5 05/27/2018 0954   RDW 16.0 (H) 05/27/2018 0954   LYMPHSABS 1.3 05/27/2018 0954   MONOABS 0.5 05/27/2018 0954   EOSABS 0.3 05/27/2018 0954   BASOSABS 0.1 05/27/2018 0954   CMP Latest Ref Rng & Units 05/27/2018 05/13/2018 05/04/2018  Glucose 70 - 99 mg/dL 367(H) 273(H) 482(H)  BUN 8 - 23 mg/dL _0 Creatinine 0.61 - 1.24 mg/dL 0.98 1.18 1.11  Sodium 135 - 145 mmol/L 135 133(L) 128(L)  Potassium 3.5 - 5.1 mmol/L 4.4 3.5 4.3  Chloride 98 - 111 mmol/L 97(L) 95(L) 91(L)  CO2 22 - 32 mmol/L _1 Calcium 8.9 - 10.3 mg/dL 9.6 9.2 9.0  Total Protein 6.5 - 8.1 g/dL 7.8 8.2(H) 7.7  Total Bilirubin 0.3 - 1.2 mg/dL 0.8 0.5 0.5  Alkaline Phos 38 - 126 U/L 133(H) 144(H) 140(H)  AST 15 - 41 U/L _2 ALT 0 - 44 U/L _3 DIAGNOSTIC IMAGING:  I have independently reviewed the scans and discussed with the patient.   I have reviewed Francene Finders, NP's note and agree with the documentation.  I personally performed a face-to-face visit, made revisions and my assessment and plan is as follows.    ASSESSMENT & PLAN:   Adenocarcinoma of colon metastatic to liver ([place]) 1.  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  He tolerated it very well.  CT CAP showed stable left upper lobe subcentimeter lung nodule, segment 4 lesion in the liver has decreased to 2.4 cm.  -He was evaluated by Dr. Anselm Pancoast, who thought microwave thermal ablation would be the best procedure for him.  He would like to save  Y 90 for later recurrences.  An MRI of the liver dated 11/21/2017 showed left hepatic lobe lesion measuring 2.6 x 2.1 cm.  No other lesions were seen. -He underwent left hepatic lobe  microwave ablation on 12/10/2017.  He did very well with minimal pain and complications.  He is able to do all his day-to-day activities without any problems. - Last CEA of went up to 19.9.  He is completely asymptomatic. -CT scan of the abdomen pelvis on 02/05/2018 showed left hepatic lesion with central high attenuation and a large area of low-attenuation.  There is a new left hepatic lobe lesion measuring 1.3 cm.  CT chest showed similar appearing small nodules. - MRI of the liver dated 02/25/2018 shows heterogeneous mass which surrounds the right side of the ablation defect and extends inferiorly in segment 4B measuring 6.9 x 5.6 cm, segment 2 lesion measuring 2.6 cm, segment 8 subcapsular 1.6 cm lesion.  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 - He has at least 3 new lesions and questionable capsular metastasis.  I do not believe he is a candidate for local therapy.  I have recommended going back on chemotherapy.  His last chemo was in July 2019, dose reduced FOLFIRI. - FOLFIRI was started back on 03/02/2018. - Cycle 3 chemotherapy was on 03/30/2018.  He did have a history of rectal bleed with Avastin.  Hence it was not added. - He developed fever after cycle 3 and work-up came back  negative. -He tolerated subsequent cycles fairly well.  Cycle 5 was on 05/13/2018. -CEA level on 05/04/2018 has improved to 252.  This was previously 271 on 04/13/2018. - We have reviewed his blood work today.  He may proceed with cycle 6 today.  I plan to repeat CT scan of his CAP prior to next visit.  We will also follow-up on CEA level from today.  2.  Diabetes: -he is taking metformin 500 mg daily.  If he increases it, he gets diarrhea. - He is also on Glucotrol XL 10 mg daily with breakfast.  His sugars have been in the range of 300-400.  I will increase it to 15 mg daily.  We have sent a prescription for 5 mg tablet to be taken along with the 10 mg tablet.  3.   Peripheral neuropathy: -This is likely from diabetes.  He has mild numbness in the feet predominantly at bedtime.  He never received oxaliplatin based chemotherapy.  4.  Hypertension: This is well controlled on valsartan HCTZ 160-25 mg daily.      Orders placed this encounter:  No orders of the defined types were placed in this encounter.     Christopher Jack, MD [place] [phone]

## 2018-05-29 ENCOUNTER — Other Ambulatory Visit: Payer: Self-pay

## 2018-05-29 ENCOUNTER — Encounter (HOSPITAL_COMMUNITY): Payer: Self-pay

## 2018-05-29 ENCOUNTER — Encounter (HOSPITAL_COMMUNITY): Payer: Medicare PPO

## 2018-05-29 ENCOUNTER — Inpatient Hospital Stay (HOSPITAL_COMMUNITY): Payer: Medicare PPO

## 2018-05-29 VITALS — BP 127/82 | HR 96 | Temp 97.6°F | Resp 18

## 2018-05-29 DIAGNOSIS — C189 Malignant neoplasm of colon, unspecified: Secondary | ICD-10-CM

## 2018-05-29 DIAGNOSIS — Z5111 Encounter for antineoplastic chemotherapy: Secondary | ICD-10-CM | POA: Diagnosis not present

## 2018-05-29 DIAGNOSIS — C787 Secondary malignant neoplasm of liver and intrahepatic bile duct: Secondary | ICD-10-CM | POA: Diagnosis not present

## 2018-05-29 DIAGNOSIS — Z452 Encounter for adjustment and management of vascular access device: Secondary | ICD-10-CM | POA: Diagnosis not present

## 2018-05-29 DIAGNOSIS — C7800 Secondary malignant neoplasm of unspecified lung: Secondary | ICD-10-CM | POA: Diagnosis not present

## 2018-05-29 MED ORDER — SODIUM CHLORIDE 0.9% FLUSH
10.0000 mL | INTRAVENOUS | Status: DC | PRN
  Administered 2018-05-29: 10 mL
  Filled 2018-05-29: qty 10

## 2018-05-29 MED ORDER — HEPARIN SOD (PORK) LOCK FLUSH 100 UNIT/ML IV SOLN
500.0000 [IU] | Freq: Once | INTRAVENOUS | Status: AC | PRN
  Administered 2018-05-29: 500 [IU]

## 2018-05-29 NOTE — Progress Notes (Signed)
Anh Mangano presents to have home infusion pump d/c'd and for port-a-cath deaccess with flush.  Proper placement of portacath confirmed by CXR.  Portacath located right chest wall accessed with  H 20 needle.  Good blood return present. Portacath flushed with NS and 500U/29ml Heparin, and needle removed intact.  Procedure tolerated well and without incident.  Discharged ambulatory.

## 2018-06-10 ENCOUNTER — Other Ambulatory Visit (HOSPITAL_COMMUNITY): Payer: Self-pay | Admitting: Nurse Practitioner

## 2018-06-10 ENCOUNTER — Other Ambulatory Visit (HOSPITAL_COMMUNITY): Payer: Medicare PPO

## 2018-06-10 ENCOUNTER — Ambulatory Visit (HOSPITAL_COMMUNITY): Payer: Medicare PPO

## 2018-06-10 ENCOUNTER — Ambulatory Visit (HOSPITAL_COMMUNITY): Payer: Medicare PPO | Admitting: Hematology

## 2018-06-10 DIAGNOSIS — C189 Malignant neoplasm of colon, unspecified: Secondary | ICD-10-CM

## 2018-06-10 DIAGNOSIS — C787 Secondary malignant neoplasm of liver and intrahepatic bile duct: Principal | ICD-10-CM

## 2018-06-10 NOTE — Progress Notes (Signed)
Ct cher

## 2018-06-10 NOTE — Progress Notes (Deleted)
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 ([place])   05/28/2017 -  Chemotherapy    The patient had palonosetron (ALOXI) injection 0.25 mg, 0.25 mg, Intravenous,  Once, 18 of 22 cycles Administration: 0.25 mg (09/03/2017), 0.25 mg (10/01/2017), 0.25 mg (10/15/2017), 0.25 mg (10/29/2017), 0.25 mg (09/17/2017), 0.25 mg (03/02/2018), 0.25 mg (03/16/2018), 0.25 mg (03/30/2018), 0.25 mg (04/13/2018), 0.25 mg (05/13/2018), 0.25 mg (05/27/2018) irinotecan (CAMPTOSAR) 400 mg in dextrose 5 % 500 mL chemo infusion, 180 mg/m2, Intravenous,  Once, 18 of 22 cycles Dose modification: 144 mg/m2 (Cycle 4, Reason: Provider Judgment) Administration: 320 mg (09/03/2017), 320 mg (10/01/2017), 320 mg (10/15/2017), 320 mg (10/29/2017), 320 mg (09/17/2017), 320 mg (03/02/2018), 320 mg (03/16/2018), 320 mg (03/30/2018), 320 mg (04/13/2018), 320 mg (05/13/2018), 320 mg (05/27/2018) leucovorin 888 mg in dextrose 5 % 250 mL infusion, 400 mg/m2, Intravenous,  Once, 18 of 22 cycles Administration: 900 mg (09/03/2017), 900 mg (10/01/2017), 900 mg (10/15/2017), 900 mg (10/29/2017), 900 mg (09/17/2017), 900 mg (03/02/2018), 900 mg (03/16/2018), 900 mg (03/30/2018), 900 mg (04/13/2018), 900 mg (05/13/2018), 900 mg (05/27/2018) fluorouracil (ADRUCIL) chemo injection 900 mg, 400 mg/m2, Intravenous,  Once, 18 of 22 cycles Dose modification: 320 mg/m2 (Cycle 4, Reason: Provider Judgment) Administration: 700 mg (09/03/2017), 700 mg (10/01/2017), 700 mg (10/15/2017), 700 mg (10/29/2017), 700 mg (09/17/2017), 700 mg (03/02/2018), 700 mg (03/16/2018), 700 mg (03/30/2018), 700 mg (04/13/2018), 700 mg (05/13/2018), 700 mg (05/27/2018) fluorouracil (ADRUCIL) 5,350 mg  in sodium chloride 0.9 % 143 mL chemo infusion, 2,400 mg/m2, Intravenous, 1 Day/Dose, 18 of 22 cycles Dose modification: 1,920 mg/m2 (Cycle 4, Reason: Provider Judgment) Administration: 4,250 mg (09/03/2017), 4,250 mg (10/01/2017), 4,250 mg (10/15/2017), 4,250 mg (10/29/2017), 4,250 mg (09/17/2017), 4,250 mg (03/02/2018), 4,250 mg (03/16/2018), 4,250 mg (03/30/2018), 4,250 mg (04/13/2018), 4,250 mg (05/13/2018), 4,250 mg (05/27/2018)  for chemotherapy treatment.     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 Peplinski 68 y.o. male returns for routine follow-up for metastatic colon cancer to the liver.    REVIEW OF SYSTEMS:  Review of Systems - Oncology   PAST MEDICAL/SURGICAL HISTORY:  Past Medical History:  Diagnosis Date  . Colon cancer ([place])   . Diabetes ([place])   . GERD (gastroesophageal reflux disease)   . Hypertension    Past Surgical History:  Procedure Laterality Date  . APPENDECTOMY     during colon surgery  . COLON SURGERY    . IR RADIOLOGIST EVAL & MGMT  11/26/2017  . RADIOFREQUENCY ABLATION N/A 12/10/2017   Procedure: CT MICROWAVE THERMAL ABLATION (LIVER);  Surgeon: Markus Daft, MD;  Location: [facility] ORS;  Service: Anesthesiology;  Laterality: N/A;  . SHOULDER SURGERY  2003     SOCIAL HISTORY:  Social History   Socioeconomic History  . Marital status: Married    Spouse name: Not on file  . Number of children: Not on file  .  Years of education: Not on file  . Highest education level: Not on file  Occupational History  . Not on file  Social Needs  . Financial resource strain: Not on file  . Food insecurity:    Worry: Not on file    Inability: Not on file  . Transportation  needs:    Medical: Not on file    Non-medical: Not on file  Tobacco Use  . Smoking status: Former Smoker    Last attempt to quit: 04/05/1995    Years since quitting: 23.1  . Smokeless tobacco: Never Used  Substance and Sexual Activity  . Alcohol use: Not Currently    Alcohol/week: 0.0 standard drinks    Comment: used to drink but stopped about 20 years ago  . Drug use: Never  . Sexual activity: Not on file  Lifestyle  . Physical activity:    Days per week: Not on file    Minutes per session: Not on file  . Stress: Not on file  Relationships  . Social connections:    Talks on phone: Not on file    Gets together: Not on file    Attends religious service: Not on file    Active member of club or organization: Not on file    Attends meetings of clubs or organizations: Not on file    Relationship status: Not on file  . Intimate partner violence:    Fear of current or ex partner: Not on file    Emotionally abused: Not on file    Physically abused: Not on file    Forced sexual activity: Not on file  Other Topics Concern  . Not on file  Social History Narrative  . Not on file    FAMILY HISTORY:  Family History  Problem Relation Age of Onset  . Diabetes Mother   . Cancer Father   . Bone cancer Father   . Colon cancer Neg Hx   . Colon polyps Neg Hx     CURRENT MEDICATIONS:  Outpatient Encounter Medications as of 06/10/2018  Medication Sig  . Alpha-Lipoic Acid 200 MG CAPS Take 1 capsule by mouth daily.  . Ascorbic Acid (VITAMIN C) 1000 MG tablet Take 1,000 mg by mouth daily.  . ASHWAGANDHA PO Take 800 mg by mouth daily.  Marland Kitchen aspirin 81 MG tablet Take 81 mg by mouth daily.  Marland Kitchen atorvastatin (LIPITOR) 10 MG tablet Take 10 mg by mouth daily.   Marland Kitchen BEE POLLEN PO Take 1 capsule by mouth daily.   . Blood Glucose Monitoring Suppl (ONE TOUCH ULTRA MINI) W/DEVICE KIT USE TO CHECK BLOOD SUGAR DAILY.  . cholecalciferol (VITAMIN D) 1000 units tablet Take 1,000 Units by mouth daily.  Marland Kitchen  CINNAMON PO Take 1,000 mg by mouth daily.  Marland Kitchen Cod Liver Oil CAPS Take 1 capsule by mouth daily.  . Coenzyme Q10 (CO Q 10) 100 MG CAPS Take 1 capsule by mouth daily.   . Cyanocobalamin (VITAMIN B-12) 5000 MCG TBDP Take 5,000 mcg by mouth daily.  . diphenoxylate-atropine (LOMOTIL) 2.5-0.025 MG tablet Take 1 tablet by mouth every 6 (six) hours as needed for diarrhea or loose stools (stomach cramps). (Patient not taking: Reported on 05/27/2018)  . Flax Oil-Fish Oil-Borage Oil (FISH OIL-FLAX OIL-BORAGE OIL) CAPS Take 1 capsule by mouth daily.  . fluorouracil CALGB 66440 in sodium chloride 0.9 % 150 mL Inject into the vein over 96 hr.  . Garlic 3474 MG CAPS Take 1 capsule by mouth daily.  Marland Kitchen glipiZIDE (  GLIPIZIDE XL) 5 MG 24 hr tablet Take 1 tablet (5 mg total) by mouth daily with breakfast.  . glipiZIDE (GLUCOTROL XL) 10 MG 24 hr tablet Take 10 mg by mouth daily.   . Glucosamine-Chondroit-Collagen ([employer] GLUCO-CHONDROIT PLUS UC-II PO) Take 1 tablet by mouth daily.   . IRINOTECAN HCL IV Inject into the vein.  Marland Kitchen LECITHIN PO Take 1 capsule by mouth daily.  Marland Kitchen LEUCOVORIN CALCIUM IV Inject into the vein.  Marland Kitchen lidocaine-prilocaine (EMLA) cream Apply small amount over port site and cover with plastic wrap one hour prior to appointment.  . magnesium oxide (MAG-OX) 400 MG tablet Take 400 mg by mouth daily.   . metFORMIN (GLUCOPHAGE) 500 MG tablet Take 1 tablet (500 mg total) by mouth 2 (two) times daily with a meal. (Patient taking differently: Take 500 mg by mouth daily with breakfast. )  . milk thistle 175 MG tablet Take 175 mg by mouth daily.  Marland Kitchen OVER THE COUNTER MEDICATION Take 1 capsule by mouth daily. Mushroom Complex 750 mg  . OVER THE COUNTER MEDICATION Take 1 capsule by mouth daily. Cura Med 750 mg  . oxyCODONE-acetaminophen (PERCOCET/ROXICET) 5-325 MG tablet Take 1 tablet by mouth every 6 (six) hours as needed. for pain  . prochlorperazine (COMPAZINE) 10 MG tablet Take 1 tablet (10 mg total) by mouth every 6  (six) hours as needed for nausea or vomiting. (Patient not taking: Reported on 05/27/2018)  . valsartan-hydrochlorothiazide (DIOVAN-HCT) 160-25 MG tablet Take 1 tablet by mouth daily.   Facility-Administered Encounter Medications as of 06/10/2018  Medication  . sodium chloride flush (NS) 0.9 % injection 10 mL    ALLERGIES:  No Known Allergies   PHYSICAL EXAM:  ECOG Performance status: ***  There were no vitals filed for this visit. There were no vitals filed for this visit.  Physical Exam   LABORATORY DATA:  I have reviewed the labs as listed.  CBC    Component Value Date/Time   WBC 6.2 05/27/2018 0954   RBC 4.15 (L) 05/27/2018 0954   HGB 11.3 (L) 05/27/2018 0954   HCT 37.0 (L) 05/27/2018 0954   PLT 245 05/27/2018 0954   MCV 89.2 05/27/2018 0954   MCH 27.2 05/27/2018 0954   MCHC 30.5 05/27/2018 0954   RDW 16.0 (H) 05/27/2018 0954   LYMPHSABS 1.3 05/27/2018 0954   MONOABS 0.5 05/27/2018 0954   EOSABS 0.3 05/27/2018 0954   BASOSABS 0.1 05/27/2018 0954   CMP Latest Ref Rng & Units 05/27/2018 05/13/2018 05/04/2018  Glucose 70 - 99 mg/dL 367(H) 273(H) 482(H)  BUN 8 - 23 mg/dL '15 17 19  ' Creatinine 0.61 - 1.24 mg/dL 0.98 1.18 1.11  Sodium 135 - 145 mmol/L 135 133(L) 128(L)  Potassium 3.5 - 5.1 mmol/L 4.4 3.5 4.3  Chloride 98 - 111 mmol/L 97(L) 95(L) 91(L)  CO2 22 - 32 mmol/L '28 28 25  ' Calcium 8.9 - 10.3 mg/dL 9.6 9.2 9.0  Total Protein 6.5 - 8.1 g/dL 7.8 8.2(H) 7.7  Total Bilirubin 0.3 - 1.2 mg/dL 0.8 0.5 0.5  Alkaline Phos 38 - 126 U/L 133(H) 144(H) 140(H)  AST 15 - 41 U/L '19 19 16  ' ALT 0 - 44 U/L '16 17 17       ' DIAGNOSTIC IMAGING:  I have independently reviewed the scans and discussed with the patient.   I have reviewed Francene Finders, NP's note and agree with the documentation.  I personally performed a face-to-face visit, made revisions and my assessment and plan is as follows.  ASSESSMENT & PLAN:   No problem-specific Assessment & Plan notes found for  this encounter.      Orders placed this encounter:  No orders of the defined types were placed in this encounter.     Derek Jack, MD [place] [phone]

## 2018-06-12 ENCOUNTER — Encounter (HOSPITAL_COMMUNITY): Payer: Medicare PPO

## 2018-06-15 ENCOUNTER — Other Ambulatory Visit (HOSPITAL_COMMUNITY): Payer: Medicare PPO

## 2018-06-16 ENCOUNTER — Encounter (HOSPITAL_COMMUNITY): Payer: Self-pay

## 2018-06-16 ENCOUNTER — Ambulatory Visit (HOSPITAL_COMMUNITY)
Admission: RE | Admit: 2018-06-16 | Discharge: 2018-06-16 | Disposition: A | Discharge status: Home / Self Care | Payer: Medicare PPO | Source: Ambulatory Visit | Attending: Nurse Practitioner | Admitting: Nurse Practitioner

## 2018-06-16 DIAGNOSIS — C189 Malignant neoplasm of colon, unspecified: Secondary | ICD-10-CM | POA: Insufficient documentation

## 2018-06-16 DIAGNOSIS — C787 Secondary malignant neoplasm of liver and intrahepatic bile duct: Secondary | ICD-10-CM | POA: Insufficient documentation

## 2018-06-16 DIAGNOSIS — C7800 Secondary malignant neoplasm of unspecified lung: Secondary | ICD-10-CM | POA: Diagnosis not present

## 2018-06-16 DIAGNOSIS — C229 Malignant neoplasm of liver, not specified as primary or secondary: Secondary | ICD-10-CM | POA: Diagnosis not present

## 2018-06-16 MED ORDER — IOHEXOL 300 MG/ML  SOLN
100.0000 mL | Freq: Once | INTRAMUSCULAR | Status: AC | PRN
  Administered 2018-06-16: 100 mL via INTRAVENOUS

## 2018-06-16 MED ORDER — SODIUM CHLORIDE (PF) 0.9 % IJ SOLN
INTRAMUSCULAR | Status: AC
  Filled 2018-06-16: qty 50

## 2018-06-17 DIAGNOSIS — E1165 Type 2 diabetes mellitus with hyperglycemia: Secondary | ICD-10-CM | POA: Diagnosis not present

## 2018-06-17 DIAGNOSIS — E782 Mixed hyperlipidemia: Secondary | ICD-10-CM | POA: Diagnosis not present

## 2018-06-17 DIAGNOSIS — I10 Essential (primary) hypertension: Secondary | ICD-10-CM | POA: Diagnosis not present

## 2018-06-17 DIAGNOSIS — C187 Malignant neoplasm of sigmoid colon: Secondary | ICD-10-CM | POA: Diagnosis not present

## 2018-06-18 ENCOUNTER — Encounter (HOSPITAL_COMMUNITY): Payer: Self-pay | Admitting: Hematology

## 2018-06-18 ENCOUNTER — Inpatient Hospital Stay (HOSPITAL_COMMUNITY): Payer: Medicare PPO | Attending: Hematology | Admitting: Hematology

## 2018-06-18 VITALS — BP 138/83 | HR 90 | Temp 98.6°F | Resp 18 | Wt 205.0 lb

## 2018-06-18 DIAGNOSIS — Z7984 Long term (current) use of oral hypoglycemic drugs: Secondary | ICD-10-CM | POA: Diagnosis not present

## 2018-06-18 DIAGNOSIS — C787 Secondary malignant neoplasm of liver and intrahepatic bile duct: Secondary | ICD-10-CM | POA: Insufficient documentation

## 2018-06-18 DIAGNOSIS — R1011 Right upper quadrant pain: Secondary | ICD-10-CM | POA: Diagnosis not present

## 2018-06-18 DIAGNOSIS — E1142 Type 2 diabetes mellitus with diabetic polyneuropathy: Secondary | ICD-10-CM

## 2018-06-18 DIAGNOSIS — Z87891 Personal history of nicotine dependence: Secondary | ICD-10-CM | POA: Diagnosis not present

## 2018-06-18 DIAGNOSIS — C187 Malignant neoplasm of sigmoid colon: Secondary | ICD-10-CM | POA: Insufficient documentation

## 2018-06-18 DIAGNOSIS — I10 Essential (primary) hypertension: Secondary | ICD-10-CM | POA: Diagnosis not present

## 2018-06-18 DIAGNOSIS — Z7189 Other specified counseling: Secondary | ICD-10-CM

## 2018-06-18 DIAGNOSIS — C7800 Secondary malignant neoplasm of unspecified lung: Secondary | ICD-10-CM | POA: Diagnosis not present

## 2018-06-18 DIAGNOSIS — C189 Malignant neoplasm of colon, unspecified: Secondary | ICD-10-CM

## 2018-06-18 NOTE — Patient Instructions (Signed)
[facility] at [facility] Discharge Instructions     Thank you for choosing [facility] at [facility] to provide your oncology and hematology care.  To afford each patient quality time with our provider, please arrive at least 15 minutes before your scheduled appointment time.   If you have a lab appointment with the Cancer Center please come in thru the  Main Entrance and check in at the main information desk  You need to re-schedule your appointment should you arrive 10 or more minutes late.  We strive to give you quality time with our providers, and arriving late affects you and other patients whose appointments are after yours.  Also, if you no show three or more times for appointments you may be dismissed from the clinic at the providers discretion.     Again, thank you for choosing [facility].  Our hope is that these requests will decrease the amount of time that you wait before being seen by our physicians.       _____________________________________________________________  Should you have questions after your visit to  Cancer Center, please contact our office at [phone] between the hours of 8:00 a.m. and 4:30 p.m.  Voicemails left after 4:00 p.m. will not be returned until the following business day.  For prescription refill requests, have your pharmacy contact our office and allow 72 hours.    Cancer Center Support Programs:   > Cancer Support Group  2nd Tuesday of the month 1pm-2pm, Journey Room    

## 2018-06-18 NOTE — Progress Notes (Signed)
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 ([place])   05/28/2017 - 06/09/2018 Chemotherapy    The patient had palonosetron (ALOXI) injection 0.25 mg, 0.25 mg, Intravenous,  Once, 18 of 22 cycles Administration: 0.25 mg (09/03/2017), 0.25 mg (10/01/2017), 0.25 mg (10/15/2017), 0.25 mg (10/29/2017), 0.25 mg (09/17/2017), 0.25 mg (03/02/2018), 0.25 mg (03/16/2018), 0.25 mg (03/30/2018), 0.25 mg (04/13/2018), 0.25 mg (05/13/2018), 0.25 mg (05/27/2018) irinotecan (CAMPTOSAR) 400 mg in dextrose 5 % 500 mL chemo infusion, 180 mg/m2, Intravenous,  Once, 18 of 22 cycles Dose modification: 144 mg/m2 (Cycle 4, Reason: Provider Judgment) Administration: 320 mg (09/03/2017), 320 mg (10/01/2017), 320 mg (10/15/2017), 320 mg (10/29/2017), 320 mg (09/17/2017), 320 mg (03/02/2018), 320 mg (03/16/2018), 320 mg (03/30/2018), 320 mg (04/13/2018), 320 mg (05/13/2018), 320 mg (05/27/2018) leucovorin 888 mg in dextrose 5 % 250 mL infusion, 400 mg/m2, Intravenous,  Once, 18 of 22 cycles Administration: 900 mg (09/03/2017), 900 mg (10/01/2017), 900 mg (10/15/2017), 900 mg (10/29/2017), 900 mg (09/17/2017), 900 mg (03/02/2018), 900 mg (03/16/2018), 900 mg (03/30/2018), 900 mg (04/13/2018), 900 mg (05/13/2018), 900 mg (05/27/2018) fluorouracil (ADRUCIL) chemo injection 900 mg, 400 mg/m2, Intravenous,  Once, 18 of 22 cycles Dose modification: 320 mg/m2 (Cycle 4, Reason: Provider Judgment) Administration: 700 mg (09/03/2017), 700 mg (10/01/2017), 700 mg (10/15/2017), 700 mg (10/29/2017), 700 mg (09/17/2017), 700 mg (03/02/2018), 700 mg (03/16/2018), 700 mg (03/30/2018), 700 mg (04/13/2018), 700 mg (05/13/2018), 700 mg (05/27/2018) fluorouracil (ADRUCIL)  5,350 mg in sodium chloride 0.9 % 143 mL chemo infusion, 2,400 mg/m2, Intravenous, 1 Day/Dose, 18 of 22 cycles Dose modification: 1,920 mg/m2 (Cycle 4, Reason: Provider Judgment) Administration: 4,250 mg (09/03/2017), 4,250 mg (10/01/2017), 4,250 mg (10/15/2017), 4,250 mg (10/29/2017), 4,250 mg (09/17/2017), 4,250 mg (03/02/2018), 4,250 mg (03/16/2018), 4,250 mg (03/30/2018), 4,250 mg (04/13/2018), 4,250 mg (05/13/2018), 4,250 mg (05/27/2018)  for chemotherapy treatment.     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[facility])    06/22/2018 -  Chemotherapy    The patient had palonosetron (ALOXI) injection 0.25 mg, 0.25 mg, Intravenous,  Once, 0 of 4 cycles leucovorin 864 mg in dextrose 5 % 250 mL infusion, 400 mg/m2, Intravenous,  Once, 0 of 4 cycles oxaliplatin (ELOXATIN) 185 mg in dextrose 5 % 500 mL chemo infusion, 85 mg/m2, Intravenous,  Once, 0 of 4 cycles fluorouracil (ADRUCIL) chemo injection 850 mg, 400 mg/m2, Intravenous,  Once, 0 of 4 cycles fluorouracil (ADRUCIL) 5,200 mg in sodium chloride 0.9 % 146 mL chemo infusion, 2,400 mg/m2, Intravenous, 1 Day/Dose, 0 of 4 cycles  for chemotherapy treatment.       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 Kuhner 68 y.o. male returns for routine follow-up for metastatic colon cancer to the liver. He is having right upper quadrant pain. He also has slight numbness and tingling in his feet at night. He reports it is better than it was. Denies any nausea, vomiting, or diarrhea. Had not noticed any recent bleeding such as  epistaxis, hematuria or hematochezia. Denies recent chest pain on exertion, shortness of breath on minimal exertion, pre-syncopal episodes, or palpitations. Denies any recent fevers, infections, or  recent hospitalizations. Patient reports appetite at 100% and energy level at 100%. He is eating well and maintaining his weight.    REVIEW OF SYSTEMS:  Review of Systems  Gastrointestinal: Positive for abdominal pain.  All other systems reviewed and are negative.    PAST MEDICAL/SURGICAL HISTORY:  Past Medical History:  Diagnosis Date  . Colon cancer ([facility] [facility])   . Diabetes ([place])   . GERD (gastroesophageal reflux disease)   . Hypertension    Past Surgical History:  Procedure Laterality Date  . APPENDECTOMY     during colon surgery  . COLON SURGERY    . IR RADIOLOGIST EVAL & MGMT  11/26/2017  . RADIOFREQUENCY ABLATION N/A 12/10/2017   Procedure: CT MICROWAVE THERMAL ABLATION (LIVER);  Surgeon: Markus Daft, MD;  Location: [facility];  Service: Anesthesiology;  Laterality: N/A;  . SHOULDER SURGERY  2003     SOCIAL HISTORY:  Social History   Socioeconomic History  . Marital status: Married    Spouse name: Not on file  . Number of children: Not on file  . Years of education: Not on file  . Highest education level: Not on file  Occupational History  . Not on file  Social Needs  . Financial resource strain: Not on file  . Food insecurity:    Worry: Not on file    Inability: Not on file  . Transportation needs:    Medical: Not on file    Non-medical: Not on file  Tobacco Use  . Smoking status: Former Smoker    Last attempt to quit: 04/05/1995    Years since quitting: 23.2  . Smokeless tobacco: Never Used  Substance and Sexual Activity  . Alcohol use: Not Currently    Alcohol/week: 0.0 standard drinks    Comment: used to drink but stopped about 20 years ago  . Drug use: Never  . Sexual activity: Not on file  Lifestyle  . Physical activity:    Days per week: Not on file    Minutes per session: Not on file  . Stress: Not on file  Relationships  . Social connections:    Talks on phone: Not on file    Gets together: Not on file    Attends religious service: Not on file     Active member of club or organization: Not on file    Attends meetings of clubs or organizations: Not on file    Relationship status: Not on file  . Intimate partner violence:    Fear of current or ex partner: Not on file    Emotionally abused: Not on file    Physically abused: Not on file    Forced sexual activity: Not on file  Other Topics Concern  . Not on file  Social History Narrative  . Not on file    FAMILY HISTORY:  Family History  Problem Relation Age of Onset  . Diabetes Mother   . Cancer Father   . Bone cancer Father   . Colon cancer Neg Hx   . Colon polyps Neg Hx     CURRENT MEDICATIONS:  Outpatient Encounter Medications as of 06/18/2018  Medication Sig  . Alpha-Lipoic Acid 200 MG CAPS Take 1 capsule by mouth daily.  . Ascorbic Acid (VITAMIN C) 1000 MG tablet Take 1,000 mg by mouth daily.  . ASHWAGANDHA PO Take  800 mg by mouth daily.  Marland Kitchen aspirin 81 MG tablet Take 81 mg by mouth daily.  Marland Kitchen atorvastatin (LIPITOR) 10 MG tablet Take 10 mg by mouth daily.   Marland Kitchen BEE POLLEN PO Take 1 capsule by mouth daily.   . Blood Glucose Monitoring Suppl (ONE TOUCH ULTRA MINI) W/DEVICE KIT USE TO CHECK BLOOD SUGAR DAILY.  . cholecalciferol (VITAMIN D) 1000 units tablet Take 1,000 Units by mouth daily.  Marland Kitchen CINNAMON PO Take 1,000 mg by mouth daily.  Marland Kitchen Cod Liver Oil CAPS Take 1 capsule by mouth daily.  . Coenzyme Q10 (CO Q 10) 100 MG CAPS Take 1 capsule by mouth daily.   . Cyanocobalamin (VITAMIN B-12) 5000 MCG TBDP Take 5,000 mcg by mouth daily.  . diphenoxylate-atropine (LOMOTIL) 2.5-0.025 MG tablet Take 1 tablet by mouth every 6 (six) hours as needed for diarrhea or loose stools (stomach cramps).  . Flax Oil-Fish Oil-Borage Oil (FISH OIL-FLAX OIL-BORAGE OIL) CAPS Take 1 capsule by mouth daily.  . fluorouracil CALGB 70962 in sodium chloride 0.9 % 150 mL Inject into the vein over 96 hr.  . Garlic 8366 MG CAPS Take 1 capsule by mouth daily.  Marland Kitchen glipiZIDE (GLIPIZIDE XL) 5 MG 24 hr tablet  Take 1 tablet (5 mg total) by mouth daily with breakfast.  . glipiZIDE (GLUCOTROL XL) 10 MG 24 hr tablet Take 10 mg by mouth daily.   . Glucosamine-Chondroit-Collagen ([employer] GLUCO-CHONDROIT PLUS UC-II PO) Take 1 tablet by mouth daily.   . IRINOTECAN HCL IV Inject into the vein.  Marland Kitchen LECITHIN PO Take 1 capsule by mouth daily.  Marland Kitchen LEUCOVORIN CALCIUM IV Inject into the vein.  Marland Kitchen lidocaine-prilocaine (EMLA) cream Apply small amount over port site and cover with plastic wrap one hour prior to appointment.  . magnesium oxide (MAG-OX) 400 MG tablet Take 400 mg by mouth daily.   . metFORMIN (GLUCOPHAGE) 500 MG tablet Take 1 tablet (500 mg total) by mouth 2 (two) times daily with a meal. (Patient taking differently: Take 500 mg by mouth daily with breakfast. )  . milk thistle 175 MG tablet Take 175 mg by mouth daily.  Marland Kitchen OVER THE COUNTER MEDICATION Take 1 capsule by mouth daily. Mushroom Complex 750 mg  . OVER THE COUNTER MEDICATION Take 1 capsule by mouth daily. Cura Med 750 mg  . oxyCODONE-acetaminophen (PERCOCET/ROXICET) 5-325 MG tablet Take 1 tablet by mouth every 6 (six) hours as needed. for pain  . prochlorperazine (COMPAZINE) 10 MG tablet Take 1 tablet (10 mg total) by mouth every 6 (six) hours as needed for nausea or vomiting.  . valsartan-hydrochlorothiazide (DIOVAN-HCT) 160-25 MG tablet Take 1 tablet by mouth daily.   Facility-Administered Encounter Medications as of 06/18/2018  Medication  . sodium chloride flush (NS) 0.9 % injection 10 mL    ALLERGIES:  No Known Allergies   PHYSICAL EXAM:  ECOG Performance status: 1  Vitals:   06/18/18 0759  BP: 138/83  Pulse: 90  Resp: 18  Temp: 98.6 F (37 C)  SpO2: 98%   Filed Weights   06/18/18 0759  Weight: 205 lb (93 kg)    Physical Exam Constitutional:      Appearance: Normal appearance. He is normal weight.  Cardiovascular:     Rate and Rhythm: Normal rate and regular rhythm.     Heart sounds: Normal heart sounds.  Pulmonary:      Effort: Pulmonary effort is normal.     Breath sounds: Normal breath sounds.  Abdominal:  General: Abdomen is flat.     Palpations: Abdomen is soft.  Musculoskeletal: Normal range of motion.  Skin:    General: Skin is warm and dry.  Neurological:     Mental Status: He is alert and oriented to person, place, and time. Mental status is at baseline.  Psychiatric:        Mood and Affect: Mood normal.        Behavior: Behavior normal.        Thought Content: Thought content normal.        Judgment: Judgment normal.      LABORATORY DATA:  I have reviewed the labs as listed.  CBC    Component Value Date/Time   WBC 6.2 05/27/2018 0954   RBC 4.15 (L) 05/27/2018 0954   HGB 11.3 (L) 05/27/2018 0954   HCT 37.0 (L) 05/27/2018 0954   PLT 245 05/27/2018 0954   MCV 89.2 05/27/2018 0954   MCH 27.2 05/27/2018 0954   MCHC 30.5 05/27/2018 0954   RDW 16.0 (H) 05/27/2018 0954   LYMPHSABS 1.3 05/27/2018 0954   MONOABS 0.5 05/27/2018 0954   EOSABS 0.3 05/27/2018 0954   BASOSABS 0.1 05/27/2018 0954   CMP Latest Ref Rng & Units 05/27/2018 05/13/2018 05/04/2018  Glucose 70 - 99 mg/dL 367(H) 273(H) 482(H)  BUN 8 - 23 mg/dL '15 17 19  ' Creatinine 0.61 - 1.24 mg/dL 0.98 1.18 1.11  Sodium 135 - 145 mmol/L 135 133(L) 128(L)  Potassium 3.5 - 5.1 mmol/L 4.4 3.5 4.3  Chloride 98 - 111 mmol/L 97(L) 95(L) 91(L)  CO2 22 - 32 mmol/L '28 28 25  ' Calcium 8.9 - 10.3 mg/dL 9.6 9.2 9.0  Total Protein 6.5 - 8.1 g/dL 7.8 8.2(H) 7.7  Total Bilirubin 0.3 - 1.2 mg/dL 0.8 0.5 0.5  Alkaline Phos 38 - 126 U/L 133(H) 144(H) 140(H)  AST 15 - 41 U/L '19 19 16  ' ALT 0 - 44 U/L '16 17 17       ' DIAGNOSTIC IMAGING:  I have independently reviewed the scans and discussed with the patient.   I have reviewed Francene Finders, NP's note and agree with the documentation.  I personally performed a face-to-face visit, made revisions and my assessment and plan is as follows.    ASSESSMENT & PLAN:   Adenocarcinoma of colon  metastatic to liver ([place]) 1.  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 - He has completed 12 cycles of FOLFIRI on 10/29/2017.  He tolerated it very well.  CT CAP showed stable left upper lobe subcentimeter lung nodule, segment 4 lesion in the liver has decreased to 2.4 cm.  -He underwent left hepatic lobe microwave ablation on 12/10/2017.  He did very well with minimal pain and complications.  He is able to do all his day-to-day activities without any problems. -CT scan of the abdomen pelvis on 02/05/2018 showed left hepatic lesion with central high attenuation and a large area of low-attenuation.  There is a new left hepatic lobe lesion measuring 1.3 cm.  CT chest showed similar appearing small nodules. - MRI of the liver dated 02/25/2018 shows heterogeneous mass which surrounds the right side of the ablation defect and extends inferiorly in segment 4B measuring 6.9 x 5.6 cm, segment 2 lesion measuring 2.6 cm, segment 8 subcapsular 1.6 cm lesion.  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 -  He has at least 3 new lesions and questionable capsular metastasis.  I do not believe he is a candidate for local therapy.  I have recommended going back on chemotherapy.  His last chemo was in July 2019, dose reduced FOLFIRI. - FOLFIRI was started back on 03/02/2018. - Cycle 3 chemotherapy was on 03/30/2018.  He did have a history of rectal bleed with Avastin.  Hence it was not added. - He developed fever after cycle 3 and work-up came back negative. -He tolerated subsequent cycles fairly well.  Cycle 5 was on 05/13/2018. -CEA level on 05/04/2018 has improved to 252.  This was previously 271 on 04/13/2018. - We have reviewed his blood work today.  He may proceed with cycle 6 today.  I plan to repeat CT scan of his CAP  prior to next visit.  We will also follow-up on CEA level from today.  2.  Diabetes: -he is taking metformin 500 mg daily.  If he increases it, he gets diarrhea. - He is also on Glucotrol XL 10 mg daily with breakfast.  His sugars have been in the range of 300-400.  I will increase it to 15 mg daily.  We have sent a prescription for 5 mg tablet to be taken along with the 10 mg tablet.  3.  Peripheral neuropathy: -This is likely from diabetes.  He has mild numbness in the feet predominantly at bedtime.  He never received oxaliplatin based chemotherapy.  4.  Hypertension: This is well controlled on valsartan HCTZ 160-25 mg daily.  Total time spent is 40 minutes discussing scan results, further plan of action, further chemotherapy, side effects and coordination of care.    Orders placed this encounter:  Orders Placed This Encounter  Procedures  . US BIOPSY (LIVER)  . Magnesium  . CBC with Differential/Platelet  . Comprehensive metabolic panel  . CEA      Derek Jack, MD [place] [phone]

## 2018-06-18 NOTE — Assessment & Plan Note (Signed)
1.  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 - He has completed 12 cycles of FOLFIRI on 10/29/2017.  He tolerated it very well.  CT CAP showed stable left upper lobe subcentimeter lung nodule, segment 4 lesion in the liver has decreased to 2.4 cm.  -He underwent left hepatic lobe microwave ablation on 12/10/2017.  He did very well with minimal pain and complications.  He is able to do all his day-to-day activities without any problems. -CT scan of the abdomen pelvis on 02/05/2018 showed left hepatic lesion with central high attenuation and a large area of low-attenuation.  There is a new left hepatic lobe lesion measuring 1.3 cm.  CT chest showed similar appearing small nodules. - MRI of the liver dated 02/25/2018 shows heterogeneous mass which surrounds the right side of the ablation defect and extends inferiorly in segment 4B measuring 6.9 x 5.6 cm, segment 2 lesion measuring 2.6 cm, segment 8 subcapsular 1.6 cm lesion.  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 - He has at least 3 new lesions and questionable capsular metastasis.  I do not believe he is a candidate for local therapy.  I have recommended going back on chemotherapy.  His last chemo was in July 2019, dose reduced FOLFIRI. - FOLFIRI was started back on 03/02/2018. - Cycle 3 chemotherapy was on 03/30/2018.  He did have a history of rectal bleed with Avastin.  Hence it was not added. - He developed fever after cycle 3 and work-up came back negative. -He tolerated subsequent cycles fairly well.  Cycle 5 was on 05/13/2018. -CEA level on 05/04/2018 has improved to 252.  This was previously 271 on 04/13/2018. - We have reviewed his blood work today.  He may proceed with cycle 6 today.  I plan to repeat CT scan of his CAP prior to next visit.  We will  also follow-up on CEA level from today.  2.  Diabetes: -he is taking metformin 500 mg daily.  If he increases it, he gets diarrhea. - He is also on Glucotrol XL 10 mg daily with breakfast.  His sugars have been in the range of 300-400.  I will increase it to 15 mg daily.  We have sent a prescription for 5 mg tablet to be taken along with the 10 mg tablet.  3.  Peripheral neuropathy: -This is likely from diabetes.  He has mild numbness in the feet predominantly at bedtime.  He never received oxaliplatin based chemotherapy.  4.  Hypertension: This is well controlled on valsartan HCTZ 160-25 mg daily.

## 2018-06-18 NOTE — Progress Notes (Signed)
START ON PATHWAY REGIMEN - Colorectal     A cycle is every 14 days:     Oxaliplatin      Leucovorin      5-Fluorouracil      5-Fluorouracil   **Always confirm dose/schedule in your pharmacy ordering system**  Patient Characteristics: Distant Metastases, Second Line, KRAS Mutation Positive/Unknown, BRAF Wild-Type/Unknown, Bevacizumab Ineligible Therapeutic Status: Distant Metastases BRAF Mutation Status: Wild-Type (no mutation) KRAS/NRAS Mutation Status: Mutation Positive Line of Therapy: Second Line  Intent of Therapy: Non-Curative / Palliative Intent, Discussed with Patient

## 2018-06-19 DIAGNOSIS — E1165 Type 2 diabetes mellitus with hyperglycemia: Secondary | ICD-10-CM | POA: Diagnosis not present

## 2018-06-22 ENCOUNTER — Ambulatory Visit (HOSPITAL_COMMUNITY): Payer: Medicare PPO

## 2018-06-22 ENCOUNTER — Other Ambulatory Visit: Payer: Self-pay | Admitting: Physician Assistant

## 2018-06-22 ENCOUNTER — Other Ambulatory Visit (HOSPITAL_COMMUNITY): Payer: Medicare PPO

## 2018-06-23 ENCOUNTER — Ambulatory Visit (HOSPITAL_COMMUNITY)
Admission: RE | Admit: 2018-06-23 | Discharge: 2018-06-23 | Disposition: A | Discharge status: Home / Self Care | Payer: Medicare PPO | Source: Ambulatory Visit | Attending: Nurse Practitioner | Admitting: Nurse Practitioner

## 2018-06-23 ENCOUNTER — Encounter (HOSPITAL_COMMUNITY): Payer: Self-pay

## 2018-06-23 ENCOUNTER — Ambulatory Visit (HOSPITAL_COMMUNITY)
Admission: RE | Admit: 2018-06-23 | Discharge: 2018-06-23 | Disposition: A | Discharge status: Home / Self Care | Payer: Medicare PPO | Source: Ambulatory Visit | Attending: Hematology | Admitting: Hematology

## 2018-06-23 ENCOUNTER — Other Ambulatory Visit (HOSPITAL_COMMUNITY): Payer: Self-pay | Admitting: Hematology

## 2018-06-23 DIAGNOSIS — Z79899 Other long term (current) drug therapy: Secondary | ICD-10-CM | POA: Diagnosis not present

## 2018-06-23 DIAGNOSIS — I10 Essential (primary) hypertension: Secondary | ICD-10-CM | POA: Diagnosis not present

## 2018-06-23 DIAGNOSIS — Z7984 Long term (current) use of oral hypoglycemic drugs: Secondary | ICD-10-CM | POA: Insufficient documentation

## 2018-06-23 DIAGNOSIS — C189 Malignant neoplasm of colon, unspecified: Secondary | ICD-10-CM

## 2018-06-23 DIAGNOSIS — C787 Secondary malignant neoplasm of liver and intrahepatic bile duct: Secondary | ICD-10-CM | POA: Diagnosis not present

## 2018-06-23 DIAGNOSIS — Z808 Family history of malignant neoplasm of other organs or systems: Secondary | ICD-10-CM | POA: Insufficient documentation

## 2018-06-23 DIAGNOSIS — Z87891 Personal history of nicotine dependence: Secondary | ICD-10-CM | POA: Insufficient documentation

## 2018-06-23 DIAGNOSIS — R160 Hepatomegaly, not elsewhere classified: Secondary | ICD-10-CM | POA: Diagnosis not present

## 2018-06-23 DIAGNOSIS — E119 Type 2 diabetes mellitus without complications: Secondary | ICD-10-CM | POA: Diagnosis not present

## 2018-06-23 DIAGNOSIS — Z833 Family history of diabetes mellitus: Secondary | ICD-10-CM | POA: Diagnosis present

## 2018-06-23 DIAGNOSIS — Z7982 Long term (current) use of aspirin: Secondary | ICD-10-CM | POA: Insufficient documentation

## 2018-06-23 DIAGNOSIS — C7800 Secondary malignant neoplasm of unspecified lung: Secondary | ICD-10-CM | POA: Diagnosis not present

## 2018-06-23 LAB — CBC
HCT: 38.8 % — ABNORMAL LOW (ref 39.0–52.0)
Hemoglobin: 11.9 g/dL — ABNORMAL LOW (ref 13.0–17.0)
MCH: 28.1 pg (ref 26.0–34.0)
MCHC: 30.7 g/dL (ref 30.0–36.0)
MCV: 91.7 fL (ref 80.0–100.0)
NRBC: 0 % (ref 0.0–0.2)
Platelets: 205 10*3/uL (ref 150–400)
RBC: 4.23 MIL/uL (ref 4.22–5.81)
RDW: 16.8 % — ABNORMAL HIGH (ref 11.5–15.5)
WBC: 10.5 10*3/uL (ref 4.0–10.5)

## 2018-06-23 LAB — PROTIME-INR
INR: 1 (ref 0.8–1.2)
Prothrombin Time: 12.6 seconds (ref 11.4–15.2)

## 2018-06-23 LAB — GLUCOSE, CAPILLARY: GLUCOSE-CAPILLARY: 102 mg/dL — AB (ref 70–99)

## 2018-06-23 MED ORDER — MIDAZOLAM HCL 2 MG/2ML IJ SOLN
INTRAMUSCULAR | Status: AC | PRN
  Administered 2018-06-23 (×2): 1 mg via INTRAVENOUS

## 2018-06-23 MED ORDER — MIDAZOLAM HCL 2 MG/2ML IJ SOLN
INTRAMUSCULAR | Status: AC
  Filled 2018-06-23: qty 2

## 2018-06-23 MED ORDER — GELATIN ABSORBABLE 12-7 MM EX MISC
CUTANEOUS | Status: AC
  Filled 2018-06-23: qty 1

## 2018-06-23 MED ORDER — FENTANYL CITRATE (PF) 100 MCG/2ML IJ SOLN
INTRAMUSCULAR | Status: AC | PRN
  Administered 2018-06-23: 50 ug via INTRAVENOUS

## 2018-06-23 MED ORDER — LIDOCAINE HCL 1 % IJ SOLN
INTRAMUSCULAR | Status: AC
  Filled 2018-06-23: qty 20

## 2018-06-23 MED ORDER — LIDOCAINE HCL (PF) 1 % IJ SOLN
INTRAMUSCULAR | Status: AC | PRN
  Administered 2018-06-23: 10 mL

## 2018-06-23 MED ORDER — FENTANYL CITRATE (PF) 100 MCG/2ML IJ SOLN
INTRAMUSCULAR | Status: AC
  Filled 2018-06-23: qty 2

## 2018-06-23 MED ORDER — SODIUM CHLORIDE 0.9 % IV SOLN
INTRAVENOUS | Status: DC
  Administered 2018-06-23: 12:00:00 via INTRAVENOUS

## 2018-06-23 NOTE — Procedures (Signed)
Liver mets, colon ca  S/p LEFT LIVER MASS [place] CORE BX  No comp Stable EBL min Path pending Full report in pacs

## 2018-06-23 NOTE — H&P (Signed)
Chief Complaint: Metastatic colon cancer  Referring Physician(s): Lockamy,Randi L  Supervising Physician: Daryll Brod  Patient Status: [facility] - Out-pt  History of Present Illness: Christopher Friedman is a 68 y.o. male who is known to our service.  He has undergone microwave ablation with Dr. Anselm Pancoast for hepatic metastasis on 12/10/2017.  He has also undergone chemotherapy.  MRI done 02/25/18 showed new and progressive hepatic metastasis.  CT scan done on 06/17/18 showed marked progression of disease.  We are asked to perform a biopsy of a liver lesion.  He is NPO. No blood thinners.  He feels ok today. Some RUQ pain. No nausea/vomiting. No Fever/chills. ROS negative.   Past Medical History:  Diagnosis Date  . Colon cancer ([place])   . Diabetes ([place])   . GERD (gastroesophageal reflux disease)   . Hypertension     Past Surgical History:  Procedure Laterality Date  . APPENDECTOMY     during colon surgery  . COLON SURGERY    . IR RADIOLOGIST EVAL & MGMT  11/26/2017  . RADIOFREQUENCY ABLATION N/A 12/10/2017   Procedure: CT MICROWAVE THERMAL ABLATION (LIVER);  Surgeon: Markus Daft, MD;  Location: [facility] ORS;  Service: Anesthesiology;  Laterality: N/A;  . SHOULDER SURGERY  2003    Allergies: Patient has no known allergies.  Medications: Prior to Admission medications   Medication Sig Start Date End Date Taking? Authorizing Provider  Alpha-Lipoic Acid 200 MG CAPS Take 1 capsule by mouth daily.   Yes [provider]  Ascorbic Acid (VITAMIN C) 1000 MG tablet Take 1,000 mg by mouth daily.   Yes [provider]  ASHWAGANDHA PO Take 800 mg by mouth daily.   Yes [provider]  aspirin 81 MG tablet Take 81 mg by mouth daily.   Yes [provider]  atorvastatin (LIPITOR) 10 MG tablet Take 10 mg by mouth daily.  07/22/17  Yes [provider]  BEE POLLEN PO Take 1 capsule by mouth daily.    Yes [provider]  cholecalciferol  (VITAMIN D) 1000 units tablet Take 1,000 Units by mouth daily.   Yes [provider]  CINNAMON PO Take 1,000 mg by mouth daily.   Yes [provider]  Cod Liver Oil CAPS Take 1 capsule by mouth daily.   Yes [provider]  Coenzyme Q10 (CO Q 10) 100 MG CAPS Take 1 capsule by mouth daily.    Yes [provider]  Cyanocobalamin (VITAMIN B-12) 5000 MCG TBDP Take 5,000 mcg by mouth daily.   Yes [provider]  diphenoxylate-atropine (LOMOTIL) 2.5-0.025 MG tablet Take 1 tablet by mouth every 6 (six) hours as needed for diarrhea or loose stools (stomach cramps). 03/18/18  Yes Derek Jack, MD  Flax Oil-Fish Oil-Borage Oil (FISH OIL-FLAX OIL-BORAGE OIL) CAPS Take 1 capsule by mouth daily.   Yes [provider]  Garlic 4481 MG CAPS Take 1 capsule by mouth daily.   Yes [provider]  glipiZIDE (GLIPIZIDE XL) 5 MG 24 hr tablet Take 1 tablet (5 mg total) by mouth daily with breakfast. 05/27/18  Yes Lockamy, Randi L, NP-C  glipiZIDE (GLUCOTROL XL) 10 MG 24 hr tablet Take 10 mg by mouth daily.  07/22/17  Yes [provider]  Glucosamine-Chondroit-Collagen ([employer] GLUCO-CHONDROIT PLUS UC-II PO) Take 1 tablet by mouth daily.    Yes [provider]  IRINOTECAN HCL IV Inject into the vein.   Yes [provider]  LECITHIN PO Take 1 capsule by  mouth daily.   Yes [provider]  LEUCOVORIN CALCIUM IV Inject into the vein.   Yes [provider]  magnesium oxide (MAG-OX) 400 MG tablet Take 400 mg by mouth daily.    Yes [provider]  metFORMIN (GLUCOPHAGE) 500 MG tablet Take 1 tablet (500 mg total) by mouth 2 (two) times daily with a meal. Patient taking differently: Take 500 mg by mouth daily with breakfast.  08/06/17  Yes Derek Jack, MD  milk thistle 175 MG tablet Take 175 mg by mouth daily.   Yes [provider]  OVER THE COUNTER MEDICATION Take 1 capsule by mouth daily.  Mushroom Complex 750 mg   Yes [provider]  OVER THE COUNTER MEDICATION Take 1 capsule by mouth daily. Cura Med 750 mg   Yes [provider]  oxyCODONE-acetaminophen (PERCOCET/ROXICET) 5-325 MG tablet Take 1 tablet by mouth every 6 (six) hours as needed. for pain 12/11/17  Yes [provider]  valsartan-hydrochlorothiazide (DIOVAN-HCT) 160-25 MG tablet Take 1 tablet by mouth daily. 05/05/17  Yes [provider]  Blood Glucose Monitoring Suppl (ONE TOUCH ULTRA MINI) W/DEVICE KIT USE TO CHECK BLOOD SUGAR DAILY. 01/05/15   [provider]  fluorouracil CALGB 92119 in sodium chloride 0.9 % 150 mL Inject into the vein over 96 hr.    [provider]  lidocaine-prilocaine (EMLA) cream Apply small amount over port site and cover with plastic wrap one hour prior to appointment. 02/27/18   Derek Jack, MD  prochlorperazine (COMPAZINE) 10 MG tablet Take 1 tablet (10 mg total) by mouth every 6 (six) hours as needed for nausea or vomiting. 02/27/18   Derek Jack, MD     Family History  Problem Relation Age of Onset  . Diabetes Mother   . Cancer Father   . Bone cancer Father   . Colon cancer Neg Hx   . Colon polyps Neg Hx     Social History   Socioeconomic History  . Marital status: Married    Spouse name: Not on file  . Number of children: Not on file  . Years of education: Not on file  . Highest education level: Not on file  Occupational History  . Not on file  Social Needs  . Financial resource strain: Not on file  . Food insecurity:    Worry: Not on file    Inability: Not on file  . Transportation needs:    Medical: Not on file    Non-medical: Not on file  Tobacco Use  . Smoking status: Former Smoker    Last attempt to quit: 04/05/1995    Years since quitting: 23.2  . Smokeless tobacco: Never Used  Substance and Sexual Activity  . Alcohol use: Not Currently    Alcohol/week: 0.0 standard drinks    Comment: used to  drink but stopped about 20 years ago  . Drug use: Never  . Sexual activity: Not on file  Lifestyle  . Physical activity:    Days per week: Not on file    Minutes per session: Not on file  . Stress: Not on file  Relationships  . Social connections:    Talks on phone: Not on file    Gets together: Not on file    Attends religious service: Not on file    Active member of club or organization: Not on file    Attends meetings of clubs or organizations: Not on file    Relationship status: Not on file  Other Topics Concern  . Not on file  Social History Narrative  . Not on file     Review of Systems: A 12 point ROS discussed and pertinent positives are indicated in the HPI above.  All other systems are negative.  Review of Systems  Vital Signs: BP (!) 129/94   Pulse 81   Temp 97.9 F (36.6 C) (Oral)   Resp 18   SpO2 98%   Physical Exam Vitals signs reviewed.  Constitutional:      Appearance: Normal appearance.  HENT:     Head: Normocephalic and atraumatic.  Eyes:     Extraocular Movements: Extraocular movements intact.  Neck:     Musculoskeletal: Normal range of motion.  Cardiovascular:     Rate and Rhythm: Normal rate and regular rhythm.  Pulmonary:     Effort: Pulmonary effort is normal. No respiratory distress.     Breath sounds: Normal breath sounds.  Abdominal:     General: There is no distension.     Palpations: Abdomen is soft.     Tenderness: There is no abdominal tenderness.  Musculoskeletal: Normal range of motion.  Skin:    General: Skin is warm and dry.  Neurological:     General: No focal deficit present.     Mental Status: He is alert and oriented to person, place, and time.  Psychiatric:        Mood and Affect: Mood normal.        Behavior: Behavior normal.        Thought Content: Thought content normal.        Judgment: Judgment normal.     Imaging: Ct Chest W Contrast  Result Date: 06/17/2018 CLINICAL DATA:  Stage IV colon cancer with  liver and lung metastasis. EXAM: CT CHEST, ABDOMEN, AND PELVIS WITH CONTRAST TECHNIQUE: Multidetector CT imaging of the chest, abdomen and pelvis was performed following the standard protocol during bolus administration of intravenous contrast. CONTRAST:  168m OMNIPAQUE IOHEXOL 300 MG/ML  SOLN COMPARISON:  02/05/2018.  Abdominal MRI of 02/25/2018. FINDINGS: CT CHEST FINDINGS Cardiovascular: Right Port-A-Cath tip at high right atrium. Aortic atherosclerosis. Tortuous thoracic aorta. Normal heart size, without pericardial effusion. Multivessel coronary artery atherosclerosis. No central pulmonary embolism, on this non-dedicated study. Pulmonary artery enlargement, outflow tract 3.1 cm. Mediastinum/Nodes: No supraclavicular adenopathy. No mediastinal or hilar adenopathy. Tiny hiatal hernia. Right cardiophrenic angle adenopathy is new at 1.3 cm on image 46/2. Lungs/Pleura: Minimal right pleural thickening. Significant progression of pulmonary nodules bilaterally. An index left upper lobe pulmonary nodule measures 1.9 cm on image 72/4 and is either new or markedly enlarged since the prior. An index right upper lobe pulmonary nodule of 1.8 cm on image 48/4 is at the site of a 2 mm nodule on the prior. Musculoskeletal: No acute osseous abnormality. Question remote trauma involving the right scapula. CT ABDOMEN PELVIS FINDINGS Hepatobiliary: Extensive bilateral hepatic metastasis. index segment 2 lesion measures 5.5 x 4.4 cm on image 48/2. Compare 2.6 x 2.4 cm on 02/25/2018 MRI, 1.3 cm on 02/05/2018 CT. An index dominant segment 4 mass measures 7.4 x 6.4 cm on 56/2 versus 6.9 x 5.6 cm on 02/25/2018 and 5.4 x 5.7 cm on 02/05/2018 (when remeasured). Normal gallbladder, without biliary ductal dilatation. Pancreas: Normal, without mass or ductal dilatation. Spleen: Normal in size, without focal abnormality. Adrenals/Urinary Tract: Normal left adrenal gland. Minimal right adrenal nodularity is unchanged. Too small to  characterize lower pole right renal lesion. Normal left kidney. No hydronephrosis. Normal  urinary bladder. Stomach/Bowel: Normal remainder of the stomach. Normal colon and terminal ileum. Normal small bowel caliber. Small bowel enters a right inguinal hernia on image 109/2, decreased. Vascular/Lymphatic: Aortic and branch vessel atherosclerosis. Developing adenopathy at the celiac including at 11 mm on image 61/2 versus 4 mm on the prior. Reproductive: Normal prostate. Other: No significant free fluid. Tiny fat containing left inguinal hernia. Development of extensive omental/peritoneal metastasis. An index right abdominal omental implant measures 2.2 x 3.3 cm on image 70/2. Musculoskeletal: Degenerate disc disease at L4-5 with disc bulges at multiple lumbosacral levels. IMPRESSION: 1. Marked progression of disease, including pulmonary and hepatic metastasis. New omental nodularity and thoracoabdominal adenopathy, consistent with metastatic disease. 2. Coronary artery atherosclerosis. Aortic Atherosclerosis (ICD10-I70.0). 3. Right inguinal hernia, containing decreased nonobstructive small bowel. 4. Pulmonary artery enlargement suggests pulmonary arterial hypertension. Electronically Signed   By: Abigail Miyamoto M.D.   On: 06/17/2018 08:42   Ct Abdomen Pelvis W Contrast  Result Date: 06/17/2018 CLINICAL DATA:  Stage IV colon cancer with liver and lung metastasis. EXAM: CT CHEST, ABDOMEN, AND PELVIS WITH CONTRAST TECHNIQUE: Multidetector CT imaging of the chest, abdomen and pelvis was performed following the standard protocol during bolus administration of intravenous contrast. CONTRAST:  15m OMNIPAQUE IOHEXOL 300 MG/ML  SOLN COMPARISON:  02/05/2018.  Abdominal MRI of 02/25/2018. FINDINGS: CT CHEST FINDINGS Cardiovascular: Right Port-A-Cath tip at high right atrium. Aortic atherosclerosis. Tortuous thoracic aorta. Normal heart size, without pericardial effusion. Multivessel coronary artery atherosclerosis. No  central pulmonary embolism, on this non-dedicated study. Pulmonary artery enlargement, outflow tract 3.1 cm. Mediastinum/Nodes: No supraclavicular adenopathy. No mediastinal or hilar adenopathy. Tiny hiatal hernia. Right cardiophrenic angle adenopathy is new at 1.3 cm on image 46/2. Lungs/Pleura: Minimal right pleural thickening. Significant progression of pulmonary nodules bilaterally. An index left upper lobe pulmonary nodule measures 1.9 cm on image 72/4 and is either new or markedly enlarged since the prior. An index right upper lobe pulmonary nodule of 1.8 cm on image 48/4 is at the site of a 2 mm nodule on the prior. Musculoskeletal: No acute osseous abnormality. Question remote trauma involving the right scapula. CT ABDOMEN PELVIS FINDINGS Hepatobiliary: Extensive bilateral hepatic metastasis. index segment 2 lesion measures 5.5 x 4.4 cm on image 48/2. Compare 2.6 x 2.4 cm on 02/25/2018 MRI, 1.3 cm on 02/05/2018 CT. An index dominant segment 4 mass measures 7.4 x 6.4 cm on 56/2 versus 6.9 x 5.6 cm on 02/25/2018 and 5.4 x 5.7 cm on 02/05/2018 (when remeasured). Normal gallbladder, without biliary ductal dilatation. Pancreas: Normal, without mass or ductal dilatation. Spleen: Normal in size, without focal abnormality. Adrenals/Urinary Tract: Normal left adrenal gland. Minimal right adrenal nodularity is unchanged. Too small to characterize lower pole right renal lesion. Normal left kidney. No hydronephrosis. Normal urinary bladder. Stomach/Bowel: Normal remainder of the stomach. Normal colon and terminal ileum. Normal small bowel caliber. Small bowel enters a right inguinal hernia on image 109/2, decreased. Vascular/Lymphatic: Aortic and branch vessel atherosclerosis. Developing adenopathy at the celiac including at 11 mm on image 61/2 versus 4 mm on the prior. Reproductive: Normal prostate. Other: No significant free fluid. Tiny fat containing left inguinal hernia. Development of extensive omental/peritoneal  metastasis. An index right abdominal omental implant measures 2.2 x 3.3 cm on image 70/2. Musculoskeletal: Degenerate disc disease at L4-5 with disc bulges at multiple lumbosacral levels. IMPRESSION: 1. Marked progression of disease, including pulmonary and hepatic metastasis. New omental nodularity and thoracoabdominal adenopathy, consistent with metastatic disease. 2. Coronary artery atherosclerosis. Aortic  Atherosclerosis (ICD10-I70.0). 3. Right inguinal hernia, containing decreased nonobstructive small bowel. 4. Pulmonary artery enlargement suggests pulmonary arterial hypertension. Electronically Signed   By: Abigail Miyamoto M.D.   On: 06/17/2018 08:42    Labs:  CBC: Recent Labs    05/04/18 1118 05/13/18 0841 05/27/18 0954 06/23/18 1145  WBC 7.7 10.1 6.2 10.5  HGB 11.9* 11.2* 11.3* 11.9*  HCT 37.2* 36.9* 37.0* 38.8*  PLT 227 273 245 205    COAGS: Recent Labs    12/04/17 1337 06/23/18 1145  INR 0.94 1.0    BMP: Recent Labs    04/13/18 0918 05/04/18 1118 05/13/18 0841 05/27/18 0954  NA 135 128* 133* 135  K 4.4 4.3 3.5 4.4  CL 96* 91* 95* 97*  CO2 '28 25 28 28  ' GLUCOSE 354* 482* 273* 367*  BUN '13 19 17 15  ' CALCIUM 9.5 9.0 9.2 9.6  CREATININE 1.04 1.11 1.18 0.98  GFRNONAA >60 >60 >60 >60  GFRAA >60 >60 >60 >60    LIVER FUNCTION TESTS: Recent Labs    04/13/18 0918 05/04/18 1118 05/13/18 0841 05/27/18 0954  BILITOT 0.6 0.5 0.5 0.8  AST '23 16 19 19  ' ALT '20 17 17 16  ' ALKPHOS 145* 140* 144* 133*  PROT 7.6 7.7 8.2* 7.8  ALBUMIN 3.8 3.5 3.7 3.9    TUMOR MARKERS: No results for input(s): AFPTM, CEA, CA199, CHROMGRNA in the last 8760 hours.  Assessment and Plan:  Metastatic colon cancer = apparent progressive disease despite treatment.  Will proceed with image guided biopsy of liver lesion today by Dr. Annamaria Boots.  Risks and benefits of liver biopsy was discussed with the patient and/or patient's family including, but not limited to bleeding, infection, damage to  adjacent structures or low yield requiring additional tests.  All of the questions were answered and there is agreement to proceed.  Consent signed and in chart.  Thank you for this interesting consult.  I greatly enjoyed meeting Christopher Friedman and look forward to participating in their care.  A copy of this report was sent to the requesting provider on this date.  Electronically Signed: Murrell Redden, PA-C   06/23/2018, 12:47 PM      I spent a total of    25 Minutes in face to face in clinical consultation, greater than 50% of which was counseling/coordinating care for liver biopsy.

## 2018-06-23 NOTE — Discharge Instructions (Signed)
Moderate Conscious Sedation, Adult, Care After  These instructions provide you with information about caring for yourself after your procedure. Your health care provider may also give you more specific instructions. Your treatment has been planned according to current medical practices, but problems sometimes occur. Call your health care provider if you have any problems or questions after your procedure.  What can I expect after the procedure?  After your procedure, it is common:   To feel sleepy for several hours.   To feel clumsy and have poor balance for several hours.   To have poor judgment for several hours.   To vomit if you eat too soon.  Follow these instructions at home:  For at least 24 hours after the procedure:     Do not:  ? Participate in activities where you could fall or become injured.  ? Drive.  ? Use heavy machinery.  ? Drink alcohol.  ? Take sleeping pills or medicines that cause drowsiness.  ? Make important decisions or sign legal documents.  ? Take care of children on your own.   Rest.  Eating and drinking   Follow the diet recommended by your health care provider.   If you vomit:  ? Drink water, juice, or soup when you can drink without vomiting.  ? Make sure you have little or no nausea before eating solid foods.  General instructions   Have a responsible adult stay with you until you are awake and alert.   Take over-the-counter and prescription medicines only as told by your health care provider.   If you smoke, do not smoke without supervision.   Keep all follow-up visits as told by your health care provider. This is important.  Contact a health care provider if:   You keep feeling nauseous or you keep vomiting.   You feel light-headed.   You develop a rash.   You have a fever.  Get help right away if:   You have trouble breathing.  This information is not intended to replace advice given to you by your health care provider. Make sure you discuss any questions you have  with your health care provider.  Document Released: 02/03/2013 Document Revised: 09/18/2015 Document Reviewed: 08/05/2015  Elsevier Interactive Patient Education  2019 [employer]  Liver Biopsy, Care After  These instructions give you information about how to care for yourself after your procedure. Your health care provider may also give you more specific instructions. If you have problems or questions, contact your health care provider.  What can I expect after the procedure?  After your procedure, it is common to have:   Pain and soreness in the area where the biopsy was done.   Bruising around the area where the biopsy was done.   Sleepiness and fatigue for 1-2 days.  Follow these instructions at home:  Medicines   Take over-the-counter and prescription medicines only as told by your health care provider.   If you were prescribed an antibiotic medicine, take it as told by your health care provider. Do not stop taking the antibiotic even if you start to feel better.   Do not take medicines such as aspirin and ibuprofen unless your health care provider tells you to take them. These medicines thin your blood and can increase the risk of bleeding.   If you are taking prescription pain medicine, take actions to prevent or treat constipation. Your health care provider may recommend that you:  ? Drink enough fluid to keep   your urine pale yellow.  ? Eat foods that are high in fiber, such as fresh fruits and vegetables, whole grains, and beans.  ? Limit foods that are high in fat and processed sugars, such as fried or sweet foods.  ? Take an over-the-counter or prescription medicine for constipation.  Incision care   Follow instructions from your health care provider about how to take care of your incision. Make sure you:  ? Wash your hands with soap and water before you change your bandage (dressing). If soap and water are not available, use hand sanitizer.  ? Change your dressing as told by your health care  provider.  ? Leave stitches (sutures), skin glue, or adhesive strips in place. These skin closures may need to stay in place for 2 weeks or longer. If adhesive strip edges start to loosen and curl up, you may trim the loose edges. Do not remove adhesive strips completely unless your health care provider tells you to do that.   Check your incision area every day for signs of infection. Check for:  ? Redness, swelling, or pain.  ? Fluid or blood.  ? Warmth.  ? Pus or a bad smell.   Do not take baths, swim, or use a hot tub until your health care provider says it is okay to do so.  Activity     Rest at home for 1-2 days, or as directed by your health care provider.  ? Avoid sitting for a long time without moving. Get up to take short walks every 1-2 hours. This is important to improve blood flow and breathing. Ask for help if you feel weak or unsteady.   Return to your normal activities as told by your health care provider. Ask your health care provider what activities are safe for you.   Do not drive or use heavy machinery while taking prescription pain medicine.   Do not lift anything that is heavier than 10 lb (4.5 kg), or the limit that your health care provider tells you, until he or she says that it is safe.   Do not play contact sports for 2 weeks after the procedure.  General instructions     Do not drink alcohol in the first week after the procedure.   Have someone stay with you for at least 24 hours after the procedure.   It is your responsibility to obtain your test results. Ask your health care provider, or the department that is doing the test:  ? When will my results be ready?  ? How will I get my results?  ? What are my treatment options?  ? What other tests do I need?  ? What are my next steps?   Keep all follow-up visits as told by your health care provider. This is important.  Contact a health care provider if:   You have increased bleeding from an incision, resulting in more than a small  spot of blood.   You have redness, swelling, or increasing pain in any incisions.   You notice a discharge or a bad smell coming from any of your incisions.   You have a fever or chills.  Get help right away if:   You develop swelling, bloating, or pain in your abdomen.   You become dizzy or faint.   You develop a rash.   You have nausea or you vomit.   You faint, or you have shortness of breath or difficulty breathing.   You develop chest   pain.   You have problems with your speech or vision.   You have trouble with your balance or moving your arms or legs.  Summary   After the liver biopsy, it is common to have pain, soreness, and bruising in the area, as well as sleepiness and fatigue.   Take over-the-counter and prescription medicines only as told by your health care provider.   Follow instructions from your health care provider about how to care for your incision. Check the incision area daily for signs of infection.  This information is not intended to replace advice given to you by your health care provider. Make sure you discuss any questions you have with your health care provider.  Document Released: 11/02/2004 Document Revised: 04/25/2017 Document Reviewed: 04/25/2017  Elsevier Interactive Patient Education  2019 [employer]

## 2018-06-24 ENCOUNTER — Encounter (HOSPITAL_COMMUNITY): Payer: Medicare PPO

## 2018-06-24 ENCOUNTER — Encounter (HOSPITAL_COMMUNITY): Payer: Self-pay | Admitting: *Deleted

## 2018-06-24 ENCOUNTER — Ambulatory Visit (HOSPITAL_COMMUNITY): Payer: Medicare PPO

## 2018-06-24 ENCOUNTER — Other Ambulatory Visit (HOSPITAL_COMMUNITY): Payer: Medicare PPO

## 2018-06-24 NOTE — Progress Notes (Signed)
I spoke with Suanne Marker in pathology and ordered foundation one on Access # [phone] [id number] . Stage IV C18.9, C78.7

## 2018-06-26 ENCOUNTER — Encounter (HOSPITAL_COMMUNITY): Payer: Medicare PPO

## 2018-06-26 DIAGNOSIS — E1165 Type 2 diabetes mellitus with hyperglycemia: Secondary | ICD-10-CM | POA: Diagnosis not present

## 2018-06-26 DIAGNOSIS — I10 Essential (primary) hypertension: Secondary | ICD-10-CM | POA: Diagnosis not present

## 2018-06-26 DIAGNOSIS — E782 Mixed hyperlipidemia: Secondary | ICD-10-CM | POA: Diagnosis not present

## 2018-07-01 DIAGNOSIS — C189 Malignant neoplasm of colon, unspecified: Secondary | ICD-10-CM | POA: Diagnosis not present

## 2018-07-07 ENCOUNTER — Other Ambulatory Visit (HOSPITAL_COMMUNITY): Payer: Medicare PPO

## 2018-07-07 ENCOUNTER — Ambulatory Visit (HOSPITAL_COMMUNITY): Payer: Medicare PPO | Admitting: Hematology

## 2018-07-07 DIAGNOSIS — E782 Mixed hyperlipidemia: Secondary | ICD-10-CM | POA: Diagnosis not present

## 2018-07-07 DIAGNOSIS — E8881 Metabolic syndrome: Secondary | ICD-10-CM | POA: Diagnosis not present

## 2018-07-07 DIAGNOSIS — C787 Secondary malignant neoplasm of liver and intrahepatic bile duct: Secondary | ICD-10-CM | POA: Diagnosis not present

## 2018-07-07 DIAGNOSIS — C187 Malignant neoplasm of sigmoid colon: Secondary | ICD-10-CM | POA: Diagnosis not present

## 2018-07-07 DIAGNOSIS — M542 Cervicalgia: Secondary | ICD-10-CM | POA: Diagnosis not present

## 2018-07-07 DIAGNOSIS — C189 Malignant neoplasm of colon, unspecified: Secondary | ICD-10-CM | POA: Diagnosis not present

## 2018-07-07 DIAGNOSIS — E1169 Type 2 diabetes mellitus with other specified complication: Secondary | ICD-10-CM | POA: Diagnosis not present

## 2018-07-07 DIAGNOSIS — Z6828 Body mass index (BMI) 28.0-28.9, adult: Secondary | ICD-10-CM | POA: Diagnosis not present

## 2018-07-08 ENCOUNTER — Ambulatory Visit (HOSPITAL_COMMUNITY): Payer: Medicare PPO

## 2018-07-08 ENCOUNTER — Other Ambulatory Visit: Payer: Self-pay

## 2018-07-08 ENCOUNTER — Encounter (HOSPITAL_COMMUNITY): Payer: Self-pay | Admitting: Hematology

## 2018-07-08 ENCOUNTER — Inpatient Hospital Stay (HOSPITAL_BASED_OUTPATIENT_CLINIC_OR_DEPARTMENT_OTHER): Payer: Medicare PPO | Admitting: Hematology

## 2018-07-08 ENCOUNTER — Inpatient Hospital Stay (HOSPITAL_COMMUNITY): Payer: Medicare PPO | Attending: Hematology

## 2018-07-08 DIAGNOSIS — C187 Malignant neoplasm of sigmoid colon: Secondary | ICD-10-CM | POA: Insufficient documentation

## 2018-07-08 DIAGNOSIS — C7800 Secondary malignant neoplasm of unspecified lung: Secondary | ICD-10-CM | POA: Insufficient documentation

## 2018-07-08 DIAGNOSIS — Z5111 Encounter for antineoplastic chemotherapy: Secondary | ICD-10-CM | POA: Insufficient documentation

## 2018-07-08 DIAGNOSIS — R609 Edema, unspecified: Secondary | ICD-10-CM

## 2018-07-08 DIAGNOSIS — Z87891 Personal history of nicotine dependence: Secondary | ICD-10-CM | POA: Diagnosis not present

## 2018-07-08 DIAGNOSIS — C189 Malignant neoplasm of colon, unspecified: Secondary | ICD-10-CM

## 2018-07-08 DIAGNOSIS — R0609 Other forms of dyspnea: Secondary | ICD-10-CM | POA: Insufficient documentation

## 2018-07-08 DIAGNOSIS — E1142 Type 2 diabetes mellitus with diabetic polyneuropathy: Secondary | ICD-10-CM

## 2018-07-08 DIAGNOSIS — I10 Essential (primary) hypertension: Secondary | ICD-10-CM | POA: Diagnosis not present

## 2018-07-08 DIAGNOSIS — C787 Secondary malignant neoplasm of liver and intrahepatic bile duct: Secondary | ICD-10-CM

## 2018-07-08 DIAGNOSIS — Z452 Encounter for adjustment and management of vascular access device: Secondary | ICD-10-CM | POA: Insufficient documentation

## 2018-07-08 DIAGNOSIS — M542 Cervicalgia: Secondary | ICD-10-CM

## 2018-07-08 LAB — CBC WITH DIFFERENTIAL/PLATELET
Abs Immature Granulocytes: 0.05 10*3/uL (ref 0.00–0.07)
Basophils Absolute: 0.1 10*3/uL (ref 0.0–0.1)
Basophils Relative: 1 %
Eosinophils Absolute: 0.8 10*3/uL — ABNORMAL HIGH (ref 0.0–0.5)
Eosinophils Relative: 10 %
HCT: 38.5 % — ABNORMAL LOW (ref 39.0–52.0)
Hemoglobin: 12 g/dL — ABNORMAL LOW (ref 13.0–17.0)
IMMATURE GRANULOCYTES: 1 %
Lymphocytes Relative: 19 %
Lymphs Abs: 1.7 10*3/uL (ref 0.7–4.0)
MCH: 27.8 pg (ref 26.0–34.0)
MCHC: 31.2 g/dL (ref 30.0–36.0)
MCV: 89.3 fL (ref 80.0–100.0)
MONOS PCT: 6 %
Monocytes Absolute: 0.5 10*3/uL (ref 0.1–1.0)
NEUTROS PCT: 63 %
Neutro Abs: 5.6 10*3/uL (ref 1.7–7.7)
PLATELETS: 260 10*3/uL (ref 150–400)
RBC: 4.31 MIL/uL (ref 4.22–5.81)
RDW: 16 % — ABNORMAL HIGH (ref 11.5–15.5)
WBC: 8.8 10*3/uL (ref 4.0–10.5)
nRBC: 0 % (ref 0.0–0.2)

## 2018-07-08 LAB — COMPREHENSIVE METABOLIC PANEL
ALK PHOS: 164 U/L — AB (ref 38–126)
ALT: 20 U/L (ref 0–44)
AST: 28 U/L (ref 15–41)
Albumin: 4.1 g/dL (ref 3.5–5.0)
Anion gap: 12 (ref 5–15)
BUN: 14 mg/dL (ref 8–23)
CALCIUM: 10 mg/dL (ref 8.9–10.3)
CO2: 26 mmol/L (ref 22–32)
Chloride: 99 mmol/L (ref 98–111)
Creatinine, Ser: 0.94 mg/dL (ref 0.61–1.24)
GFR calc Af Amer: >60 mL/min (ref >60)
GFR calc non Af Amer: >60 mL/min (ref >60)
Glucose, Bld: 173 mg/dL — ABNORMAL HIGH (ref 70–99)
Potassium: 4.6 mmol/L (ref 3.5–5.1)
Sodium: 137 mmol/L (ref 135–145)
TOTAL PROTEIN: 8 g/dL (ref 6.5–8.1)
Total Bilirubin: 0.4 mg/dL (ref 0.3–1.2)

## 2018-07-08 LAB — MAGNESIUM: Magnesium: 1.8 mg/dL (ref 1.7–2.4)

## 2018-07-08 NOTE — Patient Instructions (Addendum)
[place] at [facility] Discharge Instructions  You were seen today by Dr. Delton Coombes. He went over your recent lab results and discussed different treatment options. He will see in the clinic early next week for labs and follow up.   Thank you for choosing [place] at [facility] to provide your oncology and hematology care.  To afford each patient quality time with our provider, please arrive at least 15 minutes before your scheduled appointment time.   If you have a lab appointment with the [facility] please come in thru the  Main Entrance and check in at the main information desk  You need to re-schedule your appointment should you arrive 10 or more minutes late.  We strive to give you quality time with our providers, and arriving late affects you and other patients whose appointments are after yours.  Also, if you no show three or more times for appointments you may be dismissed from the clinic at the providers discretion.     Again, thank you for choosing [facility].  Our hope is that these requests will decrease the amount of time that you wait before being seen by our physicians.       _____________________________________________________________  Should you have questions after your visit to [facility], please contact our office at [phone] between the hours of 8:00 a.m. and 4:30 p.m.  Voicemails left after 4:00 p.m. will not be returned until the following business day.  For prescription refill requests, have your pharmacy contact our office and allow 72 hours.    Cancer Center Support Programs:   > Cancer Support Group  2nd Tuesday of the month 1pm-2pm, Journey Room

## 2018-07-08 NOTE — Progress Notes (Signed)
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 ([place])   05/28/2017 - 06/09/2018 Chemotherapy    The patient had palonosetron (ALOXI) injection 0.25 mg, 0.25 mg, Intravenous,  Once, 18 of 22 cycles Administration: 0.25 mg (09/03/2017), 0.25 mg (10/01/2017), 0.25 mg (10/15/2017), 0.25 mg (10/29/2017), 0.25 mg (09/17/2017), 0.25 mg (03/02/2018), 0.25 mg (03/16/2018), 0.25 mg (03/30/2018), 0.25 mg (04/13/2018), 0.25 mg (05/13/2018), 0.25 mg (05/27/2018) irinotecan (CAMPTOSAR) 400 mg in dextrose 5 % 500 mL chemo infusion, 180 mg/m2, Intravenous,  Once, 18 of 22 cycles Dose modification: 144 mg/m2 (Cycle 4, Reason: Provider Judgment) Administration: 320 mg (09/03/2017), 320 mg (10/01/2017), 320 mg (10/15/2017), 320 mg (10/29/2017), 320 mg (09/17/2017), 320 mg (03/02/2018), 320 mg (03/16/2018), 320 mg (03/30/2018), 320 mg (04/13/2018), 320 mg (05/13/2018), 320 mg (05/27/2018) leucovorin 888 mg in dextrose 5 % 250 mL infusion, 400 mg/m2, Intravenous,  Once, 18 of 22 cycles Administration: 900 mg (09/03/2017), 900 mg (10/01/2017), 900 mg (10/15/2017), 900 mg (10/29/2017), 900 mg (09/17/2017), 900 mg (03/02/2018), 900 mg (03/16/2018), 900 mg (03/30/2018), 900 mg (04/13/2018), 900 mg (05/13/2018), 900 mg (05/27/2018) fluorouracil (ADRUCIL) chemo injection 900 mg, 400 mg/m2, Intravenous,  Once, 18 of 22 cycles Dose modification: 320 mg/m2 (Cycle 4, Reason: Provider Judgment) Administration: 700 mg (09/03/2017), 700 mg (10/01/2017), 700 mg (10/15/2017), 700 mg (10/29/2017), 700 mg (09/17/2017), 700 mg (03/02/2018), 700 mg (03/16/2018), 700 mg (03/30/2018), 700 mg (04/13/2018), 700 mg (05/13/2018), 700 mg (05/27/2018) fluorouracil  (ADRUCIL) 5,350 mg in sodium chloride 0.9 % 143 mL chemo infusion, 2,400 mg/m2, Intravenous, 1 Day/Dose, 18 of 22 cycles Dose modification: 1,920 mg/m2 (Cycle 4, Reason: Provider Judgment) Administration: 4,250 mg (09/03/2017), 4,250 mg (10/01/2017), 4,250 mg (10/15/2017), 4,250 mg (10/29/2017), 4,250 mg (09/17/2017), 4,250 mg (03/02/2018), 4,250 mg (03/16/2018), 4,250 mg (03/30/2018), 4,250 mg (04/13/2018), 4,250 mg (05/13/2018), 4,250 mg (05/27/2018)  for chemotherapy treatment.     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[place])    06/24/2018 -  Chemotherapy    The patient had palonosetron (ALOXI) injection 0.25 mg, 0.25 mg, Intravenous,  Once, 0 of 4 cycles leucovorin 864 mg in dextrose 5 % 250 mL infusion, 400 mg/m2 = 864 mg, Intravenous,  Once, 0 of 4 cycles oxaliplatin (ELOXATIN) 145 mg in dextrose 5 % 500 mL chemo infusion, 68 mg/m2 = 145 mg (80 % of original dose 85 mg/m2), Intravenous,  Once, 0 of 4 cycles Dose modification: 68 mg/m2 (80 % of original dose 85 mg/m2, Cycle 1, Reason: Other (see comments), Comment: Pre-existing neuropathy) fluorouracil (ADRUCIL) chemo injection 700 mg, 320 mg/m2 = 700 mg (80 % of original dose 400 mg/m2), Intravenous,  Once, 0 of 4 cycles Dose modification: 320 mg/m2 (80 % of original dose 400 mg/m2, Cycle 1, Reason: Provider Judgment) fluorouracil (ADRUCIL) 4,150 mg in sodium chloride 0.9 % 67 mL chemo infusion, 1,920 mg/m2 = 4,150 mg (80 % of original dose 2,400 mg/m2), Intravenous, 1 Day/Dose, 0 of 4 cycles Dose modification: 1,920 mg/m2 (80 % of original dose 2,400 mg/m2, Cycle 1, Reason: Provider Judgment)  for chemotherapy treatment.       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 male returns  for routine follow-up and consideration for next cycle of chemotherapy. He is here today with his wife, daughter, and son. He states that he is having new pain in his neck. He states that he experiences shortness of breath with exertion at times. Denies any nausea, vomiting, or diarrhea. Had not noticed any recent bleeding such as epistaxis, hematuria or hematochezia. Denies recent chest pain on exertion, pre-syncopal episodes, or palpitations. Denies any numbness or tingling in hands or feet. Denies any recent fevers, infections, or recent hospitalizations. Patient reports appetite at 100% and energy level at 50%.         REVIEW OF SYSTEMS:  Review of Systems  Respiratory: Positive for shortness of breath.   Neurological: Positive for numbness.     PAST MEDICAL/SURGICAL HISTORY:  Past Medical History:  Diagnosis Date  . Colon cancer ([place])   . Diabetes ([place])   . GERD (gastroesophageal reflux disease)   . Hypertension    Past Surgical History:  Procedure Laterality Date  . APPENDECTOMY     during colon surgery  . COLON SURGERY    . IR RADIOLOGIST EVAL & MGMT  11/26/2017  . RADIOFREQUENCY ABLATION N/A 12/10/2017   Procedure: CT MICROWAVE THERMAL ABLATION (LIVER);  Surgeon: Markus Daft, MD;  Location: [facility] ORS;  Service: Anesthesiology;  Laterality: N/A;  . SHOULDER SURGERY  2003     SOCIAL HISTORY:  Social History   Socioeconomic History  . Marital status: Married    Spouse name: Not on file  . Number of children: Not on file  . Years of education: Not on file  . Highest education level: Not on file  Occupational History  . Not on file  Social Needs  . Financial resource strain: Not on file  . Food insecurity:    Worry: Not on file    Inability: Not on file  . Transportation needs:    Medical: Not on file    Non-medical: Not on file  Tobacco Use  . Smoking status: Former Smoker    Last attempt to quit: 04/05/1995    Years since quitting: 23.2  . Smokeless tobacco:  Never Used  Substance and Sexual Activity  . Alcohol use: Not Currently    Alcohol/week: 0.0 standard drinks    Comment: used to drink but stopped about 20 years ago  . Drug use: Never  . Sexual activity: Not on file  Lifestyle  . Physical activity:    Days per week: Not on file    Minutes per session: Not on file  . Stress: Not on file  Relationships  . Social connections:    Talks on phone: Not on file    Gets together: Not on file    Attends religious service: Not on file    Active member of club or organization: Not on file    Attends meetings of clubs or organizations: Not on file    Relationship status: Not on file  . Intimate partner violence:    Fear of current or ex partner: Not on file    Emotionally abused: Not on file    Physically abused: Not on file    Forced sexual activity: Not on file  Other Topics Concern  . Not on file  Social History Narrative  . Not on file  FAMILY HISTORY:  Family History  Problem Relation Age of Onset  . Diabetes Mother   . Cancer Father   . Bone cancer Father   . Colon cancer Neg Hx   . Colon polyps Neg Hx     CURRENT MEDICATIONS:  Outpatient Encounter Medications as of 07/08/2018  Medication Sig  . Alpha-Lipoic Acid 200 MG CAPS Take 1 capsule by mouth daily.  . Ascorbic Acid (VITAMIN C) 1000 MG tablet Take 2,000 mg by mouth daily.   . ASHWAGANDHA PO Take 800 mg by mouth daily.  Marland Kitchen aspirin 81 MG tablet Take 81 mg by mouth daily.  Marland Kitchen atorvastatin (LIPITOR) 10 MG tablet Take 10 mg by mouth daily.   Marland Kitchen BEE POLLEN PO Take 1 capsule by mouth daily.   . Blood Glucose Monitoring Suppl (ONE TOUCH ULTRA MINI) W/DEVICE KIT USE TO CHECK BLOOD SUGAR DAILY.  . cholecalciferol (VITAMIN D) 1000 units tablet Take 5,000 Units by mouth daily.   Marland Kitchen CINNAMON PO Take 2,000 mg by mouth daily.   Marland Kitchen Cod Liver Oil CAPS Take 1 capsule by mouth daily.  . Coenzyme Q10 (CO Q 10) 100 MG CAPS Take 1 capsule by mouth daily.   . Cyanocobalamin (VITAMIN B-12)  5000 MCG TBDP Take 5,000 mcg by mouth daily.  . Flax Oil-Fish Oil-Borage Oil (FISH OIL-FLAX OIL-BORAGE OIL) CAPS Take 1 capsule by mouth daily.  . fluorouracil CALGB 02542 in sodium chloride 0.9 % 150 mL Inject into the vein over 96 hr.  . Garlic 7062 MG CAPS Take 2 capsules by mouth daily.   Marland Kitchen glipiZIDE (GLIPIZIDE XL) 5 MG 24 hr tablet Take 1 tablet (5 mg total) by mouth daily with breakfast.  . glipiZIDE (GLUCOTROL XL) 10 MG 24 hr tablet Take 10 mg by mouth daily.   . Glucosamine-Chondroit-Collagen ([employer] GLUCO-CHONDROIT PLUS UC-II PO) Take 1 tablet by mouth daily.   . IRINOTECAN HCL IV Inject into the vein every 14 (fourteen) days.   Marland Kitchen LECITHIN PO Take 1 capsule by mouth daily. 1200 mg  . LEUCOVORIN CALCIUM IV Inject into the vein every 14 (fourteen) days.   Marland Kitchen lidocaine-prilocaine (EMLA) cream Apply small amount over port site and cover with plastic wrap one hour prior to appointment.  . metFORMIN (GLUCOPHAGE) 500 MG tablet Take 1 tablet (500 mg total) by mouth 2 (two) times daily with a meal. (Patient taking differently: Take 1,000 mg by mouth 2 (two) times daily with a meal. )  . milk thistle 175 MG tablet Take 175 mg by mouth daily.  Marland Kitchen OVER THE COUNTER MEDICATION Take 1 capsule by mouth daily. Mushroom Complex 750 mg  . OVER THE COUNTER MEDICATION Take 1 capsule by mouth daily. Cura Med 750 mg  . oxyCODONE-acetaminophen (PERCOCET/ROXICET) 5-325 MG tablet Take 1 tablet by mouth every 6 (six) hours as needed. for pain  . valsartan-hydrochlorothiazide (DIOVAN-HCT) 160-25 MG tablet Take 1 tablet by mouth daily.  . diphenoxylate-atropine (LOMOTIL) 2.5-0.025 MG tablet Take 1 tablet by mouth every 6 (six) hours as needed for diarrhea or loose stools (stomach cramps). (Patient not taking: Reported on 07/08/2018)  . magnesium oxide (MAG-OX) 400 MG tablet Take 400 mg by mouth daily.   . prochlorperazine (COMPAZINE) 10 MG tablet Take 1 tablet (10 mg total) by mouth every 6 (six) hours as needed for nausea  or vomiting. (Patient not taking: Reported on 07/08/2018)   Facility-Administered Encounter Medications as of 07/08/2018  Medication  . sodium chloride flush (NS) 0.9 % injection 10 mL  ALLERGIES:  No Known Allergies   PHYSICAL EXAM:  ECOG Performance status: 1  Vitals:   07/08/18 0933  BP: 139/87  Pulse: 99  Resp: 20  Temp: 98.2 F (36.8 C)  SpO2: 100%   Filed Weights   07/08/18 0933  Weight: 207 lb 8 oz (94.1 kg)    Physical Exam Constitutional:      Appearance: Normal appearance.  Cardiovascular:     Rate and Rhythm: Normal rate and regular rhythm.  Pulmonary:     Effort: Pulmonary effort is normal.     Breath sounds: Normal breath sounds.  Abdominal:     General: Abdomen is flat.     Palpations: Abdomen is soft.     Comments: Mild tenderness in the right upper and lower quadrants.  No palpable masses.  Musculoskeletal:        General: No swelling.  Skin:    General: Skin is warm.  Neurological:     General: No focal deficit present.     Mental Status: He is alert and oriented to person, place, and time.  Psychiatric:        Mood and Affect: Mood normal.        Behavior: Behavior normal.      LABORATORY DATA:  I have reviewed the labs as listed.  CBC    Component Value Date/Time   WBC 8.8 07/08/2018 0929   RBC 4.31 07/08/2018 0929   HGB 12.0 (L) 07/08/2018 0929   HCT 38.5 (L) 07/08/2018 0929   PLT 260 07/08/2018 0929   MCV 89.3 07/08/2018 0929   MCH 27.8 07/08/2018 0929   MCHC 31.2 07/08/2018 0929   RDW 16.0 (H) 07/08/2018 0929   LYMPHSABS 1.7 07/08/2018 0929   MONOABS 0.5 07/08/2018 0929   EOSABS 0.8 (H) 07/08/2018 0929   BASOSABS 0.1 07/08/2018 0929   CMP Latest Ref Rng & Units 07/08/2018 05/27/2018 05/13/2018  Glucose 70 - 99 mg/dL 173(H) 367(H) 273(H)  BUN 8 - 23 mg/dL '14 15 17  ' Creatinine 0.61 - 1.24 mg/dL 0.94 0.98 1.18  Sodium 135 - 145 mmol/L 137 135 133(L)  Potassium 3.5 - 5.1 mmol/L 4.6 4.4 3.5  Chloride 98 - 111 mmol/L 99 97(L)  95(L)  CO2 22 - 32 mmol/L '26 28 28  ' Calcium 8.9 - 10.3 mg/dL 10.0 9.6 9.2  Total Protein 6.5 - 8.1 g/dL 8.0 7.8 8.2(H)  Total Bilirubin 0.3 - 1.2 mg/dL 0.4 0.8 0.5  Alkaline Phos 38 - 126 U/L 164(H) 133(H) 144(H)  AST 15 - 41 U/L '28 19 19  ' ALT 0 - 44 U/L '20 16 17       ' DIAGNOSTIC IMAGING:  I have independently reviewed the scans and discussed with the patient.   I have reviewed Francene Finders, NP's note and agree with the documentation.  I personally performed a face-to-face visit, made revisions and my assessment and plan is as follows.    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 - He has completed 12 cycles of FOLFIRI on 10/29/2017.  He tolerated it very well.  CT CAP showed stable left upper lobe subcentimeter lung nodule, segment 4 lesion in the liver has decreased to 2.4 cm.  -He underwent  left hepatic lobe microwave ablation on 12/10/2017.  He did very well with minimal pain and complications.  He is able to do all his day-to-day activities without any problems. -CT scan of the abdomen pelvis on 02/05/2018 showed left hepatic lesion with central high attenuation and a large area of low-attenuation.  There is a new left hepatic lobe lesion measuring 1.3 cm.  CT chest showed similar appearing small nodules. - MRI of the liver dated 02/25/2018 shows heterogeneous mass which surrounds the right side of the ablation defect and extends inferiorly in segment 4B measuring 6.9 x 5.6 cm, segment 2 lesion measuring 2.6 cm, segment 8 subcapsular 1.6 cm lesion.  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 - He has at least 3 new lesions and  questionable capsular metastasis.  I do not believe he is a candidate for local therapy.  I have recommended going back on chemotherapy.  His last chemo was in July 2019, dose reduced FOLFIRI. - 6 cycles of FOLFIRI from 03/02/2018 through 05/27/2018. - CT CAP on 06/16/2018 shows progression of pulmonary nodules bilaterally, largest left upper lobe pulmonary nodule measuring 1.9 cm and right upper lobe pulmonary nodule 1.8 cm.  Right cardiophrenic angle adenopathy is new at 1.3 cm.  2 liver lesions, largest measuring 7.4 x 6.4 cm, a second largest measuring 5.5 x 4.4 cm.  Both have increased in size.  Development of extensive omental/peritoneal metastasis.  An index right abdominal omental implant measures 2.2 x 3.3 cm.  No bone metastasis. - I had a prolonged discussion with the patient and his family including his son and daughter.  I have recommended FOLFOX chemotherapy.  He had previous history of bleeding with Avastin requiring hospitalization.  He is not a candidate for EGFR antibody. - We discussed the side effects of this regimen in detail.  He will be started on FOLFOX therapy next week.  We will also make a referral to GI clinic Leslie Andrea) at [facility] to see if there are any clinical trials available upon progression on FOLFOX. - He has reportedly had on and off right-sided abdominal pain.  His wife is giving him some herbal supplements.  Apparently it helped with the pain. -I will see him back in 2 weeks for follow-up after the first cycle.  2.  Diabetes: -he is taking metformin 500 mg daily.  If he increases it, he gets diarrhea. - He is also on Glucotrol XL 10 mg daily with breakfast.  His sugars have been in the range of 300-400.  I will increase it to 15 mg daily.  We have sent a prescription for 5 mg tablet to be taken along with the 10 mg tablet.  3.  Peripheral neuropathy: -This is likely from diabetes.  He has mild numbness in the feet predominantly at bedtime.  He never received  oxaliplatin based chemotherapy.  4.  Hypertension: This is well controlled on valsartan HCTZ 160-25 mg daily.   Total time spent is 40 minutes with more than 50% of the time spent face-to-face discussing scan results, treatment options, side effects and coordination of care.  Orders placed this encounter:  No orders of the defined types were placed in this encounter.     Derek Jack, MD [place] [phone]

## 2018-07-08 NOTE — Progress Notes (Signed)
Referred patient to Dr. Leslie Andrea @ [facility].  Spoke w/Ann [phone].  Patient scheduled to see Dr. Leamon Arnt 07/28/18 @ 8 AM.  New patient packet will be Fed Ex'd to patient.  Records faxed to Attn : Ann [phone].

## 2018-07-08 NOTE — Assessment & Plan Note (Signed)
1.  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 - He has completed 12 cycles of FOLFIRI on 10/29/2017.  He tolerated it very well.  CT CAP showed stable left upper lobe subcentimeter lung nodule, segment 4 lesion in the liver has decreased to 2.4 cm.  -He underwent left hepatic lobe microwave ablation on 12/10/2017.  He did very well with minimal pain and complications.  He is able to do all his day-to-day activities without any problems. -CT scan of the abdomen pelvis on 02/05/2018 showed left hepatic lesion with central high attenuation and a large area of low-attenuation.  There is a new left hepatic lobe lesion measuring 1.3 cm.  CT chest showed similar appearing small nodules. - MRI of the liver dated 02/25/2018 shows heterogeneous mass which surrounds the right side of the ablation defect and extends inferiorly in segment 4B measuring 6.9 x 5.6 cm, segment 2 lesion measuring 2.6 cm, segment 8 subcapsular 1.6 cm lesion.  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 - He has at least 3 new lesions and questionable capsular metastasis.  I do not believe he is a candidate for local therapy.  I have recommended going back on chemotherapy.  His last chemo was in July 2019, dose reduced FOLFIRI. - 6 cycles of FOLFIRI from 03/02/2018 through 05/27/2018. - CT CAP on 06/16/2018 shows progression of pulmonary nodules bilaterally, largest left upper lobe pulmonary nodule measuring 1.9 cm and right upper lobe pulmonary nodule 1.8 cm.  Right cardiophrenic angle adenopathy is new at 1.3 cm.  2 liver lesions, largest measuring 7.4 x 6.4 cm, a second largest measuring 5.5 x 4.4 cm.  Both have increased in size.  Development of extensive omental/peritoneal  metastasis.  An index right abdominal omental implant measures 2.2 x 3.3 cm.  No bone metastasis. - I had a prolonged discussion with the patient and his family including his son and daughter.  I have recommended FOLFOX chemotherapy.  He had previous history of bleeding with Avastin requiring hospitalization.  He is not a candidate for EGFR antibody. - We discussed the side effects of this regimen in detail.  He will be started on FOLFOX therapy next week.  We will also make a referral to GI clinic Leslie Andrea) at [facility] to see if there are any clinical trials available upon progression on FOLFOX. - He has reportedly had on and off right-sided abdominal pain.  His wife is giving him some herbal supplements.  Apparently it helped with the pain. -I will see him back in 2 weeks for follow-up after the first cycle.  2.  Diabetes: -he is taking metformin 500 mg daily.  If he increases it, he gets diarrhea. - He is also on Glucotrol XL 10 mg daily with breakfast.  His sugars have been in the range of 300-400.  I will increase it to 15 mg daily.  We have sent a prescription for 5 mg tablet to be taken along with the 10 mg tablet.  3.  Peripheral neuropathy: -This is likely from diabetes.  He has mild numbness in the feet predominantly at bedtime.  He never received oxaliplatin based chemotherapy.  4.  Hypertension: This is well controlled on valsartan HCTZ 160-25 mg daily.

## 2018-07-09 LAB — CEA: CEA: 157 ng/mL — ABNORMAL HIGH (ref 0.0–4.7)

## 2018-07-10 ENCOUNTER — Encounter (HOSPITAL_COMMUNITY): Payer: Medicare PPO

## 2018-07-13 ENCOUNTER — Inpatient Hospital Stay (HOSPITAL_COMMUNITY): Payer: Medicare PPO

## 2018-07-13 ENCOUNTER — Other Ambulatory Visit: Payer: Self-pay

## 2018-07-13 ENCOUNTER — Other Ambulatory Visit (HOSPITAL_COMMUNITY): Payer: Medicare PPO

## 2018-07-13 VITALS — BP 110/73 | HR 89 | Temp 98.5°F | Resp 18 | Wt 204.2 lb

## 2018-07-13 DIAGNOSIS — C7800 Secondary malignant neoplasm of unspecified lung: Secondary | ICD-10-CM | POA: Diagnosis not present

## 2018-07-13 DIAGNOSIS — R0609 Other forms of dyspnea: Secondary | ICD-10-CM | POA: Diagnosis not present

## 2018-07-13 DIAGNOSIS — C187 Malignant neoplasm of sigmoid colon: Secondary | ICD-10-CM | POA: Diagnosis not present

## 2018-07-13 DIAGNOSIS — C189 Malignant neoplasm of colon, unspecified: Secondary | ICD-10-CM

## 2018-07-13 DIAGNOSIS — Z452 Encounter for adjustment and management of vascular access device: Secondary | ICD-10-CM | POA: Diagnosis not present

## 2018-07-13 DIAGNOSIS — I10 Essential (primary) hypertension: Secondary | ICD-10-CM | POA: Diagnosis not present

## 2018-07-13 DIAGNOSIS — E1142 Type 2 diabetes mellitus with diabetic polyneuropathy: Secondary | ICD-10-CM | POA: Diagnosis not present

## 2018-07-13 DIAGNOSIS — C787 Secondary malignant neoplasm of liver and intrahepatic bile duct: Secondary | ICD-10-CM | POA: Diagnosis not present

## 2018-07-13 DIAGNOSIS — M542 Cervicalgia: Secondary | ICD-10-CM | POA: Diagnosis not present

## 2018-07-13 DIAGNOSIS — Z5111 Encounter for antineoplastic chemotherapy: Secondary | ICD-10-CM | POA: Diagnosis not present

## 2018-07-13 DIAGNOSIS — C801 Malignant (primary) neoplasm, unspecified: Secondary | ICD-10-CM | POA: Diagnosis not present

## 2018-07-13 MED ORDER — SODIUM CHLORIDE 0.9% FLUSH
10.0000 mL | INTRAVENOUS | Status: DC | PRN
  Administered 2018-07-13: 10 mL
  Filled 2018-07-13: qty 10

## 2018-07-13 MED ORDER — LEUCOVORIN CALCIUM INJECTION 350 MG
400.0000 mg/m2 | Freq: Once | INTRAVENOUS | Status: AC
  Administered 2018-07-13: 864 mg via INTRAVENOUS
  Filled 2018-07-13: qty 25

## 2018-07-13 MED ORDER — SODIUM CHLORIDE 0.9 % IV SOLN
10.0000 mg | Freq: Once | INTRAVENOUS | Status: AC
  Administered 2018-07-13: 10 mg via INTRAVENOUS
  Filled 2018-07-13: qty 1

## 2018-07-13 MED ORDER — DEXTROSE 5 % IV SOLN
Freq: Once | INTRAVENOUS | Status: AC
  Administered 2018-07-13: 10:00:00 via INTRAVENOUS

## 2018-07-13 MED ORDER — OXYCODONE HCL 10 MG PO TABS: 10.0000 mg | ORAL_TABLET | Freq: Two times a day (BID) | ORAL | 0 refills | Status: AC | PRN

## 2018-07-13 MED ORDER — OXYCODONE HCL 5 MG PO TABS
10.0000 mg | ORAL_TABLET | Freq: Once | ORAL | Status: AC
  Administered 2018-07-13: 10 mg via ORAL
  Filled 2018-07-13: qty 2

## 2018-07-13 MED ORDER — SODIUM CHLORIDE 0.9 % IV SOLN
1920.0000 mg/m2 | INTRAVENOUS | Status: DC
  Administered 2018-07-13: 4150 mg via INTRAVENOUS
  Filled 2018-07-13: qty 83

## 2018-07-13 MED ORDER — PALONOSETRON HCL INJECTION 0.25 MG/5ML
0.2500 mg | Freq: Once | INTRAVENOUS | Status: AC
  Administered 2018-07-13: 0.25 mg via INTRAVENOUS
  Filled 2018-07-13: qty 5

## 2018-07-13 MED ORDER — FLUOROURACIL CHEMO INJECTION 2.5 GM/50ML
320.0000 mg/m2 | Freq: Once | INTRAVENOUS | Status: AC
  Administered 2018-07-13: 700 mg via INTRAVENOUS
  Filled 2018-07-13: qty 14

## 2018-07-13 MED ORDER — OXALIPLATIN CHEMO INJECTION 100 MG/20ML
68.0000 mg/m2 | Freq: Once | INTRAVENOUS | Status: AC
  Administered 2018-07-13: 145 mg via INTRAVENOUS
  Filled 2018-07-13: qty 20

## 2018-07-13 NOTE — Patient Instructions (Signed)
[facility] Discharge Instructions for Patients Receiving Chemotherapy   Beginning January 23rd 2017 lab work for the [facility] will be done in the  Main lab at [facility] on 1st floor. If you have a lab appointment with the [facility] please come in thru the  Main Entrance and check in at the main information desk   Today you received the following chemotherapy agents 5FU, oxaliplatin, and leucovorin  To help prevent nausea and vomiting after your treatment, we encourage you to take your nausea medication   If you develop nausea and vomiting, or diarrhea that is not controlled by your medication, call the clinic.  The clinic phone number is [phone]. Office hours are Monday-Friday 8:30am-5:00pm.  BELOW ARE SYMPTOMS THAT SHOULD BE REPORTED IMMEDIATELY:  *FEVER GREATER THAN 101.0 F  *CHILLS WITH OR WITHOUT FEVER  NAUSEA AND VOMITING THAT IS NOT CONTROLLED WITH YOUR NAUSEA MEDICATION  *UNUSUAL SHORTNESS OF BREATH  *UNUSUAL BRUISING OR BLEEDING  TENDERNESS IN MOUTH AND THROAT WITH OR WITHOUT PRESENCE OF ULCERS  *URINARY PROBLEMS  *BOWEL PROBLEMS  UNUSUAL RASH Items with * indicate a potential emergency and should be followed up as soon as possible. If you have an emergency after office hours please contact your primary care physician or go to the nearest emergency department.  Please call the clinic during office hours if you have any questions or concerns.   You may also contact the Patient Navigator at [phone] should you have any questions or need assistance in obtaining follow up care.      Resources For Cancer Patients and their Caregivers ? American Cancer Society: Can assist with transportation, wigs, general needs, runs Look Good Feel Better.        272 725 5836 ? Cancer Care: Provides financial assistance, online support groups, medication/co-pay assistance.  1-800-813-HOPE 910-155-2694) ? [facility] Assists [place] cancer patients and their families through emotional , educational and financial support.  (440)198-6407 ? [place] DSS Where to apply for food stamps, Medicaid and utility assistance. 601-393-5464 ? [facility]: Transportation to medical appointments. 217-621-7715 ? Social Security Administration: May apply for disability if have a Stage IV cancer. (385) 827-4657 260-796-2824 ? [employer], Disability and Transit Services: Assists with nutrition, care and transit needs. 947-298-9707

## 2018-07-13 NOTE — Progress Notes (Signed)
0930 lab work from 3/11 reviewed with Dr. Delton Coombes. HR of 112 noted as well. Patient reporting increased pain and pressure in his lower abdomen and around to his back on both sides since his appointment with Dr. Delton Coombes last week. He states he is also having constipation which he feels is making the pain worse. He states he was taking some oxycodone he had at home and that helped with the pain, but he has run out of it. Per Dr. Delton Coombes, give patient 10mg  Oxycodone once today and he will send Rx in for patient. Per Dr. Delton Coombes, ok to proceed with FOLFOX today.   Rynell Ciotti tolerated treatment without incident or complaint. Pt reports improvement of pain with oxycodone. 5FU infusing without difficulties via home infusion pump. VSS. HR improved to 89. Discharged self ambulatory in satisfactory condition.

## 2018-07-15 ENCOUNTER — Encounter (HOSPITAL_COMMUNITY): Payer: Self-pay

## 2018-07-15 ENCOUNTER — Other Ambulatory Visit (HOSPITAL_COMMUNITY): Payer: Self-pay | Admitting: Nurse Practitioner

## 2018-07-15 ENCOUNTER — Inpatient Hospital Stay (HOSPITAL_COMMUNITY): Payer: Medicare PPO

## 2018-07-15 ENCOUNTER — Other Ambulatory Visit: Payer: Self-pay

## 2018-07-15 VITALS — BP 108/75 | HR 115 | Temp 99.1°F | Resp 18

## 2018-07-15 DIAGNOSIS — M542 Cervicalgia: Secondary | ICD-10-CM | POA: Diagnosis not present

## 2018-07-15 DIAGNOSIS — C187 Malignant neoplasm of sigmoid colon: Secondary | ICD-10-CM | POA: Diagnosis not present

## 2018-07-15 DIAGNOSIS — R0609 Other forms of dyspnea: Secondary | ICD-10-CM | POA: Diagnosis not present

## 2018-07-15 DIAGNOSIS — C189 Malignant neoplasm of colon, unspecified: Secondary | ICD-10-CM

## 2018-07-15 DIAGNOSIS — I10 Essential (primary) hypertension: Secondary | ICD-10-CM | POA: Diagnosis not present

## 2018-07-15 DIAGNOSIS — Z5111 Encounter for antineoplastic chemotherapy: Secondary | ICD-10-CM | POA: Diagnosis not present

## 2018-07-15 DIAGNOSIS — Z452 Encounter for adjustment and management of vascular access device: Secondary | ICD-10-CM | POA: Diagnosis not present

## 2018-07-15 DIAGNOSIS — E1142 Type 2 diabetes mellitus with diabetic polyneuropathy: Secondary | ICD-10-CM | POA: Diagnosis not present

## 2018-07-15 DIAGNOSIS — C7800 Secondary malignant neoplasm of unspecified lung: Secondary | ICD-10-CM | POA: Diagnosis not present

## 2018-07-15 DIAGNOSIS — C787 Secondary malignant neoplasm of liver and intrahepatic bile duct: Principal | ICD-10-CM

## 2018-07-15 MED ORDER — HEPARIN SOD (PORK) LOCK FLUSH 100 UNIT/ML IV SOLN
500.0000 [IU] | Freq: Once | INTRAVENOUS | Status: AC | PRN
  Administered 2018-07-15: 500 [IU]

## 2018-07-15 MED ORDER — SODIUM CHLORIDE 0.9 % IV SOLN
INTRAVENOUS | Status: DC
  Administered 2018-07-15: 15:00:00 via INTRAVENOUS

## 2018-07-15 MED ORDER — SODIUM CHLORIDE 0.9% FLUSH
10.0000 mL | INTRAVENOUS | Status: DC | PRN
  Administered 2018-07-15: 10 mL
  Filled 2018-07-15: qty 10

## 2018-07-15 MED ORDER — ALTEPLASE 2 MG IJ SOLR: 2.0000 mg | Freq: Once | INTRAMUSCULAR | Status: DC | PRN

## 2018-07-15 NOTE — Patient Instructions (Addendum)
[place] at [facility] Discharge Instructions  5FU pump discontinued and portacath flushed per protocol today with extra IV hydration given as well. Follow-up as scheduled. Call clinic for any questions or concerns   Thank you for choosing [facility] to provide your oncology and hematology care.  To afford each patient quality time with our provider, please arrive at least 15 minutes before your scheduled appointment time.   If you have a lab appointment with the [facility] please come in thru the  Main Entrance and check in at the main information desk  You need to re-schedule your appointment should you arrive 10 or more minutes late.  We strive to give you quality time with our providers, and arriving late affects you and other patients whose appointments are after yours.  Also, if you no show three or more times for appointments you may be dismissed from the clinic at the providers discretion.     Again, thank you for choosing [facility].  Our hope is that these requests will decrease the amount of time that you wait before being seen by our physicians.       _____________________________________________________________  Should you have questions after your visit to [facility], please contact our office at [phone] between the hours of 8:00 a.m. and 4:30 p.m.  Voicemails left after 4:00 p.m. will not be returned until the following business day.  For prescription refill requests, have your pharmacy contact our office and allow 72 hours.    Cancer Center Support Programs:   > Cancer Support Group  2nd Tuesday of the month 1pm-2pm, Journey Room

## 2018-07-15 NOTE — Progress Notes (Signed)
1505 Pt feeling extremely fatigued with decreased appetite and limited fluid intake. Reviewed this information and vital signs with Francene Finders NP and order obtained for hydration of 500 ml NS over 1 hour with repeat tomorrow if necessary                                    Christopher Friedman tolerated hydration well and felt a liitle better upon discharge 5FU pump discontinued as well with portacath flushed easily per protocol. Pt discharged via wheelchair in satisfactory condition accompanied by family member

## 2018-07-16 NOTE — Progress Notes (Signed)
24 hour follow up -patient called today requesting ivf hydration for tomorrow. Patient continues to not feel well, no energy, not eating much.

## 2018-07-17 ENCOUNTER — Ambulatory Visit (HOSPITAL_COMMUNITY): Payer: Medicare PPO

## 2018-07-27 ENCOUNTER — Inpatient Hospital Stay (HOSPITAL_COMMUNITY): Payer: Medicare PPO

## 2018-07-27 ENCOUNTER — Inpatient Hospital Stay (HOSPITAL_BASED_OUTPATIENT_CLINIC_OR_DEPARTMENT_OTHER): Payer: Medicare PPO | Admitting: Hematology

## 2018-07-27 ENCOUNTER — Encounter (HOSPITAL_COMMUNITY): Payer: Self-pay

## 2018-07-27 ENCOUNTER — Other Ambulatory Visit: Payer: Self-pay

## 2018-07-27 ENCOUNTER — Encounter (HOSPITAL_COMMUNITY): Payer: Self-pay | Admitting: Hematology

## 2018-07-27 VITALS — BP 116/78 | HR 82 | Temp 98.0°F | Resp 18

## 2018-07-27 DIAGNOSIS — R0609 Other forms of dyspnea: Secondary | ICD-10-CM | POA: Diagnosis not present

## 2018-07-27 DIAGNOSIS — E1142 Type 2 diabetes mellitus with diabetic polyneuropathy: Secondary | ICD-10-CM | POA: Diagnosis not present

## 2018-07-27 DIAGNOSIS — C189 Malignant neoplasm of colon, unspecified: Secondary | ICD-10-CM

## 2018-07-27 DIAGNOSIS — E1165 Type 2 diabetes mellitus with hyperglycemia: Secondary | ICD-10-CM | POA: Diagnosis not present

## 2018-07-27 DIAGNOSIS — Z452 Encounter for adjustment and management of vascular access device: Secondary | ICD-10-CM | POA: Diagnosis not present

## 2018-07-27 DIAGNOSIS — M542 Cervicalgia: Secondary | ICD-10-CM | POA: Diagnosis not present

## 2018-07-27 DIAGNOSIS — C7800 Secondary malignant neoplasm of unspecified lung: Secondary | ICD-10-CM | POA: Diagnosis not present

## 2018-07-27 DIAGNOSIS — C787 Secondary malignant neoplasm of liver and intrahepatic bile duct: Secondary | ICD-10-CM

## 2018-07-27 DIAGNOSIS — E782 Mixed hyperlipidemia: Secondary | ICD-10-CM | POA: Diagnosis not present

## 2018-07-27 DIAGNOSIS — C187 Malignant neoplasm of sigmoid colon: Secondary | ICD-10-CM

## 2018-07-27 DIAGNOSIS — I10 Essential (primary) hypertension: Secondary | ICD-10-CM

## 2018-07-27 DIAGNOSIS — Z5111 Encounter for antineoplastic chemotherapy: Secondary | ICD-10-CM | POA: Diagnosis not present

## 2018-07-27 LAB — CBC WITH DIFFERENTIAL/PLATELET
ABS IMMATURE GRANULOCYTES: 0.03 10*3/uL (ref 0.00–0.07)
Basophils Absolute: 0.1 10*3/uL (ref 0.0–0.1)
Basophils Relative: 1 %
EOS ABS: 0.3 10*3/uL (ref 0.0–0.5)
Eosinophils Relative: 3 %
HCT: 36.2 % — ABNORMAL LOW (ref 39.0–52.0)
Hemoglobin: 11.7 g/dL — ABNORMAL LOW (ref 13.0–17.0)
IMMATURE GRANULOCYTES: 0 %
Lymphocytes Relative: 17 %
Lymphs Abs: 1.3 10*3/uL (ref 0.7–4.0)
MCH: 28.5 pg (ref 26.0–34.0)
MCHC: 32.3 g/dL (ref 30.0–36.0)
MCV: 88.3 fL (ref 80.0–100.0)
Monocytes Absolute: 0.6 10*3/uL (ref 0.1–1.0)
Monocytes Relative: 8 %
Neutro Abs: 5.6 10*3/uL (ref 1.7–7.7)
Neutrophils Relative %: 71 %
PLATELETS: 260 10*3/uL (ref 150–400)
RBC: 4.1 MIL/uL — ABNORMAL LOW (ref 4.22–5.81)
RDW: 15.9 % — AB (ref 11.5–15.5)
WBC: 7.8 10*3/uL (ref 4.0–10.5)
nRBC: 0 % (ref 0.0–0.2)

## 2018-07-27 LAB — COMPREHENSIVE METABOLIC PANEL
ALT: 17 U/L (ref 0–44)
AST: 26 U/L (ref 15–41)
Albumin: 4.2 g/dL (ref 3.5–5.0)
Alkaline Phosphatase: 180 U/L — ABNORMAL HIGH (ref 38–126)
Anion gap: 9 (ref 5–15)
BUN: 18 mg/dL (ref 8–23)
CO2: 25 mmol/L (ref 22–32)
Calcium: 9.5 mg/dL (ref 8.9–10.3)
Chloride: 101 mmol/L (ref 98–111)
Creatinine, Ser: 0.88 mg/dL (ref 0.61–1.24)
GFR calc Af Amer: >60 mL/min (ref >60)
GFR calc non Af Amer: >60 mL/min (ref >60)
Glucose, Bld: 134 mg/dL — ABNORMAL HIGH (ref 70–99)
Potassium: 3.9 mmol/L (ref 3.5–5.1)
Sodium: 135 mmol/L (ref 135–145)
Total Bilirubin: 0.6 mg/dL (ref 0.3–1.2)
Total Protein: 8 g/dL (ref 6.5–8.1)

## 2018-07-27 LAB — MAGNESIUM: MAGNESIUM: 1.9 mg/dL (ref 1.7–2.4)

## 2018-07-27 MED ORDER — PALONOSETRON HCL INJECTION 0.25 MG/5ML
INTRAVENOUS | Status: AC
  Filled 2018-07-27: qty 5

## 2018-07-27 MED ORDER — FLUOROURACIL CHEMO INJECTION 2.5 GM/50ML
320.0000 mg/m2 | Freq: Once | INTRAVENOUS | Status: AC
  Administered 2018-07-27: 700 mg via INTRAVENOUS
  Filled 2018-07-27: qty 14

## 2018-07-27 MED ORDER — OXALIPLATIN CHEMO INJECTION 100 MG/20ML
68.0000 mg/m2 | Freq: Once | INTRAVENOUS | Status: AC
  Administered 2018-07-27: 145 mg via INTRAVENOUS
  Filled 2018-07-27: qty 9

## 2018-07-27 MED ORDER — SODIUM CHLORIDE 0.9 % IV SOLN
10.0000 mg | Freq: Once | INTRAVENOUS | Status: AC
  Administered 2018-07-27: 10 mg via INTRAVENOUS
  Filled 2018-07-27: qty 10

## 2018-07-27 MED ORDER — LEUCOVORIN CALCIUM INJECTION 350 MG
400.0000 mg/m2 | Freq: Once | INTRAVENOUS | Status: AC
  Administered 2018-07-27: 864 mg via INTRAVENOUS
  Filled 2018-07-27: qty 43.2

## 2018-07-27 MED ORDER — PALONOSETRON HCL INJECTION 0.25 MG/5ML
0.2500 mg | Freq: Once | INTRAVENOUS | Status: AC
  Administered 2018-07-27: 0.25 mg via INTRAVENOUS

## 2018-07-27 MED ORDER — DEXTROSE 5 % IV SOLN
Freq: Once | INTRAVENOUS | Status: AC
  Administered 2018-07-27: 10:00:00 via INTRAVENOUS

## 2018-07-27 MED ORDER — SODIUM CHLORIDE 0.9 % IV SOLN
1920.0000 mg/m2 | INTRAVENOUS | Status: DC
  Administered 2018-07-27: 4150 mg via INTRAVENOUS
  Filled 2018-07-27: qty 83

## 2018-07-27 MED ORDER — SODIUM CHLORIDE 0.9 % IV SOLN: 2400.0000 mg/m2 | INTRAVENOUS | Status: DC

## 2018-07-27 NOTE — Patient Instructions (Signed)
[facility] Discharge Instructions for Patients Receiving Chemotherapy  Today you received the following chemotherapy agents   To help prevent nausea and vomiting after your treatment, we encourage you to take your nausea medication   If you develop nausea and vomiting that is not controlled by your nausea medication, call the clinic.   BELOW ARE SYMPTOMS THAT SHOULD BE REPORTED IMMEDIATELY:  *FEVER GREATER THAN 100.5 F  *CHILLS WITH OR WITHOUT FEVER  NAUSEA AND VOMITING THAT IS NOT CONTROLLED WITH YOUR NAUSEA MEDICATION  *UNUSUAL SHORTNESS OF BREATH  *UNUSUAL BRUISING OR BLEEDING  TENDERNESS IN MOUTH AND THROAT WITH OR WITHOUT PRESENCE OF ULCERS  *URINARY PROBLEMS  *BOWEL PROBLEMS  UNUSUAL RASH Items with * indicate a potential emergency and should be followed up as soon as possible.  Feel free to call the clinic should you have any questions or concerns. The clinic phone number is [phone].  Please show the CHEMO ALERT CARD at check-in to the Emergency Department and triage nurse.   

## 2018-07-27 NOTE — Progress Notes (Signed)
[address] [place], [place] [id number]   CLINIC:  Medical Oncology/Hematology  PCP:  [name], MD [place] [address] [phone]   REASON FOR VISIT:  Follow-up for metastatic colon cancer to the liver  CURRENT THERAPY:FOLFOX every 2 weeks.   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 ([place])   05/28/2017 - 06/09/2018 Chemotherapy    The patient had palonosetron (ALOXI) injection 0.25 mg, 0.25 mg, Intravenous,  Once, 18 of 22 cycles Administration: 0.25 mg (09/03/2017), 0.25 mg (10/01/2017), 0.25 mg (10/15/2017), 0.25 mg (10/29/2017), 0.25 mg (09/17/2017), 0.25 mg (03/02/2018), 0.25 mg (03/16/2018), 0.25 mg (03/30/2018), 0.25 mg (04/13/2018), 0.25 mg (05/13/2018), 0.25 mg (05/27/2018) irinotecan (CAMPTOSAR) 400 mg in dextrose 5 % 500 mL chemo infusion, 180 mg/m2, Intravenous,  Once, 18 of 22 cycles Dose modification: 144 mg/m2 (Cycle 4, Reason: Provider Judgment) Administration: 320 mg (09/03/2017), 320 mg (10/01/2017), 320 mg (10/15/2017), 320 mg (10/29/2017), 320 mg (09/17/2017), 320 mg (03/02/2018), 320 mg (03/16/2018), 320 mg (03/30/2018), 320 mg (04/13/2018), 320 mg (05/13/2018), 320 mg (05/27/2018) leucovorin 888 mg in dextrose 5 % 250 mL infusion, 400 mg/m2, Intravenous,  Once, 18 of 22 cycles Administration: 900 mg (09/03/2017), 900 mg (10/01/2017), 900 mg (10/15/2017), 900 mg (10/29/2017), 900 mg (09/17/2017), 900 mg (03/02/2018), 900 mg (03/16/2018), 900 mg (03/30/2018), 900 mg (04/13/2018), 900 mg (05/13/2018), 900 mg (05/27/2018) fluorouracil (ADRUCIL) chemo injection 900 mg, 400 mg/m2, Intravenous,  Once, 18 of 22 cycles Dose modification: 320 mg/m2 (Cycle 4, Reason: Provider Judgment) Administration: 700 mg (09/03/2017), 700 mg (10/01/2017), 700 mg (10/15/2017), 700 mg (10/29/2017), 700 mg (09/17/2017), 700 mg (03/02/2018), 700 mg (03/16/2018), 700 mg (03/30/2018), 700 mg (04/13/2018), 700 mg (05/13/2018), 700 mg (05/27/2018) fluorouracil  (ADRUCIL) 5,350 mg in sodium chloride 0.9 % 143 mL chemo infusion, 2,400 mg/m2, Intravenous, 1 Day/Dose, 18 of 22 cycles Dose modification: 1,920 mg/m2 (Cycle 4, Reason: Provider Judgment) Administration: 4,250 mg (09/03/2017), 4,250 mg (10/01/2017), 4,250 mg (10/15/2017), 4,250 mg (10/29/2017), 4,250 mg (09/17/2017), 4,250 mg (03/02/2018), 4,250 mg (03/16/2018), 4,250 mg (03/30/2018), 4,250 mg (04/13/2018), 4,250 mg (05/13/2018), 4,250 mg (05/27/2018)  for chemotherapy treatment.     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[place])    07/13/2018 -  Chemotherapy    The patient had palonosetron (ALOXI) injection 0.25 mg, 0.25 mg, Intravenous,  Once, 2 of 4 cycles Administration: 0.25 mg (07/13/2018) leucovorin 864 mg in dextrose 5 % 250 mL infusion, 400 mg/m2 = 864 mg, Intravenous,  Once, 2 of 4 cycles Administration: 864 mg (07/13/2018) oxaliplatin (ELOXATIN) 145 mg in dextrose 5 % 500 mL chemo infusion, 68 mg/m2 = 145 mg (80 % of original dose 85 mg/m2), Intravenous,  Once, 2 of 4 cycles Dose modification: 68 mg/m2 (80 % of original dose 85 mg/m2, Cycle 1, Reason: Other (see comments), Comment: Pre-existing neuropathy) Administration: 145 mg (07/13/2018) fluorouracil (ADRUCIL) chemo injection 700 mg, 320 mg/m2 = 700 mg (80 % of original dose 400 mg/m2), Intravenous,  Once, 2 of 4 cycles Dose modification: 320 mg/m2 (80 % of original dose 400 mg/m2, Cycle 1, Reason: Provider Judgment) Administration: 700 mg (07/13/2018) fluorouracil (ADRUCIL) 4,150 mg in sodium chloride 0.9 % 67 mL chemo infusion, 1,920 mg/m2 = 4,150 mg (80 % of original dose 2,400 mg/m2), Intravenous, 1 Day/Dose, 2 of 4 cycles Dose modification: 1,920 mg/m2 (80 % of original dose 2,400 mg/m2, Cycle 1, Reason: Provider Judgment) Administration: 4,150 mg (07/13/2018)  for chemotherapy treatment.       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 68 y.o. male returns for routine follow-up and consideration for next cycle of chemotherapy. He is her today alone. He states that the pain he was experiencing has gone away. He states that he has been doing well since his first treatment. He was educated on the importance of social distancing and staying home. Denies any nausea, vomiting, or diarrhea. Denies any new pains. Had not noticed any recent bleeding such as epistaxis, hematuria or hematochezia. Denies recent chest pain on exertion, shortness of breath on minimal exertion, pre-syncopal episodes, or palpitations. Denies any numbness or tingling in hands or feet. Denies any recent fevers, infections, or recent hospitalizations. Patient reports appetite at 100% and energy level at 50%.      REVIEW OF SYSTEMS:  Review of Systems  All other systems reviewed and are negative.    PAST MEDICAL/SURGICAL HISTORY:  Past Medical History:  Diagnosis Date  . Colon cancer ([place])   . Diabetes ([place])   . GERD (gastroesophageal reflux disease)   . Hypertension    Past Surgical History:  Procedure Laterality Date  . APPENDECTOMY     during colon surgery  . COLON SURGERY    . IR RADIOLOGIST EVAL & MGMT  11/26/2017  . RADIOFREQUENCY ABLATION N/A 12/10/2017   Procedure: CT MICROWAVE THERMAL ABLATION (LIVER);  Surgeon: Markus Daft, MD;  Location: [facility];  Service: Anesthesiology;  Laterality: N/A;  . SHOULDER SURGERY  2003     SOCIAL HISTORY:  Social History   Socioeconomic History  . Marital status: Married    Spouse name: Not on file  . Number of children: Not on file  . Years of education: Not on file  . Highest education level: Not on file  Occupational History  . Not on file  Social Needs  . Financial resource strain: Not on file  . Food insecurity:    Worry: Not on file     Inability: Not on file  . Transportation needs:    Medical: Not on file    Non-medical: Not on file  Tobacco Use  . Smoking status: Former Smoker    Last attempt to quit: 04/05/1995    Years since quitting: 23.3  . Smokeless tobacco: Never Used  Substance and Sexual Activity  . Alcohol use: Not Currently    Alcohol/week: 0.0 standard drinks    Comment: used to drink but stopped about 20 years ago  . Drug use: Never  . Sexual activity: Not on file  Lifestyle  . Physical activity:    Days per week: Not on file    Minutes per session: Not on file  . Stress: Not on file  Relationships  . Social connections:    Talks on phone: Not on file    Gets together: Not on file    Attends religious service: Not on file    Active member of club or organization: Not on file    Attends meetings of clubs or organizations: Not on file    Relationship status: Not on file  . Intimate partner violence:    Fear of current or ex partner: Not on file    Emotionally abused: Not on file    Physically abused: Not  on file    Forced sexual activity: Not on file  Other Topics Concern  . Not on file  Social History Narrative  . Not on file    FAMILY HISTORY:  Family History  Problem Relation Age of Onset  . Diabetes Mother   . Cancer Father   . Bone cancer Father   . Colon cancer Neg Hx   . Colon polyps Neg Hx     CURRENT MEDICATIONS:  Outpatient Encounter Medications as of 07/27/2018  Medication Sig  . Alpha-Lipoic Acid 200 MG CAPS Take 1 capsule by mouth daily.  . Ascorbic Acid (VITAMIN C) 1000 MG tablet Take 2,000 mg by mouth daily.   . ASHWAGANDHA PO Take 800 mg by mouth daily.  Marland Kitchen aspirin 81 MG tablet Take 81 mg by mouth daily.  Marland Kitchen atorvastatin (LIPITOR) 10 MG tablet Take 10 mg by mouth daily.   Marland Kitchen BEE POLLEN PO Take 1 capsule by mouth daily.   . Blood Glucose Monitoring Suppl (ONE TOUCH ULTRA MINI) W/DEVICE KIT USE TO CHECK BLOOD SUGAR DAILY.  . cholecalciferol (VITAMIN D) 1000 units  tablet Take 5,000 Units by mouth daily.   Marland Kitchen CINNAMON PO Take 2,000 mg by mouth daily.   Marland Kitchen Cod Liver Oil CAPS Take 1 capsule by mouth daily.  . Coenzyme Q10 (CO Q 10) 100 MG CAPS Take 1 capsule by mouth daily.   . Cyanocobalamin (VITAMIN B-12) 5000 MCG TBDP Take 5,000 mcg by mouth daily.  . cyclobenzaprine (FLEXERIL) 10 MG tablet TAKE ONE TABLET BY MOUTH EVERY EVENING AT BEDTIME AS NEEDED FOR MUSCLE SPASMS.  Marland Kitchen diphenoxylate-atropine (LOMOTIL) 2.5-0.025 MG tablet Take 1 tablet by mouth every 6 (six) hours as needed for diarrhea or loose stools (stomach cramps).  . Flax Oil-Fish Oil-Borage Oil (FISH OIL-FLAX OIL-BORAGE OIL) CAPS Take 1 capsule by mouth daily.  . fluorouracil CALGB 46286 in sodium chloride 0.9 % 150 mL Inject into the vein over 96 hr.  . Garlic 3817 MG CAPS Take 2 capsules by mouth daily.   Marland Kitchen glipiZIDE (GLUCOTROL XL) 10 MG 24 hr tablet Take 10 mg by mouth daily.   Marland Kitchen glipiZIDE (GLUCOTROL XL) 5 MG 24 hr tablet TAKE ONE TABLET BY MOUTH DAILY WITH BREAKFAST.  Marland Kitchen Glucosamine-Chondroit-Collagen ([employer] GLUCO-CHONDROIT PLUS UC-II PO) Take 1 tablet by mouth daily.   . IRINOTECAN HCL IV Inject into the vein every 14 (fourteen) days.   Marland Kitchen LECITHIN PO Take 1 capsule by mouth daily. 1200 mg  . LEUCOVORIN CALCIUM IV Inject into the vein every 14 (fourteen) days.   Marland Kitchen lidocaine-prilocaine (EMLA) cream Apply small amount over port site and cover with plastic wrap one hour prior to appointment.  . magnesium oxide (MAG-OX) 400 MG tablet Take 400 mg by mouth daily.   . metFORMIN (GLUCOPHAGE) 500 MG tablet Take 1 tablet (500 mg total) by mouth 2 (two) times daily with a meal. (Patient taking differently: Take 1,000 mg by mouth 2 (two) times daily with a meal. )  . milk thistle 175 MG tablet Take 175 mg by mouth daily.  Marland Kitchen OVER THE COUNTER MEDICATION Take 1 capsule by mouth daily. Mushroom Complex 750 mg  . OVER THE COUNTER MEDICATION Take 1 capsule by mouth daily. Cura Med 750 mg  . oxyCODONE 10 MG TABS  Take 1 tablet (10 mg total) by mouth 2 (two) times daily as needed for severe pain.  Marland Kitchen oxyCODONE-acetaminophen (PERCOCET/ROXICET) 5-325 MG tablet Take 1 tablet by mouth every 6 (six) hours as needed. for  pain  . prochlorperazine (COMPAZINE) 10 MG tablet Take 1 tablet (10 mg total) by mouth every 6 (six) hours as needed for nausea or vomiting.  . valsartan-hydrochlorothiazide (DIOVAN-HCT) 160-25 MG tablet Take 1 tablet by mouth daily.   Facility-Administered Encounter Medications as of 07/27/2018  Medication  . sodium chloride flush (NS) 0.9 % injection 10 mL    ALLERGIES:  No Known Allergies   PHYSICAL EXAM:  ECOG Performance status: 1  Vitals:   07/27/18 0844  BP: 118/75  Pulse: 87  Resp: 18  Temp: (!) 97.1 F (36.2 C)  SpO2: 98%   Filed Weights   07/27/18 0844  Weight: 200 lb (90.7 kg)    Physical Exam Constitutional:      Appearance: Normal appearance.  Cardiovascular:     Rate and Rhythm: Normal rate and regular rhythm.     Heart sounds: Normal heart sounds.  Pulmonary:     Effort: Pulmonary effort is normal.     Breath sounds: Normal breath sounds.  Abdominal:     General: There is no distension.     Palpations: Abdomen is soft.  Musculoskeletal:        General: No swelling.  Skin:    General: Skin is warm.  Neurological:     General: No focal deficit present.     Mental Status: He is alert and oriented to person, place, and time.  Psychiatric:        Mood and Affect: Mood normal.        Behavior: Behavior normal.      LABORATORY DATA:  I have reviewed the labs as listed.  CBC    Component Value Date/Time   WBC 7.8 07/27/2018 0837   RBC 4.10 (L) 07/27/2018 0837   HGB 11.7 (L) 07/27/2018 0837   HCT 36.2 (L) 07/27/2018 0837   PLT 260 07/27/2018 0837   MCV 88.3 07/27/2018 0837   MCH 28.5 07/27/2018 0837   MCHC 32.3 07/27/2018 0837   RDW 15.9 (H) 07/27/2018 0837   LYMPHSABS 1.3 07/27/2018 0837   MONOABS 0.6 07/27/2018 0837   EOSABS 0.3  07/27/2018 0837   BASOSABS 0.1 07/27/2018 0837   CMP Latest Ref Rng & Units 07/27/2018 07/08/2018 05/27/2018  Glucose 70 - 99 mg/dL 134(H) 173(H) 367(H)  BUN 8 - 23 mg/dL '18 14 15  ' Creatinine 0.61 - 1.24 mg/dL 0.88 0.94 0.98  Sodium 135 - 145 mmol/L 135 137 135  Potassium 3.5 - 5.1 mmol/L 3.9 4.6 4.4  Chloride 98 - 111 mmol/L 101 99 97(L)  CO2 22 - 32 mmol/L '25 26 28  ' Calcium 8.9 - 10.3 mg/dL 9.5 10.0 9.6  Total Protein 6.5 - 8.1 g/dL 8.0 8.0 7.8  Total Bilirubin 0.3 - 1.2 mg/dL 0.6 0.4 0.8  Alkaline Phos 38 - 126 U/L 180(H) 164(H) 133(H)  AST 15 - 41 U/L '26 28 19  ' ALT 0 - 44 U/L '17 20 16       ' DIAGNOSTIC IMAGING:  I have independently reviewed the scans and discussed with the patient.   I have reviewed Venita Lick LPN's note and agree with the documentation.  I personally performed a face-to-face visit, made revisions and my assessment and plan is as follows.    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 - He has completed 12 cycles of FOLFIRI on 10/29/2017.  He tolerated it very well.  CT CAP showed stable left upper lobe subcentimeter lung nodule, segment 4 lesion in the liver has decreased to 2.4 cm.  -He underwent left hepatic lobe microwave ablation on 12/10/2017.  He did very well with minimal pain and complications.  He is able to do all his day-to-day activities without any problems. -CT scan of the abdomen pelvis on 02/05/2018 showed left hepatic lesion with central high attenuation and a large area of low-attenuation.  There is a new left hepatic lobe lesion measuring 1.3 cm.  CT chest showed similar appearing small nodules. - MRI of the liver dated 02/25/2018 shows  heterogeneous mass which surrounds the right side of the ablation defect and extends inferiorly in segment 4B measuring 6.9 x 5.6 cm, segment 2 lesion measuring 2.6 cm, segment 8 subcapsular 1.6 cm lesion.  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 - He has at least 3 new lesions and questionable capsular metastasis.  I do not believe he is a candidate for local therapy.  I have recommended going back on chemotherapy.  His last chemo was in July 2019, dose reduced FOLFIRI. - 6 cycles of FOLFIRI from 03/02/2018 through 05/27/2018. - CT CAP on 06/16/2018 shows progression of pulmonary nodules bilaterally, largest left upper lobe pulmonary nodule measuring 1.9 cm and right upper lobe pulmonary nodule 1.8 cm.  Right cardiophrenic angle adenopathy is new at 1.3 cm.  2 liver lesions, largest measuring 7.4 x 6.4 cm, a second largest measuring 5.5 x 4.4 cm.  Both have increased in size.  Development of extensive omental/peritoneal metastasis.  An index right abdominal omental implant measures 2.2 x 3.3 cm.  No bone metastasis. - I have recommended FOLFOX chemotherapy.  He had previous history of bleeding with Avastin requiring transfusion.  He is not a candidate for EGFR antibody. -He started cycle 1 of FOLFOX on 07/13/2018.  Because of his pre-existing neuropathy, I have started oxaliplatin at 68 mg per metered square.  He tolerated it very well. -His abdominal pain has also improved since first cycle.  He is not requiring any narcotic. -He has an appointment to see Dr. Leslie Andrea at [facility] tomorrow to see if they have any available clinical trials if he progresses on the current regimen.  2.  Diabetes: -He is taking metformin 100 mg daily.  If he increases, he gets diarrhea. - We have increased Glucotrol XL to 15 mg.  3.  Peripheral neuropathy: -This is likely from diabetes.  He has mild numbness in the feet predominantly at bedtime.  -His neuropathy is stable since we started oxaliplatin on 07/13/2018.  4.  Hypertension: This is well controlled on valsartan HCTZ 160-25 mg daily.      Orders placed this encounter:  No orders of the defined types were placed in this encounter.     Derek Jack, MD [place] [phone]

## 2018-07-27 NOTE — Progress Notes (Signed)
07/27/18  Received written notice from Dr Delton Coombes clarifying today's dose of 5-FU continuous infusion pump at 1920 mg/m2.  Plan for today has been modified to reflect dose.  Dr. Rhys Martini, PharmD

## 2018-07-27 NOTE — Patient Instructions (Addendum)
[facility] at [facility] Discharge Instructions  You were seen today by Dr. Katragadda. He went over your recent lab results. He will see you back in 2 weeks for labs, treatment and follow up.   Thank you for choosing [facility] at [facility] to provide your oncology and hematology care.  To afford each patient quality time with our provider, please arrive at least 15 minutes before your scheduled appointment time.   If you have a lab appointment with the Cancer Center please come in thru the  Main Entrance and check in at the main information desk  You need to re-schedule your appointment should you arrive 10 or more minutes late.  We strive to give you quality time with our providers, and arriving late affects you and other patients whose appointments are after yours.  Also, if you no show three or more times for appointments you may be dismissed from the clinic at the providers discretion.     Again, thank you for choosing [facility].  Our hope is that these requests will decrease the amount of time that you wait before being seen by our physicians.       _____________________________________________________________  Should you have questions after your visit to [facility], please contact our office at [phone] between the hours of 8:00 a.m. and 4:30 p.m.  Voicemails left after 4:00 p.m. will not be returned until the following business day.  For prescription refill requests, have your pharmacy contact our office and allow 72 hours.    Cancer Center Support Programs:   > Cancer Support Group  2nd Tuesday of the month 1pm-2pm, Journey Room    

## 2018-07-27 NOTE — Progress Notes (Signed)
Pt seen by Dr. Delton Coombes. Labs reviewed by MD. VO received to proceed with treatment. VSS. Labs within parameters.    Treatment given today per MD orders. Tolerated infusion without adverse affects. Vital signs stable. No complaints at this time. Discharged from clinic ambulatory. 5FU pump infusing per protocol. RUN located on the top R hand corner of pump. F/U with [facility] as scheduled.

## 2018-07-27 NOTE — Assessment & Plan Note (Signed)
1.  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 - He has completed 12 cycles of FOLFIRI on 10/29/2017.  He tolerated it very well.  CT CAP showed stable left upper lobe subcentimeter lung nodule, segment 4 lesion in the liver has decreased to 2.4 cm.  -He underwent left hepatic lobe microwave ablation on 12/10/2017.  He did very well with minimal pain and complications.  He is able to do all his day-to-day activities without any problems. -CT scan of the abdomen pelvis on 02/05/2018 showed left hepatic lesion with central high attenuation and a large area of low-attenuation.  There is a new left hepatic lobe lesion measuring 1.3 cm.  CT chest showed similar appearing small nodules. - MRI of the liver dated 02/25/2018 shows heterogeneous mass which surrounds the right side of the ablation defect and extends inferiorly in segment 4B measuring 6.9 x 5.6 cm, segment 2 lesion measuring 2.6 cm, segment 8 subcapsular 1.6 cm lesion.  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 - He has at least 3 new lesions and questionable capsular metastasis.  I do not believe he is a candidate for local therapy.  I have recommended going back on chemotherapy.  His last chemo was in July 2019, dose reduced FOLFIRI. - 6 cycles of FOLFIRI from 03/02/2018 through 05/27/2018. - CT CAP on 06/16/2018 shows progression of pulmonary nodules bilaterally, largest left upper lobe pulmonary nodule measuring 1.9 cm and right upper lobe pulmonary nodule 1.8 cm.  Right cardiophrenic angle adenopathy is new at 1.3 cm.  2 liver lesions, largest measuring 7.4 x 6.4 cm, a second largest measuring 5.5 x 4.4 cm.  Both have increased in size.  Development of extensive omental/peritoneal  metastasis.  An index right abdominal omental implant measures 2.2 x 3.3 cm.  No bone metastasis. - I have recommended FOLFOX chemotherapy.  He had previous history of bleeding with Avastin requiring transfusion.  He is not a candidate for EGFR antibody. -He started cycle 1 of FOLFOX on 07/13/2018.  Because of his pre-existing neuropathy, I have started oxaliplatin at 68 mg per metered square.  He tolerated it very well. -His abdominal pain has also improved since first cycle.  He is not requiring any narcotic. -He has an appointment to see Dr. Leslie Andrea at [facility] tomorrow to see if they have any available clinical trials if he progresses on the current regimen.  2.  Diabetes: -He is taking metformin 100 mg daily.  If he increases, he gets diarrhea. - We have increased Glucotrol XL to 15 mg.  3.  Peripheral neuropathy: -This is likely from diabetes.  He has mild numbness in the feet predominantly at bedtime. -His neuropathy is stable since we started oxaliplatin on 07/13/2018.  4.  Hypertension: This is well controlled on valsartan HCTZ 160-25 mg daily.

## 2018-07-28 ENCOUNTER — Other Ambulatory Visit: Payer: Self-pay

## 2018-07-28 DIAGNOSIS — C189 Malignant neoplasm of colon, unspecified: Secondary | ICD-10-CM | POA: Diagnosis not present

## 2018-07-28 DIAGNOSIS — C787 Secondary malignant neoplasm of liver and intrahepatic bile duct: Secondary | ICD-10-CM | POA: Diagnosis not present

## 2018-07-29 ENCOUNTER — Inpatient Hospital Stay (HOSPITAL_COMMUNITY): Payer: Medicare PPO | Attending: Hematology

## 2018-07-29 VITALS — BP 140/84 | HR 116 | Temp 99.4°F | Resp 18

## 2018-07-29 DIAGNOSIS — C787 Secondary malignant neoplasm of liver and intrahepatic bile duct: Secondary | ICD-10-CM | POA: Diagnosis not present

## 2018-07-29 DIAGNOSIS — Z5111 Encounter for antineoplastic chemotherapy: Secondary | ICD-10-CM | POA: Diagnosis not present

## 2018-07-29 DIAGNOSIS — C7800 Secondary malignant neoplasm of unspecified lung: Secondary | ICD-10-CM | POA: Insufficient documentation

## 2018-07-29 DIAGNOSIS — C187 Malignant neoplasm of sigmoid colon: Secondary | ICD-10-CM | POA: Diagnosis not present

## 2018-07-29 DIAGNOSIS — Z452 Encounter for adjustment and management of vascular access device: Secondary | ICD-10-CM | POA: Diagnosis not present

## 2018-07-29 DIAGNOSIS — C189 Malignant neoplasm of colon, unspecified: Secondary | ICD-10-CM

## 2018-07-29 MED ORDER — HEPARIN SOD (PORK) LOCK FLUSH 100 UNIT/ML IV SOLN
500.0000 [IU] | Freq: Once | INTRAVENOUS | Status: AC | PRN
  Administered 2018-07-29: 13:00:00 500 [IU]

## 2018-07-29 MED ORDER — SODIUM CHLORIDE 0.9% FLUSH
10.0000 mL | INTRAVENOUS | Status: DC | PRN
  Administered 2018-07-29: 10 mL
  Filled 2018-07-29: qty 10

## 2018-07-29 NOTE — Progress Notes (Signed)
Reviewed temperatures with no complaints of chills, body aches, cough, sore throat, or fevers at home with Dr. Delton Coombes.   Ok to remove pump and discharge the patient verbal order Dr. Delton Coombes.  Reviewed with the patient when to call the hospital or oncologist with understanding verbalized.    Chemotherapy pump removed with no complaints with port flush.  Site clean and dry with no bruising or swelling noted at site.  Band aid applied.  VSS with discharge and left ambulatory with no s/s of distress noted.

## 2018-07-29 NOTE — Patient Instructions (Signed)
[facility] at [facility]  Discharge Instructions:   _______________________________________________________________  Thank you for choosing [facility] at [facility] to provide your oncology and hematology care.  To afford each patient quality time with our providers, please arrive at least 15 minutes before your scheduled appointment.  You need to re-schedule your appointment if you arrive 10 or more minutes late.  We strive to give you quality time with our providers, and arriving late affects you and other patients whose appointments are after yours.  Also, if you no show three or more times for appointments you may be dismissed from the clinic.  Again, thank you for choosing [facility] at [facility]. Our hope is that these requests will allow you access to exceptional care and in a timely manner. _______________________________________________________________  If you have questions after your visit, please contact our office at [phone] between the hours of 8:30 a.m. and 5:00 p.m. Voicemails left after 4:30 p.m. will not be returned until the following business day. _______________________________________________________________  For prescription refill requests, have your pharmacy contact our office. _______________________________________________________________  Recommendations made by the consultant and any test results will be sent to your referring physician. _______________________________________________________________ 

## 2018-08-01 DIAGNOSIS — C189 Malignant neoplasm of colon, unspecified: Secondary | ICD-10-CM | POA: Diagnosis not present

## 2018-08-10 ENCOUNTER — Encounter (HOSPITAL_COMMUNITY): Payer: Self-pay | Admitting: Hematology

## 2018-08-10 ENCOUNTER — Other Ambulatory Visit: Payer: Self-pay

## 2018-08-10 ENCOUNTER — Inpatient Hospital Stay (HOSPITAL_COMMUNITY): Payer: Medicare PPO

## 2018-08-10 ENCOUNTER — Encounter (HOSPITAL_COMMUNITY): Payer: Self-pay

## 2018-08-10 ENCOUNTER — Inpatient Hospital Stay (HOSPITAL_BASED_OUTPATIENT_CLINIC_OR_DEPARTMENT_OTHER): Payer: Medicare PPO | Admitting: Hematology

## 2018-08-10 VITALS — BP 107/70 | HR 67 | Temp 98.3°F | Resp 18

## 2018-08-10 DIAGNOSIS — Z5111 Encounter for antineoplastic chemotherapy: Secondary | ICD-10-CM | POA: Diagnosis not present

## 2018-08-10 DIAGNOSIS — C187 Malignant neoplasm of sigmoid colon: Secondary | ICD-10-CM | POA: Diagnosis not present

## 2018-08-10 DIAGNOSIS — Z87891 Personal history of nicotine dependence: Secondary | ICD-10-CM | POA: Diagnosis not present

## 2018-08-10 DIAGNOSIS — C787 Secondary malignant neoplasm of liver and intrahepatic bile duct: Secondary | ICD-10-CM

## 2018-08-10 DIAGNOSIS — G620 Drug-induced polyneuropathy: Secondary | ICD-10-CM

## 2018-08-10 DIAGNOSIS — Z86718 Personal history of other venous thrombosis and embolism: Secondary | ICD-10-CM

## 2018-08-10 DIAGNOSIS — C189 Malignant neoplasm of colon, unspecified: Secondary | ICD-10-CM

## 2018-08-10 DIAGNOSIS — Z7901 Long term (current) use of anticoagulants: Secondary | ICD-10-CM

## 2018-08-10 DIAGNOSIS — Z452 Encounter for adjustment and management of vascular access device: Secondary | ICD-10-CM | POA: Diagnosis not present

## 2018-08-10 DIAGNOSIS — C7800 Secondary malignant neoplasm of unspecified lung: Secondary | ICD-10-CM | POA: Diagnosis not present

## 2018-08-10 LAB — COMPREHENSIVE METABOLIC PANEL
ALT: 16 U/L (ref 0–44)
AST: 28 U/L (ref 15–41)
Albumin: 3.9 g/dL (ref 3.5–5.0)
Alkaline Phosphatase: 148 U/L — ABNORMAL HIGH (ref 38–126)
Anion gap: 10 (ref 5–15)
BUN: 14 mg/dL (ref 8–23)
CO2: 26 mmol/L (ref 22–32)
Calcium: 9.5 mg/dL (ref 8.9–10.3)
Chloride: 101 mmol/L (ref 98–111)
Creatinine, Ser: 0.88 mg/dL (ref 0.61–1.24)
GFR calc Af Amer: >60 mL/min (ref >60)
GFR calc non Af Amer: >60 mL/min (ref >60)
Glucose, Bld: 122 mg/dL — ABNORMAL HIGH (ref 70–99)
Potassium: 4 mmol/L (ref 3.5–5.1)
Sodium: 137 mmol/L (ref 135–145)
Total Bilirubin: 0.7 mg/dL (ref 0.3–1.2)
Total Protein: 7.5 g/dL (ref 6.5–8.1)

## 2018-08-10 LAB — CBC WITH DIFFERENTIAL/PLATELET
Abs Immature Granulocytes: 0.02 10*3/uL (ref 0.00–0.07)
Basophils Absolute: 0.1 10*3/uL (ref 0.0–0.1)
Basophils Relative: 1 %
Eosinophils Absolute: 0.2 10*3/uL (ref 0.0–0.5)
Eosinophils Relative: 2 %
HCT: 36.8 % — ABNORMAL LOW (ref 39.0–52.0)
Hemoglobin: 11.8 g/dL — ABNORMAL LOW (ref 13.0–17.0)
Immature Granulocytes: 0 %
Lymphocytes Relative: 24 %
Lymphs Abs: 1.7 10*3/uL (ref 0.7–4.0)
MCH: 28.2 pg (ref 26.0–34.0)
MCHC: 32.1 g/dL (ref 30.0–36.0)
MCV: 87.8 fL (ref 80.0–100.0)
Monocytes Absolute: 0.7 10*3/uL (ref 0.1–1.0)
Monocytes Relative: 11 %
Neutro Abs: 4.2 10*3/uL (ref 1.7–7.7)
Neutrophils Relative %: 62 %
Platelets: 167 10*3/uL (ref 150–400)
RBC: 4.19 MIL/uL — ABNORMAL LOW (ref 4.22–5.81)
RDW: 16.5 % — ABNORMAL HIGH (ref 11.5–15.5)
WBC: 6.9 10*3/uL (ref 4.0–10.5)
nRBC: 0 % (ref 0.0–0.2)

## 2018-08-10 LAB — MAGNESIUM: Magnesium: 1.9 mg/dL (ref 1.7–2.4)

## 2018-08-10 MED ORDER — PALONOSETRON HCL INJECTION 0.25 MG/5ML
0.2500 mg | Freq: Once | INTRAVENOUS | Status: AC
  Administered 2018-08-10: 0.25 mg via INTRAVENOUS
  Filled 2018-08-10: qty 5

## 2018-08-10 MED ORDER — DEXTROSE 5 % IV SOLN
Freq: Once | INTRAVENOUS | Status: AC
  Administered 2018-08-10: 10:00:00 via INTRAVENOUS

## 2018-08-10 MED ORDER — OXALIPLATIN CHEMO INJECTION 100 MG/20ML
68.0000 mg/m2 | Freq: Once | INTRAVENOUS | Status: AC
  Administered 2018-08-10: 145 mg via INTRAVENOUS
  Filled 2018-08-10: qty 20

## 2018-08-10 MED ORDER — LEUCOVORIN CALCIUM 500 MG/50ML IJ SOLN
400.0000 mg/m2 | Freq: Once | INTRAVENOUS | Status: AC
  Administered 2018-08-10: 864 mg via INTRAVENOUS
  Filled 2018-08-10: qty 250

## 2018-08-10 MED ORDER — FLUOROURACIL CHEMO INJECTION 2.5 GM/50ML
320.0000 mg/m2 | Freq: Once | INTRAVENOUS | Status: AC
  Administered 2018-08-10: 700 mg via INTRAVENOUS
  Filled 2018-08-10: qty 14

## 2018-08-10 MED ORDER — SODIUM CHLORIDE 0.9 % IV SOLN
10.0000 mg | Freq: Once | INTRAVENOUS | Status: AC
  Administered 2018-08-10: 10 mg via INTRAVENOUS
  Filled 2018-08-10: qty 10

## 2018-08-10 MED ORDER — SODIUM CHLORIDE 0.9 % IV SOLN
1920.0000 mg/m2 | INTRAVENOUS | Status: DC
  Administered 2018-08-10: 14:00:00 4150 mg via INTRAVENOUS
  Filled 2018-08-10: qty 83

## 2018-08-10 NOTE — Progress Notes (Signed)
Pt seen by Dr. Delton Coombes. Labs reviewed and within parameters for treatment. VSS.

## 2018-08-10 NOTE — Progress Notes (Signed)
08/10/18  Received written notice from Dr Delton Coombes clarifying today's dose of 5-FU continuous infusion pump at 1920 mg/m2.  Plan for today has been modified to reflect dose and future plans.  Dr. Rhys Martini, PharmD

## 2018-08-10 NOTE — Progress Notes (Signed)
[name], [name] [id number]   CLINIC:  Medical Oncology/Hematology  PCP:  [name], MD [place] [id number] [phone]   REASON FOR VISIT:  Follow-up for  metastatic colon cancer to the liver  CURRENT THERAPY:FOLFOX every 2 weeks.   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 ([place])   05/28/2017 - 06/09/2018 Chemotherapy    The patient had palonosetron (ALOXI) injection 0.25 mg, 0.25 mg, Intravenous,  Once, 18 of 22 cycles Administration: 0.25 mg (09/03/2017), 0.25 mg (10/01/2017), 0.25 mg (10/15/2017), 0.25 mg (10/29/2017), 0.25 mg (09/17/2017), 0.25 mg (03/02/2018), 0.25 mg (03/16/2018), 0.25 mg (03/30/2018), 0.25 mg (04/13/2018), 0.25 mg (05/13/2018), 0.25 mg (05/27/2018) irinotecan (CAMPTOSAR) 400 mg in dextrose 5 % 500 mL chemo infusion, 180 mg/m2, Intravenous,  Once, 18 of 22 cycles Dose modification: 144 mg/m2 (Cycle 4, Reason: Provider Judgment) Administration: 320 mg (09/03/2017), 320 mg (10/01/2017), 320 mg (10/15/2017), 320 mg (10/29/2017), 320 mg (09/17/2017), 320 mg (03/02/2018), 320 mg (03/16/2018), 320 mg (03/30/2018), 320 mg (04/13/2018), 320 mg (05/13/2018), 320 mg (05/27/2018) leucovorin 888 mg in dextrose 5 % 250 mL infusion, 400 mg/m2, Intravenous,  Once, 18 of 22 cycles Administration: 900 mg (09/03/2017), 900 mg (10/01/2017), 900 mg (10/15/2017), 900 mg (10/29/2017), 900 mg (09/17/2017), 900 mg (03/02/2018), 900 mg (03/16/2018), 900 mg (03/30/2018), 900 mg (04/13/2018), 900 mg (05/13/2018), 900 mg (05/27/2018) fluorouracil (ADRUCIL) chemo injection 900 mg, 400 mg/m2, Intravenous,  Once, 18 of 22 cycles Dose modification: 320 mg/m2 (Cycle 4, Reason: Provider Judgment) Administration: 700 mg (09/03/2017), 700 mg (10/01/2017), 700 mg (10/15/2017), 700 mg (10/29/2017), 700 mg (09/17/2017), 700 mg (03/02/2018), 700 mg (03/16/2018), 700 mg (03/30/2018), 700 mg (04/13/2018), 700 mg (05/13/2018), 700 mg (05/27/2018) fluorouracil  (ADRUCIL) 5,350 mg in sodium chloride 0.9 % 143 mL chemo infusion, 2,400 mg/m2, Intravenous, 1 Day/Dose, 18 of 22 cycles Dose modification: 1,920 mg/m2 (Cycle 4, Reason: Provider Judgment) Administration: 4,250 mg (09/03/2017), 4,250 mg (10/01/2017), 4,250 mg (10/15/2017), 4,250 mg (10/29/2017), 4,250 mg (09/17/2017), 4,250 mg (03/02/2018), 4,250 mg (03/16/2018), 4,250 mg (03/30/2018), 4,250 mg (04/13/2018), 4,250 mg (05/13/2018), 4,250 mg (05/27/2018)  for chemotherapy treatment.     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[place])    07/13/2018 -  Chemotherapy    The patient had palonosetron (ALOXI) injection 0.25 mg, 0.25 mg, Intravenous,  Once, 3 of 4 cycles Administration: 0.25 mg (07/13/2018), 0.25 mg (07/27/2018), 0.25 mg (08/10/2018) leucovorin 864 mg in dextrose 5 % 250 mL infusion, 400 mg/m2 = 864 mg, Intravenous,  Once, 3 of 4 cycles Administration: 864 mg (07/13/2018), 864 mg (07/27/2018), 864 mg (08/10/2018) oxaliplatin (ELOXATIN) 145 mg in dextrose 5 % 500 mL chemo infusion, 68 mg/m2 = 145 mg (80 % of original dose 85 mg/m2), Intravenous,  Once, 3 of 4 cycles Dose modification: 68 mg/m2 (80 % of original dose 85 mg/m2, Cycle 1, Reason: Other (see comments), Comment: Pre-existing neuropathy) Administration: 145 mg (07/13/2018), 145 mg (07/27/2018), 145 mg (08/10/2018) fluorouracil (ADRUCIL) chemo injection 700 mg, 320 mg/m2 = 700 mg (80 % of original dose 400 mg/m2), Intravenous,  Once, 3 of 4 cycles Dose modification: 320 mg/m2 (80 % of original dose 400 mg/m2, Cycle 1, Reason: Provider Judgment) Administration: 700 mg (07/13/2018), 700 mg (07/27/2018), 700 mg (08/10/2018) fluorouracil (ADRUCIL) 4,150 mg in sodium chloride 0.9 % 67 mL chemo infusion, 1,920 mg/m2 = 4,150 mg (80 % of original dose 2,400 mg/m2), Intravenous, 1 Day/Dose,  3 of 4 cycles Dose modification: 1,920 mg/m2 (80 % of original dose 2,400 mg/m2, Cycle 1, Reason: Provider Judgment), 1,920 mg/m2 (original dose  2,400 mg/m2, Cycle 4, Reason: Provider Judgment) Administration: 4,150 mg (07/13/2018), 4,150 mg (08/10/2018), 4,150 mg (07/27/2018)  for chemotherapy treatment.       CANCER STAGING: Cancer Staging Adenocarcinoma of colon metastatic to liver [facility]) Staging form: Colon and Rectum, AJCC 8th Edition - Clinical stage from 07/13/2017: Stage IVB (pM1b) - Signed by Derek Jack, MD on 07/13/2017    INTERVAL HISTORY:  [name] 68 y.o. male returns for routine follow-up and consideration for next cycle of chemotherapy. He is here today alone. He states that the last treatment did him in. He states that he felt terrible after his last treatment and didn't start feeling better until that saturday. Denies any nausea, vomiting, or diarrhea. Denies any new pains. Had not noticed any recent bleeding such as epistaxis, hematuria or hematochezia. Denies recent chest pain on exertion, shortness of breath on minimal exertion, pre-syncopal episodes, or palpitations. Denies any numbness or tingling in hands or feet. Denies any recent fevers, infections, or recent hospitalizations. Patient reports appetite at 100% and energy level at 75%.      REVIEW OF SYSTEMS:  Review of Systems  Neurological: Positive for numbness.  All other systems reviewed and are negative.    PAST MEDICAL/SURGICAL HISTORY:  Past Medical History:  Diagnosis Date  . Colon cancer ([place])   . Diabetes ([place])   . GERD (gastroesophageal reflux disease)   . Hypertension    Past Surgical History:  Procedure Laterality Date  . APPENDECTOMY     during colon surgery  . COLON SURGERY    . IR RADIOLOGIST EVAL & MGMT  11/26/2017  . RADIOFREQUENCY ABLATION N/A 12/10/2017   Procedure: CT MICROWAVE THERMAL ABLATION (LIVER);  Surgeon: Markus Daft, MD;  Location: [facility] ORS;  Service: Anesthesiology;  Laterality: N/A;  . SHOULDER SURGERY  2003     SOCIAL HISTORY:  Social History   Socioeconomic History  . Marital status: Married     Spouse name: Not on file  . Number of children: Not on file  . Years of education: Not on file  . Highest education level: Not on file  Occupational History  . Not on file  Social Needs  . Financial resource strain: Not on file  . Food insecurity:    Worry: Not on file    Inability: Not on file  . Transportation needs:    Medical: Not on file    Non-medical: Not on file  Tobacco Use  . Smoking status: Former Smoker    Last attempt to quit: 04/05/1995    Years since quitting: 23.3  . Smokeless tobacco: Never Used  Substance and Sexual Activity  . Alcohol use: Not Currently    Alcohol/week: 0.0 standard drinks    Comment: used to drink but stopped about 20 years ago  . Drug use: Never  . Sexual activity: Not on file  Lifestyle  . Physical activity:    Days per week: Not on file    Minutes per session: Not on file  . Stress: Not on file  Relationships  . Social connections:    Talks on phone: Not on file    Gets together: Not on file    Attends religious service: Not on file    Active member of club or organization: Not on file    Attends meetings of clubs or organizations: Not on file  Relationship status: Not on file  . Intimate partner violence:    Fear of current or ex partner: Not on file    Emotionally abused: Not on file    Physically abused: Not on file    Forced sexual activity: Not on file  Other Topics Concern  . Not on file  Social History Narrative  . Not on file    FAMILY HISTORY:  Family History  Problem Relation Age of Onset  . Diabetes Mother   . Cancer Father   . Bone cancer Father   . Colon cancer Neg Hx   . Colon polyps Neg Hx     CURRENT MEDICATIONS:  Outpatient Encounter Medications as of 08/10/2018  Medication Sig  . Alpha-Lipoic Acid 200 MG CAPS Take 1 capsule by mouth daily.  . Ascorbic Acid (VITAMIN C) 1000 MG tablet Take 2,000 mg by mouth daily.   . ASHWAGANDHA PO Take 800 mg by mouth daily.  Marland Kitchen aspirin 81 MG tablet Take 81 mg by  mouth daily.  Marland Kitchen atorvastatin (LIPITOR) 10 MG tablet Take 10 mg by mouth daily.   Marland Kitchen BEE POLLEN PO Take 1 capsule by mouth daily.   . Blood Glucose Monitoring Suppl (ONE TOUCH ULTRA MINI) W/DEVICE KIT USE TO CHECK BLOOD SUGAR DAILY.  . cholecalciferol (VITAMIN D) 1000 units tablet Take 5,000 Units by mouth daily.   Marland Kitchen CINNAMON PO Take 2,000 mg by mouth daily.   Marland Kitchen Cod Liver Oil CAPS Take 1 capsule by mouth daily.  . Coenzyme Q10 (CO Q 10) 100 MG CAPS Take 1 capsule by mouth daily.   . Cyanocobalamin (VITAMIN B-12) 5000 MCG TBDP Take 5,000 mcg by mouth daily.  . cyclobenzaprine (FLEXERIL) 10 MG tablet TAKE ONE TABLET BY MOUTH EVERY EVENING AT BEDTIME AS NEEDED FOR MUSCLE SPASMS.  Marland Kitchen diphenoxylate-atropine (LOMOTIL) 2.5-0.025 MG tablet Take 1 tablet by mouth every 6 (six) hours as needed for diarrhea or loose stools (stomach cramps).  . Flax Oil-Fish Oil-Borage Oil (FISH OIL-FLAX OIL-BORAGE OIL) CAPS Take 1 capsule by mouth daily.  . fluorouracil CALGB 16109 in sodium chloride 0.9 % 150 mL Inject into the vein over 96 hr.  . Garlic 6045 MG CAPS Take 2 capsules by mouth daily.   Marland Kitchen glipiZIDE (GLUCOTROL XL) 10 MG 24 hr tablet Take 10 mg by mouth daily.   Marland Kitchen glipiZIDE (GLUCOTROL XL) 5 MG 24 hr tablet TAKE ONE TABLET BY MOUTH DAILY WITH BREAKFAST.  Marland Kitchen Glucosamine-Chondroit-Collagen ([employer] GLUCO-CHONDROIT PLUS UC-II PO) Take 1 tablet by mouth daily.   . IRINOTECAN HCL IV Inject into the vein every 14 (fourteen) days.   Marland Kitchen LECITHIN PO Take 1 capsule by mouth daily. 1200 mg  . LEUCOVORIN CALCIUM IV Inject into the vein every 14 (fourteen) days.   Marland Kitchen lidocaine-prilocaine (EMLA) cream Apply small amount over port site and cover with plastic wrap one hour prior to appointment.  . magnesium oxide (MAG-OX) 400 MG tablet Take 400 mg by mouth daily.   . metFORMIN (GLUCOPHAGE) 500 MG tablet Take 1 tablet (500 mg total) by mouth 2 (two) times daily with a meal. (Patient taking differently: Take 1,000 mg by mouth 2 (two)  times daily with a meal. )  . milk thistle 175 MG tablet Take 175 mg by mouth daily.  Marland Kitchen OVER THE COUNTER MEDICATION Take 1 capsule by mouth daily. Mushroom Complex 750 mg  . OVER THE COUNTER MEDICATION Take 1 capsule by mouth daily. Cura Med 750 mg  . oxyCODONE 10 MG TABS  Take 1 tablet (10 mg total) by mouth 2 (two) times daily as needed for severe pain.  Marland Kitchen oxyCODONE-acetaminophen (PERCOCET/ROXICET) 5-325 MG tablet Take 1 tablet by mouth every 6 (six) hours as needed. for pain  . prochlorperazine (COMPAZINE) 10 MG tablet Take 1 tablet (10 mg total) by mouth every 6 (six) hours as needed for nausea or vomiting.  . valsartan-hydrochlorothiazide (DIOVAN-HCT) 160-25 MG tablet Take 1 tablet by mouth daily.   Facility-Administered Encounter Medications as of 08/10/2018  Medication  . sodium chloride flush (NS) 0.9 % injection 10 mL    ALLERGIES:  No Known Allergies   PHYSICAL EXAM:  ECOG Performance status: 1  Vitals:   08/10/18 0810  BP: 112/65  Pulse: 78  Temp: 98.5 F (36.9 C)  SpO2: 99%   Filed Weights   08/10/18 0810  Weight: 198 lb 12.8 oz (90.2 kg)    Physical Exam Vitals signs reviewed.  Constitutional:      Appearance: Normal appearance.  Cardiovascular:     Rate and Rhythm: Normal rate and regular rhythm.     Heart sounds: Normal heart sounds.  Pulmonary:     Effort: Pulmonary effort is normal.     Breath sounds: Normal breath sounds.  Abdominal:     General: Bowel sounds are normal. There is no distension.     Palpations: Abdomen is soft.  Musculoskeletal:        General: No swelling.  Skin:    General: Skin is warm.  Neurological:     General: No focal deficit present.     Mental Status: He is alert and oriented to person, place, and time.  Psychiatric:        Mood and Affect: Mood normal.        Behavior: Behavior normal.      LABORATORY DATA:  I have reviewed the labs as listed.  CBC    Component Value Date/Time   WBC 6.9 08/10/2018 0803   RBC  4.19 (L) 08/10/2018 0803   HGB 11.8 (L) 08/10/2018 0803   HCT 36.8 (L) 08/10/2018 0803   PLT 167 08/10/2018 0803   MCV 87.8 08/10/2018 0803   MCH 28.2 08/10/2018 0803   MCHC 32.1 08/10/2018 0803   RDW 16.5 (H) 08/10/2018 0803   LYMPHSABS 1.7 08/10/2018 0803   MONOABS 0.7 08/10/2018 0803   EOSABS 0.2 08/10/2018 0803   BASOSABS 0.1 08/10/2018 0803   CMP Latest Ref Rng & Units 08/10/2018 07/27/2018 07/08/2018  Glucose 70 - 99 mg/dL 122(H) 134(H) 173(H)  BUN 8 - 23 mg/dL _0 Creatinine 0.61 - 1.24 mg/dL 0.88 0.88 0.94  Sodium 135 - 145 mmol/L 137 135 137  Potassium 3.5 - 5.1 mmol/L 4.0 3.9 4.6  Chloride 98 - 111 mmol/L 101 101 99  CO2 22 - 32 mmol/L _1 Calcium 8.9 - 10.3 mg/dL 9.5 9.5 10.0  Total Protein 6.5 - 8.1 g/dL 7.5 8.0 8.0  Total Bilirubin 0.3 - 1.2 mg/dL 0.7 0.6 0.4  Alkaline Phos 38 - 126 U/L 148(H) 180(H) 164(H)  AST 15 - 41 U/L _2 ALT 0 - 44 U/L _3 DIAGNOSTIC IMAGING:  I have independently reviewed the scans and discussed with the patient.   I have reviewed Venita Lick LPN's note and agree with the documentation.  I personally performed a face-to-face visit, made revisions and my assessment and plan is as follows.    ASSESSMENT & PLAN:  Adenocarcinoma of colon metastatic to liver ([place]) 1.  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 - He has completed 12 cycles of FOLFIRI on 10/29/2017.  He tolerated it very well.  CT CAP showed stable left upper lobe subcentimeter lung nodule, segment 4 lesion in the liver has decreased to 2.4 cm.  -He underwent left hepatic lobe microwave ablation on 12/10/2017.  He did very well with minimal pain and complications.  He is able to do all his day-to-day activities without any  problems. -CT scan of the abdomen pelvis on 02/05/2018 showed left hepatic lesion with central high attenuation and a large area of low-attenuation.  There is a new left hepatic lobe lesion measuring 1.3 cm.  CT chest showed similar appearing small nodules. - MRI of the liver dated 02/25/2018 shows heterogeneous mass which surrounds the right side of the ablation defect and extends inferiorly in segment 4B measuring 6.9 x 5.6 cm, segment 2 lesion measuring 2.6 cm, segment 8 subcapsular 1.6 cm lesion.  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 - He has at least 3 new lesions and questionable capsular metastasis.  I do not believe he is a candidate for local therapy.  I have recommended going back on chemotherapy.  His last chemo was in July 2019, dose reduced FOLFIRI. - 6 cycles of FOLFIRI from 03/02/2018 through 05/27/2018. - CT CAP on 06/16/2018 shows progression of pulmonary nodules bilaterally, largest left upper lobe pulmonary nodule measuring 1.9 cm and right upper lobe pulmonary nodule 1.8 cm.  Right cardiophrenic angle adenopathy is new at 1.3 cm.  2 liver lesions, largest measuring 7.4 x 6.4 cm, a second largest measuring 5.5 x 4.4 cm.  Both have increased in size.  Development of extensive omental/peritoneal metastasis.  An index right abdominal omental implant measures 2.2 x 3.3 cm.  No bone metastasis. - Cycle 1 FOLFOX was started on 07/13/2018.  He is not a candidate for Avastin because of prior history of bleeding.  He is not a candidate for EGFR antibody.  He reported improvement in abdominal pain after cycle 1. -Cycle 2 was on 07/27/2018.  He felt very tired after last cycle.  Tiredness lasted 5 to 6 days. -He is already receiving chemotherapy at 20% dose reduction. -I would not make any changes at this time.  He will proceed with the same dose level of cycle 3 today. -I will reevaluate him at cycle 4.  If he has severe tiredness will consider  further dose reduction. -He has seen Dr. Leslie Andrea at [facility].  2.  Peripheral neuropathy: -This is likely from diabetes.  He has mild numbness in the feet predominantly at bedtime. - This is stable since the start of FOLFOX.  3.  Hypertension: -This is well controlled on valsartan HCTZ 160s-25 mg daily.       Orders placed this encounter:  No orders of the defined types were placed in this encounter.     Derek Jack, MD [place] [phone]

## 2018-08-10 NOTE — Assessment & Plan Note (Addendum)
1.  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 - He has completed 12 cycles of FOLFIRI on 10/29/2017.  He tolerated it very well.  CT CAP showed stable left upper lobe subcentimeter lung nodule, segment 4 lesion in the liver has decreased to 2.4 cm.  -He underwent left hepatic lobe microwave ablation on 12/10/2017.  He did very well with minimal pain and complications.  He is able to do all his day-to-day activities without any problems. -CT scan of the abdomen pelvis on 02/05/2018 showed left hepatic lesion with central high attenuation and a large area of low-attenuation.  There is a new left hepatic lobe lesion measuring 1.3 cm.  CT chest showed similar appearing small nodules. - MRI of the liver dated 02/25/2018 shows heterogeneous mass which surrounds the right side of the ablation defect and extends inferiorly in segment 4B measuring 6.9 x 5.6 cm, segment 2 lesion measuring 2.6 cm, segment 8 subcapsular 1.6 cm lesion.  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 - He has at least 3 new lesions and questionable capsular metastasis.  I do not believe he is a candidate for local therapy.  I have recommended going back on chemotherapy.  His last chemo was in July 2019, dose reduced FOLFIRI. - 6 cycles of FOLFIRI from 03/02/2018 through 05/27/2018. - CT CAP on 06/16/2018 shows progression of pulmonary nodules bilaterally, largest left upper lobe pulmonary nodule measuring 1.9 cm and right upper lobe pulmonary nodule 1.8 cm.  Right cardiophrenic angle adenopathy is new at 1.3 cm.  2 liver lesions, largest measuring 7.4 x 6.4 cm, a second largest measuring 5.5 x 4.4 cm.  Both have increased in size.  Development of extensive omental/peritoneal  metastasis.  An index right abdominal omental implant measures 2.2 x 3.3 cm.  No bone metastasis. - Cycle 1 FOLFOX was started on 07/13/2018.  He is not a candidate for Avastin because of prior history of bleeding.  He is not a candidate for EGFR antibody.  He reported improvement in abdominal pain after cycle 1. -Cycle 2 was on 07/27/2018.  He felt very tired after last cycle.  Tiredness lasted 5 to 6 days. -He is already receiving chemotherapy at 20% dose reduction. -I would not make any changes at this time.  He will proceed with the same dose level of cycle 3 today. -I will reevaluate him at cycle 4.  If he has severe tiredness will consider further dose reduction. -He has seen Dr. Leslie Andrea at [facility].  2.  Peripheral neuropathy: -This is likely from diabetes.  He has mild numbness in the feet predominantly at bedtime. - This is stable since the start of FOLFOX.  3.  Hypertension: -This is well controlled on valsartan HCTZ 160s-25 mg daily.

## 2018-08-10 NOTE — Patient Instructions (Signed)
[facility] at [facility] Discharge Instructions  You were seen today by Dr. Katragadda. He went over your recent lab results. He will see you back in 2 weeks for labs, treatment and follow up.   Thank you for choosing [facility] at [facility] to provide your oncology and hematology care.  To afford each patient quality time with our provider, please arrive at least 15 minutes before your scheduled appointment time.   If you have a lab appointment with the Cancer Center please come in thru the  Main Entrance and check in at the main information desk  You need to re-schedule your appointment should you arrive 10 or more minutes late.  We strive to give you quality time with our providers, and arriving late affects you and other patients whose appointments are after yours.  Also, if you no show three or more times for appointments you may be dismissed from the clinic at the providers discretion.     Again, thank you for choosing [facility].  Our hope is that these requests will decrease the amount of time that you wait before being seen by our physicians.       _____________________________________________________________  Should you have questions after your visit to [facility], please contact our office at [phone] between the hours of 8:00 a.m. and 4:30 p.m.  Voicemails left after 4:00 p.m. will not be returned until the following business day.  For prescription refill requests, have your pharmacy contact our office and allow 72 hours.    Cancer Center Support Programs:   > Cancer Support Group  2nd Tuesday of the month 1pm-2pm, Journey Room    

## 2018-08-11 ENCOUNTER — Other Ambulatory Visit: Payer: Self-pay

## 2018-08-12 ENCOUNTER — Inpatient Hospital Stay (HOSPITAL_COMMUNITY): Payer: Medicare PPO

## 2018-08-12 VITALS — BP 118/73 | HR 68 | Temp 98.0°F | Resp 18

## 2018-08-12 DIAGNOSIS — C187 Malignant neoplasm of sigmoid colon: Secondary | ICD-10-CM | POA: Diagnosis not present

## 2018-08-12 DIAGNOSIS — Z452 Encounter for adjustment and management of vascular access device: Secondary | ICD-10-CM | POA: Diagnosis not present

## 2018-08-12 DIAGNOSIS — C189 Malignant neoplasm of colon, unspecified: Secondary | ICD-10-CM

## 2018-08-12 DIAGNOSIS — C787 Secondary malignant neoplasm of liver and intrahepatic bile duct: Principal | ICD-10-CM

## 2018-08-12 DIAGNOSIS — Z5111 Encounter for antineoplastic chemotherapy: Secondary | ICD-10-CM | POA: Diagnosis not present

## 2018-08-12 DIAGNOSIS — C7800 Secondary malignant neoplasm of unspecified lung: Secondary | ICD-10-CM | POA: Diagnosis not present

## 2018-08-12 MED ORDER — SODIUM CHLORIDE 0.9% FLUSH
10.0000 mL | INTRAVENOUS | Status: DC | PRN
  Administered 2018-08-12: 10 mL
  Filled 2018-08-12: qty 10

## 2018-08-12 MED ORDER — HEPARIN SOD (PORK) LOCK FLUSH 100 UNIT/ML IV SOLN
500.0000 [IU] | Freq: Once | INTRAVENOUS | Status: AC | PRN
  Administered 2018-08-12: 500 [IU]

## 2018-08-12 NOTE — Progress Notes (Signed)
Patients chemotherapy pump disconnected with no complaints of pain.  Good blood return noted.  Site clean and dry with no bruising or swelling noted.  Band aid applied.  VSS with discharge and left ambulatory with no s/s of distress noted.

## 2018-08-24 ENCOUNTER — Other Ambulatory Visit: Payer: Self-pay

## 2018-08-24 ENCOUNTER — Inpatient Hospital Stay (HOSPITAL_COMMUNITY): Payer: Medicare PPO

## 2018-08-24 DIAGNOSIS — C787 Secondary malignant neoplasm of liver and intrahepatic bile duct: Principal | ICD-10-CM

## 2018-08-24 DIAGNOSIS — Z5111 Encounter for antineoplastic chemotherapy: Secondary | ICD-10-CM | POA: Diagnosis not present

## 2018-08-24 DIAGNOSIS — C7800 Secondary malignant neoplasm of unspecified lung: Secondary | ICD-10-CM | POA: Diagnosis not present

## 2018-08-24 DIAGNOSIS — C189 Malignant neoplasm of colon, unspecified: Secondary | ICD-10-CM

## 2018-08-24 DIAGNOSIS — C187 Malignant neoplasm of sigmoid colon: Secondary | ICD-10-CM | POA: Diagnosis not present

## 2018-08-24 DIAGNOSIS — Z452 Encounter for adjustment and management of vascular access device: Secondary | ICD-10-CM | POA: Diagnosis not present

## 2018-08-24 LAB — CBC WITH DIFFERENTIAL/PLATELET
Abs Immature Granulocytes: 0.01 10*3/uL (ref 0.00–0.07)
Basophils Absolute: 0.1 10*3/uL (ref 0.0–0.1)
Basophils Relative: 1 %
Eosinophils Absolute: 0.1 10*3/uL (ref 0.0–0.5)
Eosinophils Relative: 2 %
HCT: 39.1 % (ref 39.0–52.0)
Hemoglobin: 12.4 g/dL — ABNORMAL LOW (ref 13.0–17.0)
Immature Granulocytes: 0 %
Lymphocytes Relative: 29 %
Lymphs Abs: 1.6 10*3/uL (ref 0.7–4.0)
MCH: 28.7 pg (ref 26.0–34.0)
MCHC: 31.7 g/dL (ref 30.0–36.0)
MCV: 90.5 fL (ref 80.0–100.0)
Monocytes Absolute: 0.6 10*3/uL (ref 0.1–1.0)
Monocytes Relative: 10 %
Neutro Abs: 3.2 10*3/uL (ref 1.7–7.7)
Neutrophils Relative %: 58 %
Platelets: 125 10*3/uL — ABNORMAL LOW (ref 150–400)
RBC: 4.32 MIL/uL (ref 4.22–5.81)
RDW: 17.8 % — ABNORMAL HIGH (ref 11.5–15.5)
WBC: 5.6 10*3/uL (ref 4.0–10.5)
nRBC: 0 % (ref 0.0–0.2)

## 2018-08-24 LAB — COMPREHENSIVE METABOLIC PANEL
ALT: 20 U/L (ref 0–44)
AST: 27 U/L (ref 15–41)
Albumin: 3.9 g/dL (ref 3.5–5.0)
Alkaline Phosphatase: 143 U/L — ABNORMAL HIGH (ref 38–126)
Anion gap: 10 (ref 5–15)
BUN: 13 mg/dL (ref 8–23)
CO2: 27 mmol/L (ref 22–32)
Calcium: 9.5 mg/dL (ref 8.9–10.3)
Chloride: 102 mmol/L (ref 98–111)
Creatinine, Ser: 0.98 mg/dL (ref 0.61–1.24)
GFR calc Af Amer: >60 mL/min (ref >60)
GFR calc non Af Amer: >60 mL/min (ref >60)
Glucose, Bld: 138 mg/dL — ABNORMAL HIGH (ref 70–99)
Potassium: 3.9 mmol/L (ref 3.5–5.1)
Sodium: 139 mmol/L (ref 135–145)
Total Bilirubin: 0.6 mg/dL (ref 0.3–1.2)
Total Protein: 7.6 g/dL (ref 6.5–8.1)

## 2018-08-24 LAB — MAGNESIUM: Magnesium: 1.8 mg/dL (ref 1.7–2.4)

## 2018-08-25 ENCOUNTER — Other Ambulatory Visit (HOSPITAL_COMMUNITY): Payer: Medicare PPO

## 2018-08-25 ENCOUNTER — Inpatient Hospital Stay (HOSPITAL_COMMUNITY): Payer: Medicare PPO

## 2018-08-25 ENCOUNTER — Inpatient Hospital Stay (HOSPITAL_BASED_OUTPATIENT_CLINIC_OR_DEPARTMENT_OTHER): Payer: Medicare PPO | Admitting: Hematology

## 2018-08-25 ENCOUNTER — Other Ambulatory Visit: Payer: Self-pay

## 2018-08-25 ENCOUNTER — Encounter (HOSPITAL_COMMUNITY): Payer: Self-pay | Admitting: Hematology

## 2018-08-25 VITALS — BP 119/77 | HR 62 | Temp 98.1°F | Resp 18

## 2018-08-25 DIAGNOSIS — I10 Essential (primary) hypertension: Secondary | ICD-10-CM

## 2018-08-25 DIAGNOSIS — C7800 Secondary malignant neoplasm of unspecified lung: Secondary | ICD-10-CM

## 2018-08-25 DIAGNOSIS — C787 Secondary malignant neoplasm of liver and intrahepatic bile duct: Secondary | ICD-10-CM | POA: Diagnosis not present

## 2018-08-25 DIAGNOSIS — Z87891 Personal history of nicotine dependence: Secondary | ICD-10-CM

## 2018-08-25 DIAGNOSIS — Z452 Encounter for adjustment and management of vascular access device: Secondary | ICD-10-CM | POA: Diagnosis not present

## 2018-08-25 DIAGNOSIS — C187 Malignant neoplasm of sigmoid colon: Secondary | ICD-10-CM | POA: Diagnosis not present

## 2018-08-25 DIAGNOSIS — C189 Malignant neoplasm of colon, unspecified: Secondary | ICD-10-CM

## 2018-08-25 DIAGNOSIS — Z5111 Encounter for antineoplastic chemotherapy: Secondary | ICD-10-CM | POA: Diagnosis not present

## 2018-08-25 DIAGNOSIS — E1142 Type 2 diabetes mellitus with diabetic polyneuropathy: Secondary | ICD-10-CM

## 2018-08-25 LAB — CEA: CEA: 180 ng/mL — ABNORMAL HIGH (ref 0.0–4.7)

## 2018-08-25 MED ORDER — PALONOSETRON HCL INJECTION 0.25 MG/5ML
0.2500 mg | Freq: Once | INTRAVENOUS | Status: AC
  Administered 2018-08-25: 0.25 mg via INTRAVENOUS
  Filled 2018-08-25: qty 5

## 2018-08-25 MED ORDER — DEXTROSE 5 % IV SOLN
Freq: Once | INTRAVENOUS | Status: AC
  Administered 2018-08-25: 10:00:00 via INTRAVENOUS

## 2018-08-25 MED ORDER — FLUOROURACIL CHEMO INJECTION 2.5 GM/50ML
320.0000 mg/m2 | Freq: Once | INTRAVENOUS | Status: AC
  Administered 2018-08-25: 700 mg via INTRAVENOUS
  Filled 2018-08-25: qty 14

## 2018-08-25 MED ORDER — LEUCOVORIN CALCIUM INJECTION 350 MG
400.0000 mg/m2 | Freq: Once | INTRAVENOUS | Status: AC
  Administered 2018-08-25: 864 mg via INTRAVENOUS
  Filled 2018-08-25: qty 43.2

## 2018-08-25 MED ORDER — OXALIPLATIN CHEMO INJECTION 100 MG/20ML
68.0000 mg/m2 | Freq: Once | INTRAVENOUS | Status: AC
  Administered 2018-08-25: 145 mg via INTRAVENOUS
  Filled 2018-08-25: qty 9

## 2018-08-25 MED ORDER — SODIUM CHLORIDE 0.9 % IV SOLN
1920.0000 mg/m2 | INTRAVENOUS | Status: DC
  Administered 2018-08-25: 4150 mg via INTRAVENOUS
  Filled 2018-08-25: qty 83

## 2018-08-25 MED ORDER — SODIUM CHLORIDE 0.9 % IV SOLN
10.0000 mg | Freq: Once | INTRAVENOUS | Status: AC
  Administered 2018-08-25: 10 mg via INTRAVENOUS
  Filled 2018-08-25: qty 10

## 2018-08-25 MED ORDER — SODIUM CHLORIDE 0.9% FLUSH
10.0000 mL | INTRAVENOUS | Status: DC | PRN
  Administered 2018-08-25: 10 mL
  Filled 2018-08-25: qty 10

## 2018-08-25 NOTE — Patient Instructions (Signed)
[facility] at [facility] Discharge Instructions  You were seen today by Dr. Katragadda. He went over your recent lab results. He will see you back in 2 weeks for labs and follow up.   Thank you for choosing [facility] at [facility] to provide your oncology and hematology care.  To afford each patient quality time with our provider, please arrive at least 15 minutes before your scheduled appointment time.   If you have a lab appointment with the Cancer Center please come in thru the  Main Entrance and check in at the main information desk  You need to re-schedule your appointment should you arrive 10 or more minutes late.  We strive to give you quality time with our providers, and arriving late affects you and other patients whose appointments are after yours.  Also, if you no show three or more times for appointments you may be dismissed from the clinic at the providers discretion.     Again, thank you for choosing [facility].  Our hope is that these requests will decrease the amount of time that you wait before being seen by our physicians.       _____________________________________________________________  Should you have questions after your visit to [facility], please contact our office at [phone] between the hours of 8:00 a.m. and 4:30 p.m.  Voicemails left after 4:00 p.m. will not be returned until the following business day.  For prescription refill requests, have your pharmacy contact our office and allow 72 hours.    Cancer Center Support Programs:   > Cancer Support Group  2nd Tuesday of the month 1pm-2pm, Journey Room    

## 2018-08-25 NOTE — Assessment & Plan Note (Addendum)
1.  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 - He has completed 12 cycles of FOLFIRI on 10/29/2017.  He tolerated it very well.  CT CAP showed stable left upper lobe subcentimeter lung nodule, segment 4 lesion in the liver has decreased to 2.4 cm.  -He underwent left hepatic lobe microwave ablation on 12/10/2017.  He did very well with minimal pain and complications.  He is able to do all his day-to-day activities without any problems. -CT scan of the abdomen pelvis on 02/05/2018 showed left hepatic lesion with central high attenuation and a large area of low-attenuation.  There is a new left hepatic lobe lesion measuring 1.3 cm.  CT chest showed similar appearing small nodules. - MRI of the liver dated 02/25/2018 shows heterogeneous mass which surrounds the right side of the ablation defect and extends inferiorly in segment 4B measuring 6.9 x 5.6 cm, segment 2 lesion measuring 2.6 cm, segment 8 subcapsular 1.6 cm lesion.  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 - He has at least 3 new lesions and questionable capsular metastasis.  I do not believe he is a candidate for local therapy.  I have recommended going back on chemotherapy.  His last chemo was in July 2019, dose reduced FOLFIRI. - 6 cycles of FOLFIRI from 03/02/2018 through 05/27/2018. - CT CAP on 06/16/2018 shows progression of pulmonary nodules bilaterally, largest left upper lobe pulmonary nodule measuring 1.9 cm and right upper lobe pulmonary nodule 1.8 cm.  Right cardiophrenic angle adenopathy is new at 1.3 cm.  2 liver lesions, largest measuring 7.4 x 6.4 cm, a second largest measuring 5.5 x 4.4 cm.  Both have increased in size.  Development of extensive omental/peritoneal  metastasis.  An index right abdominal omental implant measures 2.2 x 3.3 cm.  No bone metastasis. - 3 cycles of FOLFOX (20% dose reduction) from 07/13/2018 through 08/10/2018. -He is not a candidate for Avastin because of prior history of bleeding.  He is not a candidate for EGFR antibody. -His pain in the right upper quadrant has improved completely after cycle 1. -He had some mild tiredness but denies any major GI side effects from last cycle. -I have reviewed his blood work.  His CEA level has gone up to 190.  He may proceed with cycle 4 today. -I plan to see him back in 2 weeks.  I will plan to rescan him after 6 cycles.  2.  Peripheral neuropathy: -This is likely from diabetes.  He has mild numbness in the feet predominantly at bedtime even prior to start of therapy. -This has not gotten any worse since the start of FOLFOX.  3.  Hypertension: -Blood pressure today is 117/75.  He will continue valsartan HCTZ.

## 2018-08-25 NOTE — Progress Notes (Signed)
Christopher Friedman, [place] [address]   CLINIC:  Medical Oncology/Hematology  PCP:  Christopher Bis, MD [place] [address] [phone]   REASON FOR VISIT:  Follow-up for metastatic colon cancer.   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 ([place])   05/28/2017 - 06/09/2018 Chemotherapy    The patient had palonosetron (ALOXI) injection 0.25 mg, 0.25 mg, Intravenous,  Once, 18 of 22 cycles Administration: 0.25 mg (09/03/2017), 0.25 mg (10/01/2017), 0.25 mg (10/15/2017), 0.25 mg (10/29/2017), 0.25 mg (09/17/2017), 0.25 mg (03/02/2018), 0.25 mg (03/16/2018), 0.25 mg (03/30/2018), 0.25 mg (04/13/2018), 0.25 mg (05/13/2018), 0.25 mg (05/27/2018) irinotecan (CAMPTOSAR) 400 mg in dextrose 5 % 500 mL chemo infusion, 180 mg/m2, Intravenous,  Once, 18 of 22 cycles Dose modification: 144 mg/m2 (Cycle 4, Reason: Provider Judgment) Administration: 320 mg (09/03/2017), 320 mg (10/01/2017), 320 mg (10/15/2017), 320 mg (10/29/2017), 320 mg (09/17/2017), 320 mg (03/02/2018), 320 mg (03/16/2018), 320 mg (03/30/2018), 320 mg (04/13/2018), 320 mg (05/13/2018), 320 mg (05/27/2018) leucovorin 888 mg in dextrose 5 % 250 mL infusion, 400 mg/m2, Intravenous,  Once, 18 of 22 cycles Administration: 900 mg (09/03/2017), 900 mg (10/01/2017), 900 mg (10/15/2017), 900 mg (10/29/2017), 900 mg (09/17/2017), 900 mg (03/02/2018), 900 mg (03/16/2018), 900 mg (03/30/2018), 900 mg (04/13/2018), 900 mg (05/13/2018), 900 mg (05/27/2018) fluorouracil (ADRUCIL) chemo injection 900 mg, 400 mg/m2, Intravenous,  Once, 18 of 22 cycles Dose modification: 320 mg/m2 (Cycle 4, Reason: Provider Judgment) Administration: 700 mg (09/03/2017), 700 mg (10/01/2017), 700 mg (10/15/2017), 700 mg (10/29/2017), 700 mg (09/17/2017), 700 mg (03/02/2018), 700 mg (03/16/2018), 700 mg (03/30/2018), 700 mg (04/13/2018), 700 mg (05/13/2018), 700 mg (05/27/2018) fluorouracil (ADRUCIL) 5,350 mg in sodium chloride 0.9 % 143 mL chemo  infusion, 2,400 mg/m2, Intravenous, 1 Day/Dose, 18 of 22 cycles Dose modification: 1,920 mg/m2 (Cycle 4, Reason: Provider Judgment) Administration: 4,250 mg (09/03/2017), 4,250 mg (10/01/2017), 4,250 mg (10/15/2017), 4,250 mg (10/29/2017), 4,250 mg (09/17/2017), 4,250 mg (03/02/2018), 4,250 mg (03/16/2018), 4,250 mg (03/30/2018), 4,250 mg (04/13/2018), 4,250 mg (05/13/2018), 4,250 mg (05/27/2018)  for chemotherapy treatment.     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[place])    07/13/2018 -  Chemotherapy    The patient had palonosetron (ALOXI) injection 0.25 mg, 0.25 mg, Intravenous,  Once, 4 of 6 cycles Administration: 0.25 mg (07/13/2018), 0.25 mg (07/27/2018), 0.25 mg (08/10/2018), 0.25 mg (08/25/2018) leucovorin 864 mg in dextrose 5 % 250 mL infusion, 400 mg/m2 = 864 mg, Intravenous,  Once, 4 of 6 cycles Administration: 864 mg (07/13/2018), 864 mg (07/27/2018), 864 mg (08/10/2018), 864 mg (08/25/2018) oxaliplatin (ELOXATIN) 145 mg in dextrose 5 % 500 mL chemo infusion, 68 mg/m2 = 145 mg (80 % of original dose 85 mg/m2), Intravenous,  Once, 4 of 6 cycles Dose modification: 68 mg/m2 (80 % of original dose 85 mg/m2, Cycle 1, Reason: Other (see comments), Comment: Pre-existing neuropathy) Administration: 145 mg (07/13/2018), 145 mg (07/27/2018), 145 mg (08/10/2018), 145 mg (08/25/2018) fluorouracil (ADRUCIL) chemo injection 700 mg, 320 mg/m2 = 700 mg (80 % of original dose 400 mg/m2), Intravenous,  Once, 4 of 6 cycles Dose modification: 320 mg/m2 (80 % of original dose 400 mg/m2, Cycle 1, Reason: Provider Judgment) Administration: 700 mg (07/13/2018), 700 mg (07/27/2018), 700 mg (08/10/2018), 700 mg (08/25/2018) fluorouracil (ADRUCIL) 4,150 mg in sodium chloride 0.9 % 67 mL chemo infusion, 1,920 mg/m2 = 4,150 mg (80 % of original dose 2,400 mg/m2), Intravenous,  1 Day/Dose, 4 of 6 cycles Dose modification: 1,920 mg/m2 (80 % of original dose 2,400 mg/m2, Cycle 1, Reason: Provider Judgment),  1,920 mg/m2 (original dose 2,400 mg/m2, Cycle 4, Reason: Provider Judgment) Administration: 4,150 mg (07/13/2018), 4,150 mg (08/10/2018), 4,150 mg (07/27/2018), 4,150 mg (08/25/2018)  for chemotherapy treatment.       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 male returns for routine follow-up and consideration for next cycle of chemotherapy. He is here today alone. He states that he did much better with this last treatment. He states that he has felt good since his last treatment with no new or worsening side effects. He states that he has been eating well. Denies any nausea, vomiting, or diarrhea. Denies any new pains. Had not noticed any recent bleeding such as epistaxis, hematuria or hematochezia. Denies recent chest pain on exertion, shortness of breath on minimal exertion, pre-syncopal episodes, or palpitations. Denies any numbness or tingling in hands or feet. Denies any recent fevers, infections, or recent hospitalizations. Patient reports appetite at 100% and energy level at 100%.      REVIEW OF SYSTEMS:  Review of Systems  Constitutional: Positive for fatigue.  Neurological: Positive for numbness.  All other systems reviewed and are negative.    PAST MEDICAL/SURGICAL HISTORY:  Past Medical History:  Diagnosis Date  . Colon cancer ([place])   . Diabetes ([place])   . GERD (gastroesophageal reflux disease)   . Hypertension    Past Surgical History:  Procedure Laterality Date  . APPENDECTOMY     during colon surgery  . COLON SURGERY    . IR RADIOLOGIST EVAL & MGMT  11/26/2017  . RADIOFREQUENCY ABLATION N/A 12/10/2017   Procedure: CT MICROWAVE THERMAL ABLATION (LIVER);  Surgeon: Markus Daft, MD;  Location: [facility];  Service: Anesthesiology;  Laterality: N/A;   . SHOULDER SURGERY  2003     SOCIAL HISTORY:  Social History   Socioeconomic History  . Marital status: Married    Spouse name: Not on file  . Number of children: Not on file  . Years of education: Not on file  . Highest education level: Not on file  Occupational History  . Not on file  Social Needs  . Financial resource strain: Not on file  . Food insecurity:    Worry: Not on file    Inability: Not on file  . Transportation needs:    Medical: Not on file    Non-medical: Not on file  Tobacco Use  . Smoking status: Former Smoker    Last attempt to quit: 04/05/1995    Years since quitting: 23.4  . Smokeless tobacco: Never Used  Substance and Sexual Activity  . Alcohol use: Not Currently    Alcohol/week: 0.0 standard drinks    Comment: used to drink but stopped about 20 years ago  . Drug use: Never  . Sexual activity: Not on file  Lifestyle  . Physical activity:    Days per week: Not on file    Minutes per session: Not on file  . Stress: Not on file  Relationships  . Social connections:    Talks on phone: Not on file    Gets together: Not on file    Attends religious service: Not on file    Active  member of club or organization: Not on file    Attends meetings of clubs or organizations: Not on file    Relationship status: Not on file  . Intimate partner violence:    Fear of current or ex partner: Not on file    Emotionally abused: Not on file    Physically abused: Not on file    Forced sexual activity: Not on file  Other Topics Concern  . Not on file  Social History Narrative  . Not on file    FAMILY HISTORY:  Family History  Problem Relation Age of Onset  . Diabetes Mother   . Cancer Father   . Bone cancer Father   . Colon cancer Neg Hx   . Colon polyps Neg Hx     CURRENT MEDICATIONS:  Outpatient Encounter Medications as of 08/25/2018  Medication Sig  . Alpha-Lipoic Acid 200 MG CAPS Take 1 capsule by mouth daily.  . Ascorbic Acid (VITAMIN C) 1000  MG tablet Take 2,000 mg by mouth daily.   . ASHWAGANDHA PO Take 800 mg by mouth daily.  Marland Kitchen aspirin 81 MG tablet Take 81 mg by mouth daily.  Marland Kitchen atorvastatin (LIPITOR) 10 MG tablet Take 10 mg by mouth daily.   Marland Kitchen BEE POLLEN PO Take 1 capsule by mouth daily.   . Blood Glucose Monitoring Suppl (ONE TOUCH ULTRA MINI) W/DEVICE KIT USE TO CHECK BLOOD SUGAR DAILY.  . cholecalciferol (VITAMIN D) 1000 units tablet Take 5,000 Units by mouth daily.   Marland Kitchen CINNAMON PO Take 2,000 mg by mouth daily.   Marland Kitchen Cod Liver Oil CAPS Take 1 capsule by mouth daily.  . Coenzyme Q10 (CO Q 10) 100 MG CAPS Take 1 capsule by mouth daily.   . Cyanocobalamin (VITAMIN B-12) 5000 MCG TBDP Take 5,000 mcg by mouth daily.  . cyclobenzaprine (FLEXERIL) 10 MG tablet TAKE ONE TABLET BY MOUTH EVERY EVENING AT BEDTIME AS NEEDED FOR MUSCLE SPASMS.  Marland Kitchen diphenoxylate-atropine (LOMOTIL) 2.5-0.025 MG tablet Take 1 tablet by mouth every 6 (six) hours as needed for diarrhea or loose stools (stomach cramps).  . Flax Oil-Fish Oil-Borage Oil (FISH OIL-FLAX OIL-BORAGE OIL) CAPS Take 1 capsule by mouth daily.  . fluorouracil CALGB 16109 in sodium chloride 0.9 % 150 mL Inject into the vein over 96 hr.  . Garlic 6045 MG CAPS Take 2 capsules by mouth daily.   Marland Kitchen glipiZIDE (GLUCOTROL XL) 10 MG 24 hr tablet Take 10 mg by mouth daily.   . Glucosamine-Chondroit-Collagen ([employer] GLUCO-CHONDROIT PLUS UC-II PO) Take 1 tablet by mouth daily.   . IRINOTECAN HCL IV Inject into the vein every 14 (fourteen) days.   Marland Kitchen LECITHIN PO Take 1 capsule by mouth daily. 1200 mg  . LEUCOVORIN CALCIUM IV Inject into the vein every 14 (fourteen) days.   Marland Kitchen lidocaine-prilocaine (EMLA) cream Apply small amount over port site and cover with plastic wrap one hour prior to appointment.  . magnesium oxide (MAG-OX) 400 MG tablet Take 400 mg by mouth daily.   . metFORMIN (GLUCOPHAGE) 500 MG tablet Take 1 tablet (500 mg total) by mouth 2 (two) times daily with a meal. (Patient taking differently:  Take 1,000 mg by mouth 2 (two) times daily with a meal. )  . milk thistle 175 MG tablet Take 175 mg by mouth daily.  Marland Kitchen OVER THE COUNTER MEDICATION Take 1 capsule by mouth daily. Mushroom Complex 750 mg  . OVER THE COUNTER MEDICATION Take 1 capsule by mouth daily. Cura Med 750 mg  .  prochlorperazine (COMPAZINE) 10 MG tablet Take 1 tablet (10 mg total) by mouth every 6 (six) hours as needed for nausea or vomiting.  . valsartan-hydrochlorothiazide (DIOVAN-HCT) 160-25 MG tablet Take 1 tablet by mouth daily.  Marland Kitchen oxyCODONE 10 MG TABS Take 1 tablet (10 mg total) by mouth 2 (two) times daily as needed for severe pain. (Patient not taking: Reported on 08/25/2018)  . [DISCONTINUED] glipiZIDE (GLUCOTROL XL) 5 MG 24 hr tablet TAKE ONE TABLET BY MOUTH DAILY WITH BREAKFAST.  . [DISCONTINUED] oxyCODONE-acetaminophen (PERCOCET/ROXICET) 5-325 MG tablet Take 1 tablet by mouth every 6 (six) hours as needed. for pain   Facility-Administered Encounter Medications as of 08/25/2018  Medication  . sodium chloride flush (NS) 0.9 % injection 10 mL    ALLERGIES:  No Known Allergies   PHYSICAL EXAM:  ECOG Performance status: 1  Vitals:   08/25/18 0900  BP: 117/75  Pulse: 71  Resp: 20  Temp: 97.7 F (36.5 C)  SpO2: 100%   Filed Weights   08/25/18 0900  Weight: 198 lb 8 oz (90 kg)    Physical Exam Vitals signs reviewed.  Constitutional:      Appearance: Normal appearance.  Cardiovascular:     Rate and Rhythm: Normal rate and regular rhythm.     Heart sounds: Normal heart sounds.  Pulmonary:     Effort: Pulmonary effort is normal.     Breath sounds: Normal breath sounds.  Abdominal:     General: There is no distension.     Palpations: Abdomen is soft. There is no mass.  Musculoskeletal:        General: No swelling.  Skin:    General: Skin is warm.  Neurological:     Mental Status: He is alert and oriented to person, place, and time.  Psychiatric:        Mood and Affect: Mood normal.         Behavior: Behavior normal.      LABORATORY DATA:  I have reviewed the labs as listed.  CBC    Component Value Date/Time   WBC 5.6 08/24/2018 0836   RBC 4.32 08/24/2018 0836   HGB 12.4 (L) 08/24/2018 0836   HCT 39.1 08/24/2018 0836   PLT 125 (L) 08/24/2018 0836   MCV 90.5 08/24/2018 0836   MCH 28.7 08/24/2018 0836   MCHC 31.7 08/24/2018 0836   RDW 17.8 (H) 08/24/2018 0836   LYMPHSABS 1.6 08/24/2018 0836   MONOABS 0.6 08/24/2018 0836   EOSABS 0.1 08/24/2018 0836   BASOSABS 0.1 08/24/2018 0836   CMP Latest Ref Rng & Units 08/24/2018 08/10/2018 07/27/2018  Glucose 70 - 99 mg/dL 138(H) 122(H) 134(H)  BUN 8 - 23 mg/dL '13 14 18  ' Creatinine 0.61 - 1.24 mg/dL 0.98 0.88 0.88  Sodium 135 - 145 mmol/L 139 137 135  Potassium 3.5 - 5.1 mmol/L 3.9 4.0 3.9  Chloride 98 - 111 mmol/L 102 101 101  CO2 22 - 32 mmol/L '27 26 25  ' Calcium 8.9 - 10.3 mg/dL 9.5 9.5 9.5  Total Protein 6.5 - 8.1 g/dL 7.6 7.5 8.0  Total Bilirubin 0.3 - 1.2 mg/dL 0.6 0.7 0.6  Alkaline Phos 38 - 126 U/L 143(H) 148(H) 180(H)  AST 15 - 41 U/L '27 28 26  ' ALT 0 - 44 U/L '20 16 17       ' DIAGNOSTIC IMAGING:  I have independently reviewed the scans and discussed with the patient.   I have reviewed Venita Lick LPN's note and agree with the documentation.  I personally performed a face-to-face visit, made revisions and my assessment and plan is as follows.    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 - He has completed 12 cycles of FOLFIRI on 10/29/2017.  He tolerated it very well.  CT CAP showed stable left upper lobe subcentimeter lung nodule, segment 4 lesion in the liver has decreased to 2.4 cm.  -He underwent left  hepatic lobe microwave ablation on 12/10/2017.  He did very well with minimal pain and complications.  He is able to do all his day-to-day activities without any problems. -CT scan of the abdomen pelvis on 02/05/2018 showed left hepatic lesion with central high attenuation and a large area of low-attenuation.  There is a new left hepatic lobe lesion measuring 1.3 cm.  CT chest showed similar appearing small nodules. - MRI of the liver dated 02/25/2018 shows heterogeneous mass which surrounds the right side of the ablation defect and extends inferiorly in segment 4B measuring 6.9 x 5.6 cm, segment 2 lesion measuring 2.6 cm, segment 8 subcapsular 1.6 cm lesion.  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 - He has at least 3 new lesions and questionable capsular metastasis.  I do not believe he is a candidate for local therapy.  I have recommended going back on chemotherapy.  His last chemo was in July 2019, dose reduced FOLFIRI. - 6 cycles of FOLFIRI from 03/02/2018 through 05/27/2018. - CT CAP on 06/16/2018 shows progression of pulmonary nodules bilaterally, largest left upper lobe pulmonary nodule measuring 1.9 cm and right upper lobe pulmonary nodule 1.8 cm.  Right cardiophrenic angle adenopathy is new at 1.3 cm.  2 liver lesions, largest measuring 7.4 x 6.4 cm, a second largest measuring 5.5 x 4.4 cm.  Both have increased in size.  Development of extensive omental/peritoneal metastasis.  An index right abdominal omental implant measures 2.2 x 3.3 cm.  No bone metastasis. - 3 cycles of FOLFOX (20% dose reduction) from 07/13/2018 through 08/10/2018. -He is not a candidate for Avastin because of prior history of bleeding.  He is not a candidate for EGFR antibody. -His pain in the right upper quadrant has improved completely after cycle 1. -He had some mild tiredness but denies any major GI side effects from last cycle. -I have reviewed his blood work.  His CEA  level has gone up to 190.  He may proceed with cycle 4 today. -I plan to see him back in 2 weeks.  I will plan to rescan him after 6 cycles.  2.  Peripheral neuropathy: -This is likely from diabetes.  He has mild numbness in the feet predominantly at bedtime even prior to start of therapy. -This has not gotten any worse since the start of FOLFOX.  3.  Hypertension: -Blood pressure today is 117/75.  He will continue valsartan HCTZ.    Total time spent is 25 minutes with more than 50% of the time spent face-to-face discussing management of side effects and coordination of care.  Orders placed this encounter:  No orders of the defined types were placed in this encounter.     Christopher Jack, MD [place] [phone]

## 2018-08-25 NOTE — Progress Notes (Signed)
Labs reviewed at office visit today. Proceed with treatment today per MD.   Treatment given per orders. Patient tolerated it well without problems. Vitals stable and discharged home from clinic ambulatory. Follow up as scheduled.  

## 2018-08-25 NOTE — Patient Instructions (Signed)
[facility] Discharge Instructions for Patients Receiving Chemotherapy  Today you received the following chemotherapy agents   To help prevent nausea and vomiting after your treatment, we encourage you to take your nausea medication   If you develop nausea and vomiting that is not controlled by your nausea medication, call the clinic.   BELOW ARE SYMPTOMS THAT SHOULD BE REPORTED IMMEDIATELY:  *FEVER GREATER THAN 100.5 F  *CHILLS WITH OR WITHOUT FEVER  NAUSEA AND VOMITING THAT IS NOT CONTROLLED WITH YOUR NAUSEA MEDICATION  *UNUSUAL SHORTNESS OF BREATH  *UNUSUAL BRUISING OR BLEEDING  TENDERNESS IN MOUTH AND THROAT WITH OR WITHOUT PRESENCE OF ULCERS  *URINARY PROBLEMS  *BOWEL PROBLEMS  UNUSUAL RASH Items with * indicate a potential emergency and should be followed up as soon as possible.  Feel free to call the clinic should you have any questions or concerns. The clinic phone number is [phone].  Please show the CHEMO ALERT CARD at check-in to the Emergency Department and triage nurse.   

## 2018-08-26 ENCOUNTER — Other Ambulatory Visit: Payer: Self-pay

## 2018-08-27 ENCOUNTER — Inpatient Hospital Stay (HOSPITAL_COMMUNITY): Payer: Medicare PPO

## 2018-08-27 DIAGNOSIS — Z5111 Encounter for antineoplastic chemotherapy: Secondary | ICD-10-CM | POA: Diagnosis not present

## 2018-08-27 DIAGNOSIS — Z452 Encounter for adjustment and management of vascular access device: Secondary | ICD-10-CM | POA: Diagnosis not present

## 2018-08-27 DIAGNOSIS — C187 Malignant neoplasm of sigmoid colon: Secondary | ICD-10-CM | POA: Diagnosis not present

## 2018-08-27 DIAGNOSIS — C787 Secondary malignant neoplasm of liver and intrahepatic bile duct: Principal | ICD-10-CM

## 2018-08-27 DIAGNOSIS — C189 Malignant neoplasm of colon, unspecified: Secondary | ICD-10-CM

## 2018-08-27 DIAGNOSIS — C7800 Secondary malignant neoplasm of unspecified lung: Secondary | ICD-10-CM | POA: Diagnosis not present

## 2018-08-27 DIAGNOSIS — I10 Essential (primary) hypertension: Secondary | ICD-10-CM | POA: Diagnosis not present

## 2018-08-27 DIAGNOSIS — E782 Mixed hyperlipidemia: Secondary | ICD-10-CM | POA: Diagnosis not present

## 2018-08-27 MED ORDER — HEPARIN SOD (PORK) LOCK FLUSH 100 UNIT/ML IV SOLN
500.0000 [IU] | Freq: Once | INTRAVENOUS | Status: AC | PRN
  Administered 2018-08-27: 13:00:00 500 [IU]

## 2018-08-27 MED ORDER — SODIUM CHLORIDE 0.9% FLUSH
10.0000 mL | INTRAVENOUS | Status: DC | PRN
  Administered 2018-08-27: 10 mL
  Filled 2018-08-27: qty 10

## 2018-08-31 DIAGNOSIS — C189 Malignant neoplasm of colon, unspecified: Secondary | ICD-10-CM | POA: Diagnosis not present

## 2018-09-09 ENCOUNTER — Inpatient Hospital Stay (HOSPITAL_COMMUNITY): Payer: Medicare PPO | Attending: Hematology | Admitting: Hematology

## 2018-09-09 ENCOUNTER — Inpatient Hospital Stay (HOSPITAL_COMMUNITY): Payer: Medicare PPO

## 2018-09-09 ENCOUNTER — Other Ambulatory Visit: Payer: Self-pay

## 2018-09-09 ENCOUNTER — Encounter (HOSPITAL_COMMUNITY): Payer: Self-pay | Admitting: Hematology

## 2018-09-09 VITALS — BP 115/75 | HR 60 | Temp 98.0°F | Resp 18

## 2018-09-09 DIAGNOSIS — C787 Secondary malignant neoplasm of liver and intrahepatic bile duct: Secondary | ICD-10-CM | POA: Diagnosis not present

## 2018-09-09 DIAGNOSIS — C189 Malignant neoplasm of colon, unspecified: Secondary | ICD-10-CM | POA: Insufficient documentation

## 2018-09-09 DIAGNOSIS — R42 Dizziness and giddiness: Secondary | ICD-10-CM | POA: Diagnosis not present

## 2018-09-09 DIAGNOSIS — E1142 Type 2 diabetes mellitus with diabetic polyneuropathy: Secondary | ICD-10-CM

## 2018-09-09 DIAGNOSIS — R197 Diarrhea, unspecified: Secondary | ICD-10-CM | POA: Diagnosis not present

## 2018-09-09 DIAGNOSIS — C7800 Secondary malignant neoplasm of unspecified lung: Secondary | ICD-10-CM | POA: Diagnosis not present

## 2018-09-09 DIAGNOSIS — Z452 Encounter for adjustment and management of vascular access device: Secondary | ICD-10-CM | POA: Diagnosis not present

## 2018-09-09 DIAGNOSIS — I10 Essential (primary) hypertension: Secondary | ICD-10-CM | POA: Diagnosis not present

## 2018-09-09 DIAGNOSIS — Z87891 Personal history of nicotine dependence: Secondary | ICD-10-CM

## 2018-09-09 DIAGNOSIS — Z5111 Encounter for antineoplastic chemotherapy: Secondary | ICD-10-CM | POA: Diagnosis not present

## 2018-09-09 LAB — CBC WITH DIFFERENTIAL/PLATELET
Abs Immature Granulocytes: 0.02 10*3/uL (ref 0.00–0.07)
Basophils Absolute: 0.1 10*3/uL (ref 0.0–0.1)
Basophils Relative: 1 %
Eosinophils Absolute: 0.1 10*3/uL (ref 0.0–0.5)
Eosinophils Relative: 2 %
HCT: 38.6 % — ABNORMAL LOW (ref 39.0–52.0)
Hemoglobin: 12.1 g/dL — ABNORMAL LOW (ref 13.0–17.0)
Immature Granulocytes: 0 %
Lymphocytes Relative: 29 %
Lymphs Abs: 1.5 10*3/uL (ref 0.7–4.0)
MCH: 28.8 pg (ref 26.0–34.0)
MCHC: 31.3 g/dL (ref 30.0–36.0)
MCV: 91.9 fL (ref 80.0–100.0)
Monocytes Absolute: 0.7 10*3/uL (ref 0.1–1.0)
Monocytes Relative: 13 %
Neutro Abs: 3 10*3/uL (ref 1.7–7.7)
Neutrophils Relative %: 55 %
Platelets: 100 10*3/uL — ABNORMAL LOW (ref 150–400)
RBC: 4.2 MIL/uL — ABNORMAL LOW (ref 4.22–5.81)
RDW: 18.6 % — ABNORMAL HIGH (ref 11.5–15.5)
WBC: 5.4 10*3/uL (ref 4.0–10.5)
nRBC: 0 % (ref 0.0–0.2)

## 2018-09-09 LAB — COMPREHENSIVE METABOLIC PANEL
ALT: 19 U/L (ref 0–44)
AST: 30 U/L (ref 15–41)
Albumin: 3.9 g/dL (ref 3.5–5.0)
Alkaline Phosphatase: 136 U/L — ABNORMAL HIGH (ref 38–126)
Anion gap: 10 (ref 5–15)
BUN: 14 mg/dL (ref 8–23)
CO2: 28 mmol/L (ref 22–32)
Calcium: 9.6 mg/dL (ref 8.9–10.3)
Chloride: 101 mmol/L (ref 98–111)
Creatinine, Ser: 0.97 mg/dL (ref 0.61–1.24)
GFR calc Af Amer: >60 mL/min (ref >60)
GFR calc non Af Amer: >60 mL/min (ref >60)
Glucose, Bld: 134 mg/dL — ABNORMAL HIGH (ref 70–99)
Potassium: 4.3 mmol/L (ref 3.5–5.1)
Sodium: 139 mmol/L (ref 135–145)
Total Bilirubin: 0.5 mg/dL (ref 0.3–1.2)
Total Protein: 7.6 g/dL (ref 6.5–8.1)

## 2018-09-09 LAB — MAGNESIUM: Magnesium: 1.9 mg/dL (ref 1.7–2.4)

## 2018-09-09 MED ORDER — SODIUM CHLORIDE 0.9% FLUSH
10.0000 mL | INTRAVENOUS | Status: DC | PRN
  Administered 2018-09-09: 10 mL
  Filled 2018-09-09: qty 10

## 2018-09-09 MED ORDER — OXALIPLATIN CHEMO INJECTION 100 MG/20ML
68.0000 mg/m2 | Freq: Once | INTRAVENOUS | Status: AC
  Administered 2018-09-09: 145 mg via INTRAVENOUS
  Filled 2018-09-09: qty 20

## 2018-09-09 MED ORDER — PALONOSETRON HCL INJECTION 0.25 MG/5ML
0.2500 mg | Freq: Once | INTRAVENOUS | Status: AC
  Administered 2018-09-09: 0.25 mg via INTRAVENOUS
  Filled 2018-09-09: qty 5

## 2018-09-09 MED ORDER — SODIUM CHLORIDE 0.9 % IV SOLN
1920.0000 mg/m2 | INTRAVENOUS | Status: DC
  Administered 2018-09-09: 14:00:00 4150 mg via INTRAVENOUS
  Filled 2018-09-09: qty 83

## 2018-09-09 MED ORDER — SODIUM CHLORIDE 0.9 % IV SOLN
10.0000 mg | Freq: Once | INTRAVENOUS | Status: AC
  Administered 2018-09-09: 10 mg via INTRAVENOUS
  Filled 2018-09-09: qty 10

## 2018-09-09 MED ORDER — DEXTROSE 5 % IV SOLN
Freq: Once | INTRAVENOUS | Status: AC
  Administered 2018-09-09: 10:00:00 via INTRAVENOUS

## 2018-09-09 MED ORDER — FLUOROURACIL CHEMO INJECTION 2.5 GM/50ML
320.0000 mg/m2 | Freq: Once | INTRAVENOUS | Status: AC
  Administered 2018-09-09: 700 mg via INTRAVENOUS
  Filled 2018-09-09: qty 14

## 2018-09-09 MED ORDER — LEUCOVORIN CALCIUM INJECTION 350 MG
400.0000 mg/m2 | Freq: Once | INTRAVENOUS | Status: AC
  Administered 2018-09-09: 12:00:00 864 mg via INTRAVENOUS
  Filled 2018-09-09: qty 43.2

## 2018-09-09 NOTE — Patient Instructions (Signed)
[facility] Discharge Instructions for Patients Receiving Chemotherapy  Today you received the following chemotherapy agents  If you develop nausea and vomiting that is not controlled by your nausea medication, call the clinic.   BELOW ARE SYMPTOMS THAT SHOULD BE REPORTED IMMEDIATELY:  *FEVER GREATER THAN 100.5 F  *CHILLS WITH OR WITHOUT FEVER  NAUSEA AND VOMITING THAT IS NOT CONTROLLED WITH YOUR NAUSEA MEDICATION  *UNUSUAL SHORTNESS OF BREATH  *UNUSUAL BRUISING OR BLEEDING  TENDERNESS IN MOUTH AND THROAT WITH OR WITHOUT PRESENCE OF ULCERS  *URINARY PROBLEMS  *BOWEL PROBLEMS  UNUSUAL RASH Items with * indicate a potential emergency and should be followed up as soon as possible.  Feel free to call the clinic should you have any questions or concerns. The clinic phone number is [phone].  Please show the CHEMO ALERT CARD at check-in to the Emergency Department and triage nurse.   

## 2018-09-09 NOTE — Progress Notes (Signed)
Patient seen by Dr. Delton Coombes with platelets 100 and lab review with verbal order ok to treat today.   Patient tolerated chemotherapy with no complaints voiced.  Port site clean and dry with no bruising or swelling noted at site.  Good blood return noted before and after administration of chemotherapy.  Chemotherapy pump connected with no alarms noted.  Dressing intact.  Patient left ambulatory with VSS and no s/s of distress noted.

## 2018-09-09 NOTE — Patient Instructions (Addendum)
[facility] at [facility] Discharge Instructions  You were seen today by Dr. Katragadda. He went over your recent lab results. He will see you back in 2 weeks for labs, treatment and follow up.   Thank you for choosing [facility] at [facility] to provide your oncology and hematology care.  To afford each patient quality time with our provider, please arrive at least 15 minutes before your scheduled appointment time.   If you have a lab appointment with the Cancer Center please come in thru the  Main Entrance and check in at the main information desk  You need to re-schedule your appointment should you arrive 10 or more minutes late.  We strive to give you quality time with our providers, and arriving late affects you and other patients whose appointments are after yours.  Also, if you no show three or more times for appointments you may be dismissed from the clinic at the providers discretion.     Again, thank you for choosing [facility].  Our hope is that these requests will decrease the amount of time that you wait before being seen by our physicians.       _____________________________________________________________  Should you have questions after your visit to [facility], please contact our office at [phone] between the hours of 8:00 a.m. and 4:30 p.m.  Voicemails left after 4:00 p.m. will not be returned until the following business day.  For prescription refill requests, have your pharmacy contact our office and allow 72 hours.    Cancer Center Support Programs:   > Cancer Support Group  2nd Tuesday of the month 1pm-2pm, Journey Room    

## 2018-09-09 NOTE — Assessment & Plan Note (Signed)
1.  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 - He has completed 12 cycles of FOLFIRI on 10/29/2017.  He tolerated it very well.  CT CAP showed stable left upper lobe subcentimeter lung nodule, segment 4 lesion in the liver has decreased to 2.4 cm.  -He underwent left hepatic lobe microwave ablation on 12/10/2017.  He did very well with minimal pain and complications.  He is able to do all his day-to-day activities without any problems. -CT scan of the abdomen pelvis on 02/05/2018 showed left hepatic lesion with central high attenuation and a large area of low-attenuation.  There is a new left hepatic lobe lesion measuring 1.3 cm.  CT chest showed similar appearing small nodules. - MRI of the liver dated 02/25/2018 shows heterogeneous mass which surrounds the right side of the ablation defect and extends inferiorly in segment 4B measuring 6.9 x 5.6 cm, segment 2 lesion measuring 2.6 cm, segment 8 subcapsular 1.6 cm lesion.  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 - He has at least 3 new lesions and questionable capsular metastasis.  I do not believe he is a candidate for local therapy.  I have recommended going back on chemotherapy.  His last chemo was in July 2019, dose reduced FOLFIRI. - 6 cycles of FOLFIRI from 03/02/2018 through 05/27/2018. - CT CAP on 06/16/2018 shows progression of pulmonary nodules bilaterally, largest left upper lobe pulmonary nodule measuring 1.9 cm and right upper lobe pulmonary nodule 1.8 cm.  Right cardiophrenic angle adenopathy is new at 1.3 cm.  2 liver lesions, largest measuring 7.4 x 6.4 cm, a second largest measuring 5.5 x 4.4 cm.  Both have increased in size.  Development of extensive omental/peritoneal  metastasis.  An index right abdominal omental implant measures 2.2 x 3.3 cm.  No bone metastasis. - 4 cycles of FOLFOX (20% dose reduction) from 07/12/2020 08/25/2018. -He is not a candidate for Avastin because of prior history of bleeding.  He is not a candidate for EGFR therapy. -His right upper quadrant pain had disappeared after cycle 1. -He has mild tiredness after last cycle.  Diarrhea was on and off but not severely limiting. -Numbness in the feet has been stable.  I have reviewed his blood work.  He may proceed with cycle 5 today. -I will reevaluate him in 2 weeks.  I plan to repeat scans after cycle 6.   2.  Peripheral neuropathy: -This is likely from diabetes.  He has mild numbness in the feet predominantly at bedtime prior to start of therapy. -He has noticed the numbness has slightly worsened after cycle 4.  We will keep a close eye on it.  3.  Hypertension: -Blood pressure is well controlled today.  He will continue valsartan HCTZ.

## 2018-09-09 NOTE — Progress Notes (Signed)
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 [facility])   05/28/2017 - 06/09/2018 Chemotherapy    The patient had palonosetron (ALOXI) injection 0.25 mg, 0.25 mg, Intravenous,  Once, 18 of 22 cycles Administration: 0.25 mg (09/03/2017), 0.25 mg (10/01/2017), 0.25 mg (10/15/2017), 0.25 mg (10/29/2017), 0.25 mg (09/17/2017), 0.25 mg (03/02/2018), 0.25 mg (03/16/2018), 0.25 mg (03/30/2018), 0.25 mg (04/13/2018), 0.25 mg (05/13/2018), 0.25 mg (05/27/2018) irinotecan (CAMPTOSAR) 400 mg in dextrose 5 % 500 mL chemo infusion, 180 mg/m2, Intravenous,  Once, 18 of 22 cycles Dose modification: 144 mg/m2 (Cycle 4, Reason: Provider Judgment) Administration: 320 mg (09/03/2017), 320 mg (10/01/2017), 320 mg (10/15/2017), 320 mg (10/29/2017), 320 mg (09/17/2017), 320 mg (03/02/2018), 320 mg (03/16/2018), 320 mg (03/30/2018), 320 mg (04/13/2018), 320 mg (05/13/2018), 320 mg (05/27/2018) leucovorin 888 mg in dextrose 5 % 250 mL infusion, 400 mg/m2, Intravenous,  Once, 18 of 22 cycles Administration: 900 mg (09/03/2017), 900 mg (10/01/2017), 900 mg (10/15/2017), 900 mg (10/29/2017), 900 mg (09/17/2017), 900 mg (03/02/2018), 900 mg (03/16/2018), 900 mg (03/30/2018), 900 mg (04/13/2018), 900 mg (05/13/2018), 900 mg (05/27/2018) fluorouracil (ADRUCIL) chemo injection 900 mg, 400 mg/m2, Intravenous,  Once, 18 of 22 cycles Dose modification: 320 mg/m2 (Cycle 4, Reason: Provider Judgment) Administration: 700 mg (09/03/2017), 700 mg (10/01/2017), 700 mg (10/15/2017), 700 mg (10/29/2017), 700 mg (09/17/2017), 700 mg (03/02/2018), 700 mg (03/16/2018), 700 mg (03/30/2018), 700 mg (04/13/2018), 700 mg (05/13/2018), 700 mg (05/27/2018) fluorouracil (ADRUCIL) 5,350 mg in sodium chloride 0.9 % 143 mL chemo  infusion, 2,400 mg/m2, Intravenous, 1 Day/Dose, 18 of 22 cycles Dose modification: 1,920 mg/m2 (Cycle 4, Reason: Provider Judgment) Administration: 4,250 mg (09/03/2017), 4,250 mg (10/01/2017), 4,250 mg (10/15/2017), 4,250 mg (10/29/2017), 4,250 mg (09/17/2017), 4,250 mg (03/02/2018), 4,250 mg (03/16/2018), 4,250 mg (03/30/2018), 4,250 mg (04/13/2018), 4,250 mg (05/13/2018), 4,250 mg (05/27/2018)  for chemotherapy treatment.     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[place])    07/13/2018 -  Chemotherapy    The patient had palonosetron (ALOXI) injection 0.25 mg, 0.25 mg, Intravenous,  Once, 5 of 6 cycles Administration: 0.25 mg (07/13/2018), 0.25 mg (07/27/2018), 0.25 mg (08/10/2018), 0.25 mg (08/25/2018), 0.25 mg (09/09/2018) leucovorin 864 mg in dextrose 5 % 250 mL infusion, 400 mg/m2 = 864 mg, Intravenous,  Once, 5 of 6 cycles Administration: 864 mg (07/13/2018), 864 mg (07/27/2018), 864 mg (08/10/2018), 864 mg (08/25/2018), 864 mg (09/09/2018) oxaliplatin (ELOXATIN) 145 mg in dextrose 5 % 500 mL chemo infusion, 68 mg/m2 = 145 mg (80 % of original dose 85 mg/m2), Intravenous,  Once, 5 of 6 cycles Dose modification: 68 mg/m2 (80 % of original dose 85 mg/m2, Cycle 1, Reason: Other (see comments), Comment: Pre-existing neuropathy) Administration: 145 mg (07/13/2018), 145 mg (07/27/2018), 145 mg (08/10/2018), 145 mg (08/25/2018), 145 mg (09/09/2018) fluorouracil (ADRUCIL) chemo injection 700 mg, 320 mg/m2 = 700 mg (80 % of original dose 400 mg/m2), Intravenous,  Once, 5 of 6 cycles Dose modification: 320 mg/m2 (80 % of original dose 400 mg/m2, Cycle 1, Reason: Provider Judgment) Administration: 700 mg (07/13/2018), 700 mg (07/27/2018), 700 mg (08/10/2018), 700 mg (08/25/2018), 700 mg (09/09/2018) fluorouracil (ADRUCIL) 4,150 mg in sodium chloride 0.9 % 67 mL chemo infusion, 1,920  mg/m2 = 4,150 mg (80 % of original dose 2,400 mg/m2), Intravenous, 1 Day/Dose, 5 of 6 cycles Dose modification: 1,920  mg/m2 (80 % of original dose 2,400 mg/m2, Cycle 1, Reason: Provider Judgment), 1,920 mg/m2 (original dose 2,400 mg/m2, Cycle 4, Reason: Provider Judgment) Administration: 4,150 mg (07/13/2018), 4,150 mg (08/10/2018), 4,150 mg (07/27/2018), 4,150 mg (08/25/2018), 4,150 mg (09/09/2018)  for chemotherapy treatment.       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 Lerette 68 y.o. male returns for routine follow-up and consideration for next cycle of chemotherapy. He is here today alone. He states that he has diarrhea at times. He states that he has noticed dizziness and numbness in his feet is a little worse. Denies any nausea, or vomiting. Denies any new pains. Had not noticed any recent bleeding such as epistaxis, hematuria or hematochezia. Denies recent chest pain on exertion, shortness of breath on minimal exertion, pre-syncopal episodes, or palpitations. Denies any numbness or tingling in hands. Denies any recent fevers, infections, or recent hospitalizations. Patient reports appetite at 100% and energy level at 75%.      REVIEW OF SYSTEMS:  Review of Systems  Constitutional: Positive for fatigue.  Gastrointestinal: Positive for diarrhea.  Neurological: Positive for dizziness and numbness.     PAST MEDICAL/SURGICAL HISTORY:  Past Medical History:  Diagnosis Date  . Colon cancer ([place])   . Diabetes ([place])   . GERD (gastroesophageal reflux disease)   . Hypertension    Past Surgical History:  Procedure Laterality Date  . APPENDECTOMY     during colon surgery  . COLON SURGERY    . IR RADIOLOGIST EVAL & MGMT  11/26/2017  . RADIOFREQUENCY ABLATION N/A 12/10/2017   Procedure: CT MICROWAVE THERMAL ABLATION (LIVER);  Surgeon: Markus Daft, MD;  Location: [facility] ORS;  Service:  Anesthesiology;  Laterality: N/A;  . SHOULDER SURGERY  2003     SOCIAL HISTORY:  Social History   Socioeconomic History  . Marital status: Married    Spouse name: Not on file  . Number of children: Not on file  . Years of education: Not on file  . Highest education level: Not on file  Occupational History  . Not on file  Social Needs  . Financial resource strain: Not on file  . Food insecurity:    Worry: Not on file    Inability: Not on file  . Transportation needs:    Medical: Not on file    Non-medical: Not on file  Tobacco Use  . Smoking status: Former Smoker    Last attempt to quit: 04/05/1995    Years since quitting: 23.4  . Smokeless tobacco: Never Used  Substance and Sexual Activity  . Alcohol use: Not Currently    Alcohol/week: 0.0 standard drinks    Comment: used to drink but stopped about 20 years ago  . Drug use: Never  . Sexual activity: Not on file  Lifestyle  . Physical activity:    Days per week: Not on file    Minutes per session: Not on file  . Stress: Not on file  Relationships  . Social connections:    Talks on phone: Not on file    Gets together: Not on file    Attends religious service: Not on file    Active member  of club or organization: Not on file    Attends meetings of clubs or organizations: Not on file    Relationship status: Not on file  . Intimate partner violence:    Fear of current or ex partner: Not on file    Emotionally abused: Not on file    Physically abused: Not on file    Forced sexual activity: Not on file  Other Topics Concern  . Not on file  Social History Narrative  . Not on file    FAMILY HISTORY:  Family History  Problem Relation Age of Onset  . Diabetes Mother   . Cancer Father   . Bone cancer Father   . Colon cancer Neg Hx   . Colon polyps Neg Hx     CURRENT MEDICATIONS:  Outpatient Encounter Medications as of 09/09/2018  Medication Sig  . Alpha-Lipoic Acid 200 MG CAPS Take 1 capsule by mouth daily.   . Ascorbic Acid (VITAMIN C) 1000 MG tablet Take 2,000 mg by mouth daily.   . ASHWAGANDHA PO Take 800 mg by mouth daily.  Marland Kitchen atorvastatin (LIPITOR) 10 MG tablet Take 10 mg by mouth daily.   Marland Kitchen BEE POLLEN PO Take 1 capsule by mouth daily.   . Blood Glucose Monitoring Suppl (ONE TOUCH ULTRA MINI) W/DEVICE KIT USE TO CHECK BLOOD SUGAR DAILY.  . cholecalciferol (VITAMIN D) 1000 units tablet Take 5,000 Units by mouth daily.   Marland Kitchen CINNAMON PO Take 2,000 mg by mouth daily.   Marland Kitchen Cod Liver Oil CAPS Take 1 capsule by mouth daily.  . Coenzyme Q10 (CO Q 10) 100 MG CAPS Take 1 capsule by mouth daily.   . Cyanocobalamin (VITAMIN B-12) 5000 MCG TBDP Take 5,000 mcg by mouth daily.  . cyclobenzaprine (FLEXERIL) 10 MG tablet TAKE ONE TABLET BY MOUTH EVERY EVENING AT BEDTIME AS NEEDED FOR MUSCLE SPASMS.  Marland Kitchen diphenoxylate-atropine (LOMOTIL) 2.5-0.025 MG tablet Take 1 tablet by mouth every 6 (six) hours as needed for diarrhea or loose stools (stomach cramps).  . Flax Oil-Fish Oil-Borage Oil (FISH OIL-FLAX OIL-BORAGE OIL) CAPS Take 1 capsule by mouth daily.  . fluorouracil CALGB 18867 in sodium chloride 0.9 % 150 mL Inject into the vein over 96 hr.  . Garlic 7373 MG CAPS Take 2 capsules by mouth daily.   Marland Kitchen glipiZIDE (GLUCOTROL XL) 10 MG 24 hr tablet Take 10 mg by mouth daily.   . Glucosamine-Chondroit-Collagen ([employer] GLUCO-CHONDROIT PLUS UC-II PO) Take 1 tablet by mouth daily.   . IRINOTECAN HCL IV Inject into the vein every 14 (fourteen) days.   Marland Kitchen LECITHIN PO Take 1 capsule by mouth daily. 1200 mg  . LEUCOVORIN CALCIUM IV Inject into the vein every 14 (fourteen) days.   Marland Kitchen lidocaine-prilocaine (EMLA) cream Apply small amount over port site and cover with plastic wrap one hour prior to appointment.  . metFORMIN (GLUCOPHAGE) 500 MG tablet Take 1 tablet (500 mg total) by mouth 2 (two) times daily with a meal. (Patient taking differently: Take 1,000 mg by mouth 2 (two) times daily with a meal. )  . milk thistle 175 MG tablet  Take 175 mg by mouth daily.  Marland Kitchen OVER THE COUNTER MEDICATION Take 1 capsule by mouth daily. Mushroom Complex 750 mg  . OVER THE COUNTER MEDICATION Take 1 capsule by mouth daily. Cura Med 750 mg  . prochlorperazine (COMPAZINE) 10 MG tablet Take 1 tablet (10 mg total) by mouth every 6 (six) hours as needed for nausea or vomiting.  . valsartan-hydrochlorothiazide (DIOVAN-HCT)  160-25 MG tablet Take 1 tablet by mouth daily.  Marland Kitchen aspirin 81 MG tablet Take 81 mg by mouth daily.  . magnesium oxide (MAG-OX) 400 MG tablet Take 400 mg by mouth daily.   Marland Kitchen oxyCODONE 10 MG TABS Take 1 tablet (10 mg total) by mouth 2 (two) times daily as needed for severe pain. (Patient not taking: Reported on 09/09/2018)   Facility-Administered Encounter Medications as of 09/09/2018  Medication  . sodium chloride flush (NS) 0.9 % injection 10 mL    ALLERGIES:  No Known Allergies   PHYSICAL EXAM:  ECOG Performance status: 1  Vitals:   09/09/18 0840  BP: 127/83  Pulse: 66  Resp: 18  Temp: 98.3 F (36.8 C)  SpO2: 100%   Filed Weights   09/09/18 0840  Weight: 203 lb 12.8 oz (92.4 kg)    Physical Exam Vitals signs reviewed.  Constitutional:      Appearance: Normal appearance.  Cardiovascular:     Rate and Rhythm: Normal rate and regular rhythm.     Heart sounds: Normal heart sounds.  Pulmonary:     Effort: Pulmonary effort is normal.     Breath sounds: Normal breath sounds.  Abdominal:     General: There is no distension.     Palpations: Abdomen is soft. There is no mass.  Musculoskeletal:        General: No swelling.  Skin:    General: Skin is warm.  Neurological:     General: No focal deficit present.     Mental Status: He is alert and oriented to person, place, and time.  Psychiatric:        Mood and Affect: Mood normal.        Behavior: Behavior normal.      LABORATORY DATA:  I have reviewed the labs as listed.  CBC    Component Value Date/Time   WBC 5.4 09/09/2018 0806   RBC 4.20 (L)  09/09/2018 0806   HGB 12.1 (L) 09/09/2018 0806   HCT 38.6 (L) 09/09/2018 0806   PLT 100 (L) 09/09/2018 0806   MCV 91.9 09/09/2018 0806   MCH 28.8 09/09/2018 0806   MCHC 31.3 09/09/2018 0806   RDW 18.6 (H) 09/09/2018 0806   LYMPHSABS 1.5 09/09/2018 0806   MONOABS 0.7 09/09/2018 0806   EOSABS 0.1 09/09/2018 0806   BASOSABS 0.1 09/09/2018 0806   CMP Latest Ref Rng & Units 09/09/2018 08/24/2018 08/10/2018  Glucose 70 - 99 mg/dL 134(H) 138(H) 122(H)  BUN 8 - 23 mg/dL '14 13 14  ' Creatinine 0.61 - 1.24 mg/dL 0.97 0.98 0.88  Sodium 135 - 145 mmol/L 139 139 137  Potassium 3.5 - 5.1 mmol/L 4.3 3.9 4.0  Chloride 98 - 111 mmol/L 101 102 101  CO2 22 - 32 mmol/L '28 27 26  ' Calcium 8.9 - 10.3 mg/dL 9.6 9.5 9.5  Total Protein 6.5 - 8.1 g/dL 7.6 7.6 7.5  Total Bilirubin 0.3 - 1.2 mg/dL 0.5 0.6 0.7  Alkaline Phos 38 - 126 U/L 136(H) 143(H) 148(H)  AST 15 - 41 U/L '30 27 28  ' ALT 0 - 44 U/L '19 20 16       ' DIAGNOSTIC IMAGING:  I have independently reviewed the scans and discussed with the patient.   I have reviewed Venita Lick LPN's note and agree with the documentation.  I personally performed a face-to-face visit, made revisions and my assessment and plan is as follows.    ASSESSMENT & PLAN:   Adenocarcinoma of colon metastatic  to liver ([place]) 1.  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 - He has completed 12 cycles of FOLFIRI on 10/29/2017.  He tolerated it very well.  CT CAP showed stable left upper lobe subcentimeter lung nodule, segment 4 lesion in the liver has decreased to 2.4 cm.  -He underwent left hepatic lobe microwave ablation on 12/10/2017.  He did very well with minimal pain and complications.  He is able to do all his day-to-day activities without any  problems. -CT scan of the abdomen pelvis on 02/05/2018 showed left hepatic lesion with central high attenuation and a large area of low-attenuation.  There is a new left hepatic lobe lesion measuring 1.3 cm.  CT chest showed similar appearing small nodules. - MRI of the liver dated 02/25/2018 shows heterogeneous mass which surrounds the right side of the ablation defect and extends inferiorly in segment 4B measuring 6.9 x 5.6 cm, segment 2 lesion measuring 2.6 cm, segment 8 subcapsular 1.6 cm lesion.  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 - He has at least 3 new lesions and questionable capsular metastasis.  I do not believe he is a candidate for local therapy.  I have recommended going back on chemotherapy.  His last chemo was in July 2019, dose reduced FOLFIRI. - 6 cycles of FOLFIRI from 03/02/2018 through 05/27/2018. - CT CAP on 06/16/2018 shows progression of pulmonary nodules bilaterally, largest left upper lobe pulmonary nodule measuring 1.9 cm and right upper lobe pulmonary nodule 1.8 cm.  Right cardiophrenic angle adenopathy is new at 1.3 cm.  2 liver lesions, largest measuring 7.4 x 6.4 cm, a second largest measuring 5.5 x 4.4 cm.  Both have increased in size.  Development of extensive omental/peritoneal metastasis.  An index right abdominal omental implant measures 2.2 x 3.3 cm.  No bone metastasis. - 4 cycles of FOLFOX (20% dose reduction) from 07/12/2020 08/25/2018. -He is not a candidate for Avastin because of prior history of bleeding.  He is not a candidate for EGFR therapy. -His right upper quadrant pain had disappeared after cycle 1. -He has mild tiredness after last cycle.  Diarrhea was on and off but not severely limiting. -Numbness in the feet has been stable.  I have reviewed his blood work.  He may proceed with cycle 5 today. -I will reevaluate him in 2 weeks.  I plan to repeat scans after cycle 6.   2.  Peripheral neuropathy:  -This is likely from diabetes.  He has mild numbness in the feet predominantly at bedtime prior to start of therapy. -He has noticed the numbness has slightly worsened after cycle 4.  We will keep a close eye on it.  3.  Hypertension: -Blood pressure is well controlled today.  He will continue valsartan HCTZ.   Total time spent is 25 minutes with more than 50% of the time spent face-to-face discussing treatment plan, side effect management and coordination of care.    Orders placed this encounter:  No orders of the defined types were placed in this encounter.     Derek Jack, MD [place] [phone]

## 2018-09-10 LAB — CEA: CEA: 166 ng/mL — ABNORMAL HIGH (ref 0.0–4.7)

## 2018-09-11 ENCOUNTER — Encounter (HOSPITAL_COMMUNITY): Payer: Self-pay

## 2018-09-11 ENCOUNTER — Inpatient Hospital Stay (HOSPITAL_COMMUNITY): Payer: Medicare PPO

## 2018-09-11 ENCOUNTER — Other Ambulatory Visit: Payer: Self-pay

## 2018-09-11 VITALS — BP 130/75 | HR 88 | Temp 98.6°F | Resp 18

## 2018-09-11 DIAGNOSIS — C189 Malignant neoplasm of colon, unspecified: Secondary | ICD-10-CM

## 2018-09-11 DIAGNOSIS — Z5111 Encounter for antineoplastic chemotherapy: Secondary | ICD-10-CM | POA: Diagnosis not present

## 2018-09-11 DIAGNOSIS — C787 Secondary malignant neoplasm of liver and intrahepatic bile duct: Secondary | ICD-10-CM

## 2018-09-11 DIAGNOSIS — Z452 Encounter for adjustment and management of vascular access device: Secondary | ICD-10-CM | POA: Diagnosis not present

## 2018-09-11 DIAGNOSIS — C7800 Secondary malignant neoplasm of unspecified lung: Secondary | ICD-10-CM | POA: Diagnosis not present

## 2018-09-11 MED ORDER — HEPARIN SOD (PORK) LOCK FLUSH 100 UNIT/ML IV SOLN
500.0000 [IU] | Freq: Once | INTRAVENOUS | Status: AC | PRN
  Administered 2018-09-11: 500 [IU]

## 2018-09-11 MED ORDER — SODIUM CHLORIDE 0.9% FLUSH
10.0000 mL | INTRAVENOUS | Status: DC | PRN
  Administered 2018-09-11: 10 mL
  Filled 2018-09-11: qty 10

## 2018-09-11 NOTE — Progress Notes (Signed)
Patients chemotherapy pump disconnected with no complaints voiced.  Good blood return noted.  Port site clean and dry with no bruising or swelling noted at site.  Band aid applied.  VSS with discharge and left ambulatory with no s/s of distress noted.

## 2018-09-26 DIAGNOSIS — E782 Mixed hyperlipidemia: Secondary | ICD-10-CM | POA: Diagnosis not present

## 2018-09-26 DIAGNOSIS — I10 Essential (primary) hypertension: Secondary | ICD-10-CM | POA: Diagnosis not present

## 2018-09-26 DIAGNOSIS — E1165 Type 2 diabetes mellitus with hyperglycemia: Secondary | ICD-10-CM | POA: Diagnosis not present

## 2018-09-28 ENCOUNTER — Other Ambulatory Visit: Payer: Self-pay

## 2018-09-28 ENCOUNTER — Inpatient Hospital Stay (HOSPITAL_COMMUNITY): Payer: Medicare PPO | Attending: Hematology

## 2018-09-28 ENCOUNTER — Encounter (HOSPITAL_COMMUNITY): Payer: Self-pay | Admitting: Hematology

## 2018-09-28 ENCOUNTER — Inpatient Hospital Stay (HOSPITAL_BASED_OUTPATIENT_CLINIC_OR_DEPARTMENT_OTHER): Payer: Medicare PPO | Admitting: Hematology

## 2018-09-28 ENCOUNTER — Inpatient Hospital Stay (HOSPITAL_COMMUNITY): Payer: Medicare PPO

## 2018-09-28 VITALS — BP 125/72 | HR 67 | Temp 98.1°F | Resp 18

## 2018-09-28 VITALS — BP 125/75 | HR 79 | Temp 98.5°F | Resp 18 | Wt 200.4 lb

## 2018-09-28 DIAGNOSIS — I10 Essential (primary) hypertension: Secondary | ICD-10-CM | POA: Diagnosis not present

## 2018-09-28 DIAGNOSIS — C189 Malignant neoplasm of colon, unspecified: Secondary | ICD-10-CM

## 2018-09-28 DIAGNOSIS — C787 Secondary malignant neoplasm of liver and intrahepatic bile duct: Secondary | ICD-10-CM

## 2018-09-28 DIAGNOSIS — Z5111 Encounter for antineoplastic chemotherapy: Secondary | ICD-10-CM | POA: Diagnosis not present

## 2018-09-28 DIAGNOSIS — Z87891 Personal history of nicotine dependence: Secondary | ICD-10-CM

## 2018-09-28 DIAGNOSIS — Z452 Encounter for adjustment and management of vascular access device: Secondary | ICD-10-CM | POA: Insufficient documentation

## 2018-09-28 DIAGNOSIS — C7800 Secondary malignant neoplasm of unspecified lung: Secondary | ICD-10-CM | POA: Diagnosis not present

## 2018-09-28 DIAGNOSIS — E1142 Type 2 diabetes mellitus with diabetic polyneuropathy: Secondary | ICD-10-CM

## 2018-09-28 LAB — CBC WITH DIFFERENTIAL/PLATELET
Abs Immature Granulocytes: 0.02 10*3/uL (ref 0.00–0.07)
Basophils Absolute: 0.1 10*3/uL (ref 0.0–0.1)
Basophils Relative: 2 %
Eosinophils Absolute: 0.2 10*3/uL (ref 0.0–0.5)
Eosinophils Relative: 2 %
HCT: 40.2 % (ref 39.0–52.0)
Hemoglobin: 13.1 g/dL (ref 13.0–17.0)
Immature Granulocytes: 0 %
Lymphocytes Relative: 25 %
Lymphs Abs: 1.7 10*3/uL (ref 0.7–4.0)
MCH: 30.2 pg (ref 26.0–34.0)
MCHC: 32.6 g/dL (ref 30.0–36.0)
MCV: 92.6 fL (ref 80.0–100.0)
Monocytes Absolute: 1 10*3/uL (ref 0.1–1.0)
Monocytes Relative: 15 %
Neutro Abs: 3.9 10*3/uL (ref 1.7–7.7)
Neutrophils Relative %: 56 %
Platelets: 155 10*3/uL (ref 150–400)
RBC: 4.34 MIL/uL (ref 4.22–5.81)
RDW: 17.7 % — ABNORMAL HIGH (ref 11.5–15.5)
WBC: 6.9 10*3/uL (ref 4.0–10.5)
nRBC: 0 % (ref 0.0–0.2)

## 2018-09-28 LAB — COMPREHENSIVE METABOLIC PANEL
ALT: 19 U/L (ref 0–44)
AST: 33 U/L (ref 15–41)
Albumin: 4 g/dL (ref 3.5–5.0)
Alkaline Phosphatase: 169 U/L — ABNORMAL HIGH (ref 38–126)
Anion gap: 17 — ABNORMAL HIGH (ref 5–15)
BUN: 17 mg/dL (ref 8–23)
CO2: 26 mmol/L (ref 22–32)
Calcium: 9.8 mg/dL (ref 8.9–10.3)
Chloride: 95 mmol/L — ABNORMAL LOW (ref 98–111)
Creatinine, Ser: 0.98 mg/dL (ref 0.61–1.24)
GFR calc Af Amer: >60 mL/min (ref >60)
GFR calc non Af Amer: >60 mL/min (ref >60)
Glucose, Bld: 167 mg/dL — ABNORMAL HIGH (ref 70–99)
Potassium: 4.7 mmol/L (ref 3.5–5.1)
Sodium: 138 mmol/L (ref 135–145)
Total Bilirubin: 0.6 mg/dL (ref 0.3–1.2)
Total Protein: 8.2 g/dL — ABNORMAL HIGH (ref 6.5–8.1)

## 2018-09-28 LAB — MAGNESIUM: Magnesium: 1.8 mg/dL (ref 1.7–2.4)

## 2018-09-28 MED ORDER — DEXTROSE 5 % IV SOLN
Freq: Once | INTRAVENOUS | Status: AC
  Administered 2018-09-28: 09:00:00 via INTRAVENOUS

## 2018-09-28 MED ORDER — LEUCOVORIN CALCIUM INJECTION 350 MG
400.0000 mg/m2 | Freq: Once | INTRAVENOUS | Status: AC
  Administered 2018-09-28: 864 mg via INTRAVENOUS
  Filled 2018-09-28: qty 43.2

## 2018-09-28 MED ORDER — SODIUM CHLORIDE 0.9 % IV SOLN
1920.0000 mg/m2 | INTRAVENOUS | Status: DC
  Administered 2018-09-28: 4150 mg via INTRAVENOUS
  Filled 2018-09-28: qty 83

## 2018-09-28 MED ORDER — FLUOROURACIL CHEMO INJECTION 2.5 GM/50ML
320.0000 mg/m2 | Freq: Once | INTRAVENOUS | Status: AC
  Administered 2018-09-28: 13:00:00 700 mg via INTRAVENOUS
  Filled 2018-09-28: qty 14

## 2018-09-28 MED ORDER — SODIUM CHLORIDE 0.9 % IV SOLN
10.0000 mg | Freq: Once | INTRAVENOUS | Status: AC
  Administered 2018-09-28: 10 mg via INTRAVENOUS
  Filled 2018-09-28: qty 1

## 2018-09-28 MED ORDER — OXALIPLATIN CHEMO INJECTION 100 MG/20ML
68.0000 mg/m2 | Freq: Once | INTRAVENOUS | Status: AC
  Administered 2018-09-28: 145 mg via INTRAVENOUS
  Filled 2018-09-28: qty 20

## 2018-09-28 MED ORDER — PALONOSETRON HCL INJECTION 0.25 MG/5ML
0.2500 mg | Freq: Once | INTRAVENOUS | Status: AC
  Administered 2018-09-28: 0.25 mg via INTRAVENOUS
  Filled 2018-09-28: qty 5

## 2018-09-28 MED ORDER — SODIUM CHLORIDE 0.9% FLUSH
10.0000 mL | INTRAVENOUS | Status: DC | PRN
  Administered 2018-09-28: 10 mL
  Filled 2018-09-28: qty 10

## 2018-09-28 NOTE — Patient Instructions (Signed)
[facility] at [facility] Discharge Instructions  You were seen today by Dr. Katragadda. He went over your recent lab results. He will see you back in 2 weeks for labs and follow up.   Thank you for choosing [facility] at [facility] to provide your oncology and hematology care.  To afford each patient quality time with our provider, please arrive at least 15 minutes before your scheduled appointment time.   If you have a lab appointment with the Cancer Center please come in thru the  Main Entrance and check in at the main information desk  You need to re-schedule your appointment should you arrive 10 or more minutes late.  We strive to give you quality time with our providers, and arriving late affects you and other patients whose appointments are after yours.  Also, if you no show three or more times for appointments you may be dismissed from the clinic at the providers discretion.     Again, thank you for choosing [facility].  Our hope is that these requests will decrease the amount of time that you wait before being seen by our physicians.       _____________________________________________________________  Should you have questions after your visit to  Cancer Center, please contact our office at [phone] between the hours of 8:00 a.m. and 4:30 p.m.  Voicemails left after 4:00 p.m. will not be returned until the following business day.  For prescription refill requests, have your pharmacy contact our office and allow 72 hours.    Cancer Center Support Programs:   > Cancer Support Group  2nd Tuesday of the month 1pm-2pm, Journey Room    

## 2018-09-28 NOTE — Patient Instructions (Signed)
[facility] Discharge Instructions for Patients Receiving Chemotherapy  Today you received the following chemotherapy agents   To help prevent nausea and vomiting after your treatment, we encourage you to take your nausea medication   If you develop nausea and vomiting that is not controlled by your nausea medication, call the clinic.   BELOW ARE SYMPTOMS THAT SHOULD BE REPORTED IMMEDIATELY:  *FEVER GREATER THAN 100.5 F  *CHILLS WITH OR WITHOUT FEVER  NAUSEA AND VOMITING THAT IS NOT CONTROLLED WITH YOUR NAUSEA MEDICATION  *UNUSUAL SHORTNESS OF BREATH  *UNUSUAL BRUISING OR BLEEDING  TENDERNESS IN MOUTH AND THROAT WITH OR WITHOUT PRESENCE OF ULCERS  *URINARY PROBLEMS  *BOWEL PROBLEMS  UNUSUAL RASH Items with * indicate a potential emergency and should be followed up as soon as possible.  Feel free to call the clinic should you have any questions or concerns. The clinic phone number is [phone].  Please show the CHEMO ALERT CARD at check-in to the Emergency Department and triage nurse.   

## 2018-09-28 NOTE — Progress Notes (Signed)
Pt presents today for doctor appointment and treatment. Labs reviewed by Dr. Delton Coombes. Parameters met for treatment. Message received from [facility] LPN. Proceed with treatment.

## 2018-09-28 NOTE — Assessment & Plan Note (Signed)
1.  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 - He has completed 12 cycles of FOLFIRI on 10/29/2017.  He tolerated it very well.  CT CAP showed stable left upper lobe subcentimeter lung nodule, segment 4 lesion in the liver has decreased to 2.4 cm.  -He underwent left hepatic lobe microwave ablation on 12/10/2017.  He did very well with minimal pain and complications.  He is able to do all his day-to-day activities without any problems. -CT scan of the abdomen pelvis on 02/05/2018 showed left hepatic lesion with central high attenuation and a large area of low-attenuation.  There is a new left hepatic lobe lesion measuring 1.3 cm.  CT chest showed similar appearing small nodules. - MRI of the liver dated 02/25/2018 shows heterogeneous mass which surrounds the right side of the ablation defect and extends inferiorly in segment 4B measuring 6.9 x 5.6 cm, segment 2 lesion measuring 2.6 cm, segment 8 subcapsular 1.6 cm lesion.  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 - He has at least 3 new lesions and questionable capsular metastasis.  I do not believe he is a candidate for local therapy.  I have recommended going back on chemotherapy.  His last chemo was in July 2019, dose reduced FOLFIRI. - 6 cycles of FOLFIRI from 03/02/2018 through 05/27/2018. - CT CAP on 06/16/2018 shows progression of pulmonary nodules bilaterally, largest left upper lobe pulmonary nodule measuring 1.9 cm and right upper lobe pulmonary nodule 1.8 cm.  Right cardiophrenic angle adenopathy is new at 1.3 cm.  2 liver lesions, largest measuring 7.4 x 6.4 cm, a second largest measuring 5.5 x 4.4 cm.  Both have increased in size.  Development of extensive omental/peritoneal  metastasis.  An index right abdominal omental implant measures 2.2 x 3.3 cm.  No bone metastasis. - 5 cycles of FOLFOX (20% dose reduction) from 07/13/2018 through 09/09/2018. -He is not a candidate for Avastin because of prior history of bleeding.  He is not a candidate for EGFR therapy. -Right upper quadrant improved immediately after cycle 1.  He is tolerating chemotherapy very well. -He did notice that numbness in the feet has become slightly worse after cycle 5.  Denies any balance issues. -We will proceed with cycle 6 today without any dose changes. -I plan to repeat CT scans after cycle in 2 weeks.   2.  Peripheral neuropathy: -This is likely from diabetes.  Mild numbness in the feet predominantly at bedtime was present prior to start of therapy. -After cycle 5 he noticed his numbness has slightly worsened.  We will keep a close eye on it.  3.  Hypertension: -Blood pressures well controlled today.  He will continue valsartan HCTZ.  

## 2018-09-28 NOTE — Progress Notes (Signed)
Treatment given today per MD orders. Tolerated infusion without adverse affects. Vital signs stable. 91fu pump infusing and RUN noted at the top of the pump screen. No complaints at this time. Discharged from clinic ambulatory. F/U with [facility] as scheduled.

## 2018-09-28 NOTE — Progress Notes (Signed)
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 [facility])   05/28/2017 - 06/09/2018 Chemotherapy    The patient had palonosetron (ALOXI) injection 0.25 mg, 0.25 mg, Intravenous,  Once, 18 of 22 cycles Administration: 0.25 mg (09/03/2017), 0.25 mg (10/01/2017), 0.25 mg (10/15/2017), 0.25 mg (10/29/2017), 0.25 mg (09/17/2017), 0.25 mg (03/02/2018), 0.25 mg (03/16/2018), 0.25 mg (03/30/2018), 0.25 mg (04/13/2018), 0.25 mg (05/13/2018), 0.25 mg (05/27/2018) irinotecan (CAMPTOSAR) 400 mg in dextrose 5 % 500 mL chemo infusion, 180 mg/m2, Intravenous,  Once, 18 of 22 cycles Dose modification: 144 mg/m2 (Cycle 4, Reason: Provider Judgment) Administration: 320 mg (09/03/2017), 320 mg (10/01/2017), 320 mg (10/15/2017), 320 mg (10/29/2017), 320 mg (09/17/2017), 320 mg (03/02/2018), 320 mg (03/16/2018), 320 mg (03/30/2018), 320 mg (04/13/2018), 320 mg (05/13/2018), 320 mg (05/27/2018) leucovorin 888 mg in dextrose 5 % 250 mL infusion, 400 mg/m2, Intravenous,  Once, 18 of 22 cycles Administration: 900 mg (09/03/2017), 900 mg (10/01/2017), 900 mg (10/15/2017), 900 mg (10/29/2017), 900 mg (09/17/2017), 900 mg (03/02/2018), 900 mg (03/16/2018), 900 mg (03/30/2018), 900 mg (04/13/2018), 900 mg (05/13/2018), 900 mg (05/27/2018) fluorouracil (ADRUCIL) chemo injection 900 mg, 400 mg/m2, Intravenous,  Once, 18 of 22 cycles Dose modification: 320 mg/m2 (Cycle 4, Reason: Provider Judgment) Administration: 700 mg (09/03/2017), 700 mg (10/01/2017), 700 mg (10/15/2017), 700 mg (10/29/2017), 700 mg (09/17/2017), 700 mg (03/02/2018), 700 mg (03/16/2018), 700 mg (03/30/2018), 700 mg (04/13/2018), 700 mg (05/13/2018), 700 mg (05/27/2018) fluorouracil (ADRUCIL) 5,350 mg in sodium chloride 0.9 % 143 mL chemo  infusion, 2,400 mg/m2, Intravenous, 1 Day/Dose, 18 of 22 cycles Dose modification: 1,920 mg/m2 (Cycle 4, Reason: Provider Judgment) Administration: 4,250 mg (09/03/2017), 4,250 mg (10/01/2017), 4,250 mg (10/15/2017), 4,250 mg (10/29/2017), 4,250 mg (09/17/2017), 4,250 mg (03/02/2018), 4,250 mg (03/16/2018), 4,250 mg (03/30/2018), 4,250 mg (04/13/2018), 4,250 mg (05/13/2018), 4,250 mg (05/27/2018)  for chemotherapy treatment.     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[place])    07/13/2018 -  Chemotherapy    The patient had palonosetron (ALOXI) injection 0.25 mg, 0.25 mg, Intravenous,  Once, 6 of 7 cycles Administration: 0.25 mg (07/13/2018), 0.25 mg (07/27/2018), 0.25 mg (08/10/2018), 0.25 mg (08/25/2018), 0.25 mg (09/09/2018) leucovorin 864 mg in dextrose 5 % 250 mL infusion, 400 mg/m2 = 864 mg, Intravenous,  Once, 6 of 7 cycles Administration: 864 mg (07/13/2018), 864 mg (07/27/2018), 864 mg (08/10/2018), 864 mg (08/25/2018), 864 mg (09/09/2018) oxaliplatin (ELOXATIN) 145 mg in dextrose 5 % 500 mL chemo infusion, 68 mg/m2 = 145 mg (80 % of original dose 85 mg/m2), Intravenous,  Once, 6 of 7 cycles Dose modification: 68 mg/m2 (80 % of original dose 85 mg/m2, Cycle 1, Reason: Other (see comments), Comment: Pre-existing neuropathy) Administration: 145 mg (07/13/2018), 145 mg (07/27/2018), 145 mg (08/10/2018), 145 mg (08/25/2018), 145 mg (09/09/2018) fluorouracil (ADRUCIL) chemo injection 700 mg, 320 mg/m2 = 700 mg (80 % of original dose 400 mg/m2), Intravenous,  Once, 6 of 7 cycles Dose modification: 320 mg/m2 (80 % of original dose 400 mg/m2, Cycle 1, Reason: Provider Judgment) Administration: 700 mg (07/13/2018), 700 mg (07/27/2018), 700 mg (08/10/2018), 700 mg (08/25/2018), 700 mg (09/09/2018) fluorouracil (ADRUCIL) 4,150 mg in sodium chloride 0.9 % 67 mL chemo infusion, 1,920  mg/m2 = 4,150 mg (80 % of original dose 2,400 mg/m2), Intravenous, 1 Day/Dose, 6 of 7 cycles Dose modification: 1,920  mg/m2 (80 % of original dose 2,400 mg/m2, Cycle 1, Reason: Provider Judgment), 1,920 mg/m2 (original dose 2,400 mg/m2, Cycle 4, Reason: Provider Judgment) Administration: 4,150 mg (07/13/2018), 4,150 mg (08/10/2018), 4,150 mg (07/27/2018), 4,150 mg (08/25/2018), 4,150 mg (09/09/2018)  for chemotherapy treatment.       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 Sheard 68 y.o. male returns for routine follow-up and consideration for next cycle of chemotherapy. He is here today alone. He states that he has been well since his last visit. Denies any nausea, vomiting, or diarrhea.  Numbness in the feet has been slightly worse since the last treatment.  Denies any new pains. Had not noticed any recent bleeding such as epistaxis, hematuria or hematochezia. Denies recent chest pain on exertion, shortness of breath on minimal exertion, pre-syncopal episodes, or palpitations. Denies any recent fevers, infections, or recent hospitalizations. Patient reports appetite at 100 % and energy level at 25 %.      REVIEW OF SYSTEMS:  Review of Systems  Constitutional: Positive for fatigue.  Neurological: Positive for dizziness.     PAST MEDICAL/SURGICAL HISTORY:  Past Medical History:  Diagnosis Date  . Colon cancer ([place])   . Diabetes ([place])   . GERD (gastroesophageal reflux disease)   . Hypertension    Past Surgical History:  Procedure Laterality Date  . APPENDECTOMY     during colon surgery  . COLON SURGERY    . IR RADIOLOGIST EVAL & MGMT  11/26/2017  . RADIOFREQUENCY ABLATION N/A 12/10/2017   Procedure: CT MICROWAVE THERMAL ABLATION (LIVER);  Surgeon: Markus Daft, MD;  Location: [facility] ORS;  Service: Anesthesiology;  Laterality: N/A;  . SHOULDER SURGERY  2003     SOCIAL HISTORY:  Social  History   Socioeconomic History  . Marital status: Married    Spouse name: Not on file  . Number of children: Not on file  . Years of education: Not on file  . Highest education level: Not on file  Occupational History  . Not on file  Social Needs  . Financial resource strain: Not on file  . Food insecurity:    Worry: Not on file    Inability: Not on file  . Transportation needs:    Medical: Not on file    Non-medical: Not on file  Tobacco Use  . Smoking status: Former Smoker    Last attempt to quit: 04/05/1995    Years since quitting: 23.4  . Smokeless tobacco: Never Used  Substance and Sexual Activity  . Alcohol use: Not Currently    Alcohol/week: 0.0 standard drinks    Comment: used to drink but stopped about 20 years ago  . Drug use: Never  . Sexual activity: Not on file  Lifestyle  . Physical activity:    Days per week: Not on file    Minutes per session: Not on file  . Stress: Not on file  Relationships  . Social connections:    Talks on phone: Not on file    Gets together: Not on file    Attends religious service: Not on file    Active member of club or organization: Not on file  Attends meetings of clubs or organizations: Not on file    Relationship status: Not on file  . Intimate partner violence:    Fear of current or ex partner: Not on file    Emotionally abused: Not on file    Physically abused: Not on file    Forced sexual activity: Not on file  Other Topics Concern  . Not on file  Social History Narrative  . Not on file    FAMILY HISTORY:  Family History  Problem Relation Age of Onset  . Diabetes Mother   . Cancer Father   . Bone cancer Father   . Colon cancer Neg Hx   . Colon polyps Neg Hx     CURRENT MEDICATIONS:  Outpatient Encounter Medications as of 09/28/2018  Medication Sig  . Alpha-Lipoic Acid 200 MG CAPS Take 1 capsule by mouth daily.  . Ascorbic Acid (VITAMIN C) 1000 MG tablet Take 2,000 mg by mouth daily.   . ASHWAGANDHA PO  Take 800 mg by mouth daily.  Marland Kitchen aspirin 81 MG tablet Take 81 mg by mouth daily.  Marland Kitchen atorvastatin (LIPITOR) 10 MG tablet Take 10 mg by mouth daily.   Marland Kitchen BEE POLLEN PO Take 1 capsule by mouth daily.   . Blood Glucose Monitoring Suppl (ONE TOUCH ULTRA MINI) W/DEVICE KIT USE TO CHECK BLOOD SUGAR DAILY.  . cholecalciferol (VITAMIN D) 1000 units tablet Take 5,000 Units by mouth daily.   Marland Kitchen CINNAMON PO Take 2,000 mg by mouth daily.   Marland Kitchen Cod Liver Oil CAPS Take 1 capsule by mouth daily.  . Coenzyme Q10 (CO Q 10) 100 MG CAPS Take 1 capsule by mouth daily.   . Cyanocobalamin (VITAMIN B-12) 5000 MCG TBDP Take 5,000 mcg by mouth daily.  . cyclobenzaprine (FLEXERIL) 10 MG tablet TAKE ONE TABLET BY MOUTH EVERY EVENING AT BEDTIME AS NEEDED FOR MUSCLE SPASMS.  Marland Kitchen diphenoxylate-atropine (LOMOTIL) 2.5-0.025 MG tablet Take 1 tablet by mouth every 6 (six) hours as needed for diarrhea or loose stools (stomach cramps).  . Flax Oil-Fish Oil-Borage Oil (FISH OIL-FLAX OIL-BORAGE OIL) CAPS Take 1 capsule by mouth daily.  . fluorouracil CALGB 47096 in sodium chloride 0.9 % 150 mL Inject into the vein over 96 hr.  . Garlic 2836 MG CAPS Take 2 capsules by mouth daily.   Marland Kitchen glipiZIDE (GLUCOTROL XL) 10 MG 24 hr tablet Take 10 mg by mouth daily.   . Glucosamine-Chondroit-Collagen ([employer] GLUCO-CHONDROIT PLUS UC-II PO) Take 1 tablet by mouth daily.   . IRINOTECAN HCL IV Inject into the vein every 14 (fourteen) days.   Marland Kitchen LECITHIN PO Take 1 capsule by mouth daily. 1200 mg  . LEUCOVORIN CALCIUM IV Inject into the vein every 14 (fourteen) days.   Marland Kitchen lidocaine-prilocaine (EMLA) cream Apply small amount over port site and cover with plastic wrap one hour prior to appointment.  . magnesium oxide (MAG-OX) 400 MG tablet Take 400 mg by mouth daily.   . metFORMIN (GLUCOPHAGE) 500 MG tablet Take 1 tablet (500 mg total) by mouth 2 (two) times daily with a meal. (Patient taking differently: Take 1,000 mg by mouth 2 (two) times daily with a meal. )  .  milk thistle 175 MG tablet Take 175 mg by mouth daily.  Marland Kitchen OVER THE COUNTER MEDICATION Take 1 capsule by mouth daily. Mushroom Complex 750 mg  . OVER THE COUNTER MEDICATION Take 1 capsule by mouth daily. Cura Med 750 mg  . oxyCODONE 10 MG TABS Take 1 tablet (10 mg total)  by mouth 2 (two) times daily as needed for severe pain.  Marland Kitchen prochlorperazine (COMPAZINE) 10 MG tablet Take 1 tablet (10 mg total) by mouth every 6 (six) hours as needed for nausea or vomiting.  . valsartan-hydrochlorothiazide (DIOVAN-HCT) 160-25 MG tablet Take 1 tablet by mouth daily.   Facility-Administered Encounter Medications as of 09/28/2018  Medication  . sodium chloride flush (NS) 0.9 % injection 10 mL    ALLERGIES:  No Known Allergies   PHYSICAL EXAM:  ECOG Performance status: 1  Vitals:   09/28/18 0829  BP: 125/75  Pulse: 79  Resp: 18  Temp: 98.5 F (36.9 C)  SpO2: 100%   Filed Weights   09/28/18 0829  Weight: 200 lb 6.4 oz (90.9 kg)    Physical Exam Vitals signs reviewed.  Constitutional:      Appearance: Normal appearance.  Cardiovascular:     Rate and Rhythm: Normal rate and regular rhythm.     Heart sounds: Normal heart sounds.  Pulmonary:     Effort: Pulmonary effort is normal.     Breath sounds: Normal breath sounds.  Abdominal:     General: There is no distension.     Palpations: Abdomen is soft. There is no mass.  Musculoskeletal:        General: No swelling.  Skin:    General: Skin is warm.  Neurological:     General: No focal deficit present.     Mental Status: He is alert and oriented to person, place, and time.  Psychiatric:        Mood and Affect: Mood normal.        Behavior: Behavior normal.      LABORATORY DATA:  I have reviewed the labs as listed.  CBC    Component Value Date/Time   WBC 6.9 09/28/2018 0809   RBC 4.34 09/28/2018 0809   HGB 13.1 09/28/2018 0809   HCT 40.2 09/28/2018 0809   PLT 155 09/28/2018 0809   MCV 92.6 09/28/2018 0809   MCH 30.2  09/28/2018 0809   MCHC 32.6 09/28/2018 0809   RDW 17.7 (H) 09/28/2018 0809   LYMPHSABS 1.7 09/28/2018 0809   MONOABS 1.0 09/28/2018 0809   EOSABS 0.2 09/28/2018 0809   BASOSABS 0.1 09/28/2018 0809   CMP Latest Ref Rng & Units 09/28/2018 09/09/2018 08/24/2018  Glucose 70 - 99 mg/dL 167(H) 134(H) 138(H)  BUN 8 - 23 mg/dL '17 14 13  ' Creatinine 0.61 - 1.24 mg/dL 0.98 0.97 0.98  Sodium 135 - 145 mmol/L 138 139 139  Potassium 3.5 - 5.1 mmol/L 4.7 4.3 3.9  Chloride 98 - 111 mmol/L 95(L) 101 102  CO2 22 - 32 mmol/L '26 28 27  ' Calcium 8.9 - 10.3 mg/dL 9.8 9.6 9.5  Total Protein 6.5 - 8.1 g/dL 8.2(H) 7.6 7.6  Total Bilirubin 0.3 - 1.2 mg/dL 0.6 0.5 0.6  Alkaline Phos 38 - 126 U/L 169(H) 136(H) 143(H)  AST 15 - 41 U/L 33 30 27  ALT 0 - 44 U/L '19 19 20       ' DIAGNOSTIC IMAGING:  I have independently reviewed the scans and discussed with the patient.   I have reviewed Venita Lick LPN's note and agree with the documentation.  I personally performed a face-to-face visit, made revisions and my assessment and plan is as follows.    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 - He has completed 12 cycles of FOLFIRI on 10/29/2017.  He tolerated it very well.  CT CAP showed stable left upper lobe subcentimeter lung nodule, segment 4 lesion in the liver has decreased to 2.4 cm.  -He underwent left hepatic lobe microwave ablation on 12/10/2017.  He did very well with minimal pain and complications.  He is able to do all his day-to-day activities without any problems. -CT scan of the abdomen pelvis on 02/05/2018 showed left hepatic lesion with central high attenuation and a large area of low-attenuation.   There is a new left hepatic lobe lesion measuring 1.3 cm.  CT chest showed similar appearing small nodules. - MRI of the liver dated 02/25/2018 shows heterogeneous mass which surrounds the right side of the ablation defect and extends inferiorly in segment 4B measuring 6.9 x 5.6 cm, segment 2 lesion measuring 2.6 cm, segment 8 subcapsular 1.6 cm lesion.  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 - He has at least 3 new lesions and questionable capsular metastasis.  I do not believe he is a candidate for local therapy.  I have recommended going back on chemotherapy.  His last chemo was in July 2019, dose reduced FOLFIRI. - 6 cycles of FOLFIRI from 03/02/2018 through 05/27/2018. - CT CAP on 06/16/2018 shows progression of pulmonary nodules bilaterally, largest left upper lobe pulmonary nodule measuring 1.9 cm and right upper lobe pulmonary nodule 1.8 cm.  Right cardiophrenic angle adenopathy is new at 1.3 cm.  2 liver lesions, largest measuring 7.4 x 6.4 cm, a second largest measuring 5.5 x 4.4 cm.  Both have increased in size.  Development of extensive omental/peritoneal metastasis.  An index right abdominal omental implant measures 2.2 x 3.3 cm.  No bone metastasis. - 5 cycles of FOLFOX (20% dose reduction) from 07/13/2018 through 09/09/2018. -He is not a candidate for Avastin because of prior history of bleeding.  He is not a candidate for EGFR therapy. -Right upper quadrant improved immediately after cycle 1.  He is tolerating chemotherapy very well. -He did notice that numbness in the feet has become slightly worse after cycle 5.  Denies any balance issues. -We will proceed with cycle 6 today without any dose changes. -I plan to repeat CT scans after cycle in 2 weeks.   2.  Peripheral neuropathy: -This is likely from diabetes.  Mild numbness in the feet predominantly at bedtime was present prior to start of therapy. -After cycle 5 he noticed his  numbness has slightly worsened.  We will keep a close eye on it.  3.  Hypertension: -Blood pressures well controlled today.  He will continue valsartan HCTZ.    Total time spent is 25 minutes with more than 50% of the time spent face-to-face discussing treatment plan and coordination of care.  Orders placed this encounter:  Orders Placed This Encounter  Procedures  . CT Abdomen Pelvis W Contrast  . CT Chest W Contrast  . CBC with Differential/Platelet  . Comprehensive metabolic panel  . CEA      Derek Jack, MD [place] [phone]

## 2018-09-29 LAB — CEA: CEA: 148 ng/mL — ABNORMAL HIGH (ref 0.0–4.7)

## 2018-09-30 ENCOUNTER — Inpatient Hospital Stay (HOSPITAL_COMMUNITY): Payer: Medicare PPO

## 2018-09-30 ENCOUNTER — Other Ambulatory Visit: Payer: Self-pay

## 2018-09-30 ENCOUNTER — Encounter (HOSPITAL_COMMUNITY): Payer: Self-pay

## 2018-09-30 VITALS — BP 130/68 | HR 66 | Temp 98.1°F | Resp 18

## 2018-09-30 DIAGNOSIS — Z5111 Encounter for antineoplastic chemotherapy: Secondary | ICD-10-CM | POA: Diagnosis not present

## 2018-09-30 DIAGNOSIS — C7800 Secondary malignant neoplasm of unspecified lung: Secondary | ICD-10-CM | POA: Diagnosis not present

## 2018-09-30 DIAGNOSIS — C787 Secondary malignant neoplasm of liver and intrahepatic bile duct: Secondary | ICD-10-CM | POA: Diagnosis not present

## 2018-09-30 DIAGNOSIS — Z452 Encounter for adjustment and management of vascular access device: Secondary | ICD-10-CM | POA: Diagnosis not present

## 2018-09-30 DIAGNOSIS — C189 Malignant neoplasm of colon, unspecified: Secondary | ICD-10-CM | POA: Diagnosis not present

## 2018-09-30 MED ORDER — HEPARIN SOD (PORK) LOCK FLUSH 100 UNIT/ML IV SOLN
500.0000 [IU] | Freq: Once | INTRAVENOUS | Status: AC | PRN
  Administered 2018-09-30: 500 [IU]

## 2018-09-30 MED ORDER — SODIUM CHLORIDE 0.9% FLUSH
10.0000 mL | INTRAVENOUS | Status: DC | PRN
  Administered 2018-09-30: 10 mL
  Filled 2018-09-30: qty 10

## 2018-09-30 NOTE — Progress Notes (Signed)
Patent tolerated chemotherapy with no complaints voiced.  Chemo pump disconnected with no complaints voiced.  Good blood return noted.  Band aid applied.  VSs with discharge and left ambulatory with no s/s of distress noted.

## 2018-10-01 DIAGNOSIS — C189 Malignant neoplasm of colon, unspecified: Secondary | ICD-10-CM | POA: Diagnosis not present

## 2018-10-09 ENCOUNTER — Ambulatory Visit (HOSPITAL_COMMUNITY): Admission: RE | Admit: 2018-10-09 | Payer: Medicare PPO | Source: Ambulatory Visit

## 2018-10-13 ENCOUNTER — Encounter (HOSPITAL_COMMUNITY): Payer: Self-pay | Admitting: Hematology

## 2018-10-13 ENCOUNTER — Inpatient Hospital Stay (HOSPITAL_BASED_OUTPATIENT_CLINIC_OR_DEPARTMENT_OTHER): Payer: Medicare PPO | Admitting: Hematology

## 2018-10-13 ENCOUNTER — Inpatient Hospital Stay (HOSPITAL_COMMUNITY): Payer: Medicare PPO

## 2018-10-13 ENCOUNTER — Other Ambulatory Visit: Payer: Self-pay

## 2018-10-13 VITALS — BP 119/77 | HR 64 | Temp 97.9°F | Resp 18

## 2018-10-13 VITALS — BP 125/77 | HR 75 | Temp 98.2°F | Resp 18 | Wt 199.8 lb

## 2018-10-13 DIAGNOSIS — Z87891 Personal history of nicotine dependence: Secondary | ICD-10-CM

## 2018-10-13 DIAGNOSIS — C189 Malignant neoplasm of colon, unspecified: Secondary | ICD-10-CM | POA: Diagnosis not present

## 2018-10-13 DIAGNOSIS — C787 Secondary malignant neoplasm of liver and intrahepatic bile duct: Secondary | ICD-10-CM

## 2018-10-13 DIAGNOSIS — I10 Essential (primary) hypertension: Secondary | ICD-10-CM

## 2018-10-13 DIAGNOSIS — Z5111 Encounter for antineoplastic chemotherapy: Secondary | ICD-10-CM | POA: Diagnosis not present

## 2018-10-13 DIAGNOSIS — C7800 Secondary malignant neoplasm of unspecified lung: Secondary | ICD-10-CM | POA: Diagnosis not present

## 2018-10-13 DIAGNOSIS — E1142 Type 2 diabetes mellitus with diabetic polyneuropathy: Secondary | ICD-10-CM | POA: Diagnosis not present

## 2018-10-13 DIAGNOSIS — Z452 Encounter for adjustment and management of vascular access device: Secondary | ICD-10-CM | POA: Diagnosis not present

## 2018-10-13 LAB — COMPREHENSIVE METABOLIC PANEL
ALT: 19 U/L (ref 0–44)
AST: 34 U/L (ref 15–41)
Albumin: 4 g/dL (ref 3.5–5.0)
Alkaline Phosphatase: 161 U/L — ABNORMAL HIGH (ref 38–126)
Anion gap: 12 (ref 5–15)
BUN: 19 mg/dL (ref 8–23)
CO2: 27 mmol/L (ref 22–32)
Calcium: 9.8 mg/dL (ref 8.9–10.3)
Chloride: 98 mmol/L (ref 98–111)
Creatinine, Ser: 1.07 mg/dL (ref 0.61–1.24)
GFR calc Af Amer: >60 mL/min (ref >60)
GFR calc non Af Amer: >60 mL/min (ref >60)
Glucose, Bld: 147 mg/dL — ABNORMAL HIGH (ref 70–99)
Potassium: 4.2 mmol/L (ref 3.5–5.1)
Sodium: 137 mmol/L (ref 135–145)
Total Bilirubin: 0.7 mg/dL (ref 0.3–1.2)
Total Protein: 8 g/dL (ref 6.5–8.1)

## 2018-10-13 LAB — CBC WITH DIFFERENTIAL/PLATELET
Abs Immature Granulocytes: 0.02 10*3/uL (ref 0.00–0.07)
Basophils Absolute: 0.1 10*3/uL (ref 0.0–0.1)
Basophils Relative: 1 %
Eosinophils Absolute: 0.1 10*3/uL (ref 0.0–0.5)
Eosinophils Relative: 2 %
HCT: 40.9 % (ref 39.0–52.0)
Hemoglobin: 13.2 g/dL (ref 13.0–17.0)
Immature Granulocytes: 0 %
Lymphocytes Relative: 27 %
Lymphs Abs: 1.7 10*3/uL (ref 0.7–4.0)
MCH: 30 pg (ref 26.0–34.0)
MCHC: 32.3 g/dL (ref 30.0–36.0)
MCV: 93 fL (ref 80.0–100.0)
Monocytes Absolute: 0.7 10*3/uL (ref 0.1–1.0)
Monocytes Relative: 11 %
Neutro Abs: 3.9 10*3/uL (ref 1.7–7.7)
Neutrophils Relative %: 59 %
Platelets: 146 10*3/uL — ABNORMAL LOW (ref 150–400)
RBC: 4.4 MIL/uL (ref 4.22–5.81)
RDW: 17.2 % — ABNORMAL HIGH (ref 11.5–15.5)
WBC: 6.5 10*3/uL (ref 4.0–10.5)
nRBC: 0 % (ref 0.0–0.2)

## 2018-10-13 MED ORDER — SODIUM CHLORIDE 0.9 % IV SOLN
1920.0000 mg/m2 | INTRAVENOUS | Status: DC
  Administered 2018-10-13: 4150 mg via INTRAVENOUS
  Filled 2018-10-13: qty 83

## 2018-10-13 MED ORDER — LEUCOVORIN CALCIUM INJECTION 350 MG
400.0000 mg/m2 | Freq: Once | INTRAVENOUS | Status: AC
  Administered 2018-10-13: 864 mg via INTRAVENOUS
  Filled 2018-10-13: qty 43.2

## 2018-10-13 MED ORDER — SODIUM CHLORIDE 0.9% FLUSH
10.0000 mL | INTRAVENOUS | Status: DC | PRN
  Administered 2018-10-13: 09:00:00 10 mL
  Filled 2018-10-13: qty 10

## 2018-10-13 MED ORDER — DEXTROSE 5 % IV SOLN
Freq: Once | INTRAVENOUS | Status: AC
  Administered 2018-10-13: 09:00:00 via INTRAVENOUS

## 2018-10-13 MED ORDER — OXALIPLATIN CHEMO INJECTION 100 MG/20ML
68.0000 mg/m2 | Freq: Once | INTRAVENOUS | Status: AC
  Administered 2018-10-13: 145 mg via INTRAVENOUS
  Filled 2018-10-13: qty 9

## 2018-10-13 MED ORDER — PALONOSETRON HCL INJECTION 0.25 MG/5ML
0.2500 mg | Freq: Once | INTRAVENOUS | Status: AC
  Administered 2018-10-13: 0.25 mg via INTRAVENOUS
  Filled 2018-10-13: qty 5

## 2018-10-13 MED ORDER — FLUOROURACIL CHEMO INJECTION 2.5 GM/50ML
320.0000 mg/m2 | Freq: Once | INTRAVENOUS | Status: AC
  Administered 2018-10-13: 700 mg via INTRAVENOUS
  Filled 2018-10-13: qty 14

## 2018-10-13 MED ORDER — SODIUM CHLORIDE 0.9 % IV SOLN
10.0000 mg | Freq: Once | INTRAVENOUS | Status: AC
  Administered 2018-10-13: 10 mg via INTRAVENOUS
  Filled 2018-10-13: qty 10

## 2018-10-13 NOTE — Patient Instructions (Addendum)
[facility] at [facility] Discharge Instructions  You were seen today by Dr. Katragadda. He went over your recent lab results. He will see you back in  for labs and follow up.   Thank you for choosing [facility] at [facility] to provide your oncology and hematology care.  To afford each patient quality time with our provider, please arrive at least 15 minutes before your scheduled appointment time.   If you have a lab appointment with the Cancer Center please come in thru the  Main Entrance and check in at the main information desk  You need to re-schedule your appointment should you arrive 10 or more minutes late.  We strive to give you quality time with our providers, and arriving late affects you and other patients whose appointments are after yours.  Also, if you no show three or more times for appointments you may be dismissed from the clinic at the providers discretion.     Again, thank you for choosing [facility].  Our hope is that these requests will decrease the amount of time that you wait before being seen by our physicians.       _____________________________________________________________  Should you have questions after your visit to [facility], please contact our office at [phone] between the hours of 8:00 a.m. and 4:30 p.m.  Voicemails left after 4:00 p.m. will not be returned until the following business day.  For prescription refill requests, have your pharmacy contact our office and allow 72 hours.    Cancer Center Support Programs:   > Cancer Support Group  2nd Tuesday of the month 1pm-2pm, Journey Room    

## 2018-10-13 NOTE — Progress Notes (Signed)
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 [facility])  05/28/2017 - 06/09/2018 Chemotherapy   The patient had palonosetron (ALOXI) injection 0.25 mg, 0.25 mg, Intravenous,  Once, 18 of 22 cycles Administration: 0.25 mg (09/03/2017), 0.25 mg (10/01/2017), 0.25 mg (10/15/2017), 0.25 mg (10/29/2017), 0.25 mg (09/17/2017), 0.25 mg (03/02/2018), 0.25 mg (03/16/2018), 0.25 mg (03/30/2018), 0.25 mg (04/13/2018), 0.25 mg (05/13/2018), 0.25 mg (05/27/2018) irinotecan (CAMPTOSAR) 400 mg in dextrose 5 % 500 mL chemo infusion, 180 mg/m2, Intravenous,  Once, 18 of 22 cycles Dose modification: 144 mg/m2 (Cycle 4, Reason: Provider Judgment) Administration: 320 mg (09/03/2017), 320 mg (10/01/2017), 320 mg (10/15/2017), 320 mg (10/29/2017), 320 mg (09/17/2017), 320 mg (03/02/2018), 320 mg (03/16/2018), 320 mg (03/30/2018), 320 mg (04/13/2018), 320 mg (05/13/2018), 320 mg (05/27/2018) leucovorin 888 mg in dextrose 5 % 250 mL infusion, 400 mg/m2, Intravenous,  Once, 18 of 22 cycles Administration: 900 mg (09/03/2017), 900 mg (10/01/2017), 900 mg (10/15/2017), 900 mg (10/29/2017), 900 mg (09/17/2017), 900 mg (03/02/2018), 900 mg (03/16/2018), 900 mg (03/30/2018), 900 mg (04/13/2018), 900 mg (05/13/2018), 900 mg (05/27/2018) fluorouracil (ADRUCIL) chemo injection 900 mg, 400 mg/m2, Intravenous,  Once, 18 of 22 cycles Dose modification: 320 mg/m2 (Cycle 4, Reason: Provider Judgment) Administration: 700 mg (09/03/2017), 700 mg (10/01/2017), 700 mg (10/15/2017), 700 mg (10/29/2017), 700 mg (09/17/2017), 700 mg (03/02/2018), 700 mg (03/16/2018), 700 mg (03/30/2018), 700 mg (04/13/2018), 700 mg (05/13/2018), 700 mg (05/27/2018) fluorouracil (ADRUCIL) 5,350 mg in sodium chloride 0.9 % 143  mL chemo infusion, 2,400 mg/m2, Intravenous, 1 Day/Dose, 18 of 22 cycles Dose modification: 1,920 mg/m2 (Cycle 4, Reason: Provider Judgment) Administration: 4,250 mg (09/03/2017), 4,250 mg (10/01/2017), 4,250 mg (10/15/2017), 4,250 mg (10/29/2017), 4,250 mg (09/17/2017), 4,250 mg (03/02/2018), 4,250 mg (03/16/2018), 4,250 mg (03/30/2018), 4,250 mg (04/13/2018), 4,250 mg (05/13/2018), 4,250 mg (05/27/2018)  for chemotherapy treatment.    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 ([place])   07/13/2018 -  Chemotherapy   The patient had palonosetron (ALOXI) injection 0.25 mg, 0.25 mg, Intravenous,  Once, 7 of 10 cycles Administration: 0.25 mg (07/13/2018), 0.25 mg (07/27/2018), 0.25 mg (08/10/2018), 0.25 mg (08/25/2018), 0.25 mg (09/09/2018), 0.25 mg (09/28/2018) leucovorin 864 mg in dextrose 5 % 250 mL infusion, 400 mg/m2 = 864 mg, Intravenous,  Once, 7 of 10 cycles Administration: 864 mg (07/13/2018), 864 mg (07/27/2018), 864 mg (08/10/2018), 864 mg (08/25/2018), 864 mg (09/09/2018), 864 mg (09/28/2018) oxaliplatin (ELOXATIN) 145 mg in dextrose 5 % 500 mL chemo infusion, 68 mg/m2 = 145 mg (80 % of original dose 85 mg/m2), Intravenous,  Once, 7 of 10 cycles Dose modification: 68 mg/m2 (80 % of original dose 85 mg/m2, Cycle 1, Reason: Other (see comments), Comment: Pre-existing neuropathy) Administration: 145 mg (07/13/2018), 145 mg (07/27/2018), 145 mg (08/10/2018), 145 mg (08/25/2018), 145 mg (09/09/2018), 145 mg (09/28/2018) fluorouracil (ADRUCIL) chemo injection 700 mg, 320 mg/m2 = 700 mg (80 % of original dose 400 mg/m2), Intravenous,  Once, 7 of 10 cycles Dose modification: 320 mg/m2 (80 % of original dose 400 mg/m2, Cycle 1, Reason: Provider Judgment) Administration: 700 mg (07/13/2018), 700 mg (07/27/2018), 700 mg (08/10/2018), 700 mg (08/25/2018), 700 mg (09/09/2018), 700 mg (09/28/2018) fluorouracil (ADRUCIL) 4,150 mg in sodium chloride  0.9 % 67 mL chemo infusion, 1,920 mg/m2 = 4,150 mg (80 % of original  dose 2,400 mg/m2), Intravenous, 1 Day/Dose, 7 of 10 cycles Dose modification: 1,920 mg/m2 (80 % of original dose 2,400 mg/m2, Cycle 1, Reason: Provider Judgment), 1,920 mg/m2 (original dose 2,400 mg/m2, Cycle 4, Reason: Provider Judgment) Administration: 4,150 mg (07/13/2018), 4,150 mg (08/10/2018), 4,150 mg (07/27/2018), 4,150 mg (08/25/2018), 4,150 mg (09/09/2018), 4,150 mg (09/28/2018)  for chemotherapy treatment.       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 68 y.o. male seen for follow-up of under consideration of next cycle of chemotherapy for his metastatic colon cancer.  After last cycle, he had experienced nausea and vomited once.  Compazine is helping.  He also had constipation.  Numbness in the feet has been stable.  Denies any fevers or chills or infections.  He is continuing to work part-time job.  Appetite is 100% and energy levels are 75%.        REVIEW OF SYSTEMS:  Review of Systems  Constitutional: Negative for fatigue.  Gastrointestinal: Positive for constipation and vomiting.  Neurological: Positive for numbness.  All other systems reviewed and are negative.    PAST MEDICAL/SURGICAL HISTORY:  Past Medical History:  Diagnosis Date  . Colon cancer ([place])   . Diabetes ([place])   . GERD (gastroesophageal reflux disease)   . Hypertension    Past Surgical History:  Procedure Laterality Date  . APPENDECTOMY     during colon surgery  . COLON SURGERY    . IR RADIOLOGIST EVAL & MGMT  11/26/2017  . RADIOFREQUENCY ABLATION N/A 12/10/2017   Procedure: CT MICROWAVE THERMAL ABLATION (LIVER);  Surgeon: Markus Daft, MD;  Location: [facility] ORS;  Service: Anesthesiology;  Laterality: N/A;  . SHOULDER SURGERY  2003     SOCIAL HISTORY:  Social History    Socioeconomic History  . Marital status: Married    Spouse name: Not on file  . Number of children: Not on file  . Years of education: Not on file  . Highest education level: Not on file  Occupational History  . Not on file  Social Needs  . Financial resource strain: Not on file  . Food insecurity    Worry: Not on file    Inability: Not on file  . Transportation needs    Medical: Not on file    Non-medical: Not on file  Tobacco Use  . Smoking status: Former Smoker    Quit date: 04/05/1995    Years since quitting: 23.5  . Smokeless tobacco: Never Used  Substance and Sexual Activity  . Alcohol use: Not Currently    Alcohol/week: 0.0 standard drinks    Comment: used to drink but stopped about 20 years ago  . Drug use: Never  . Sexual activity: Not on file  Lifestyle  . Physical activity    Days per week: Not on file    Minutes per session: Not on file  . Stress: Not on file  Relationships  . Social Herbalist on phone: Not on file    Gets together: Not on file    Attends religious service: Not on file    Active member of club or organization: Not on file    Attends meetings of clubs or organizations:  Not on file    Relationship status: Not on file  . Intimate partner violence    Fear of current or ex partner: Not on file    Emotionally abused: Not on file    Physically abused: Not on file    Forced sexual activity: Not on file  Other Topics Concern  . Not on file  Social History Narrative  . Not on file    FAMILY HISTORY:  Family History  Problem Relation Age of Onset  . Diabetes Mother   . Cancer Father   . Bone cancer Father   . Colon cancer Neg Hx   . Colon polyps Neg Hx     CURRENT MEDICATIONS:  Outpatient Encounter Medications as of 10/13/2018  Medication Sig  . Alpha-Lipoic Acid 200 MG CAPS Take 1 capsule by mouth daily.  . Ascorbic Acid (VITAMIN C) 1000 MG tablet Take 2,000 mg by mouth daily.   . ASHWAGANDHA PO Take 800 mg by mouth daily.   Marland Kitchen aspirin 81 MG tablet Take 81 mg by mouth daily.  Marland Kitchen atorvastatin (LIPITOR) 10 MG tablet Take 10 mg by mouth daily.   Marland Kitchen BEE POLLEN PO Take 1 capsule by mouth daily.   . Blood Glucose Monitoring Suppl (ONE TOUCH ULTRA MINI) W/DEVICE KIT USE TO CHECK BLOOD SUGAR DAILY.  . cholecalciferol (VITAMIN D) 1000 units tablet Take 5,000 Units by mouth daily.   Marland Kitchen CINNAMON PO Take 2,000 mg by mouth daily.   Marland Kitchen Cod Liver Oil CAPS Take 1 capsule by mouth daily.  . Coenzyme Q10 (CO Q 10) 100 MG CAPS Take 1 capsule by mouth daily.   . Cyanocobalamin (VITAMIN B-12) 5000 MCG TBDP Take 5,000 mcg by mouth daily.  . cyclobenzaprine (FLEXERIL) 10 MG tablet TAKE ONE TABLET BY MOUTH EVERY EVENING AT BEDTIME AS NEEDED FOR MUSCLE SPASMS.  Marland Kitchen diphenoxylate-atropine (LOMOTIL) 2.5-0.025 MG tablet Take 1 tablet by mouth every 6 (six) hours as needed for diarrhea or loose stools (stomach cramps).  . Flax Oil-Fish Oil-Borage Oil (FISH OIL-FLAX OIL-BORAGE OIL) CAPS Take 1 capsule by mouth daily.  . fluorouracil CALGB 86754 in sodium chloride 0.9 % 150 mL Inject into the vein over 96 hr.  . Garlic 4920 MG CAPS Take 2 capsules by mouth daily.   Marland Kitchen glipiZIDE (GLUCOTROL XL) 10 MG 24 hr tablet Take 10 mg by mouth daily.   . Glucosamine-Chondroit-Collagen ([employer] GLUCO-CHONDROIT PLUS UC-II PO) Take 1 tablet by mouth daily.   . IRINOTECAN HCL IV Inject into the vein every 14 (fourteen) days.   Marland Kitchen LECITHIN PO Take 1 capsule by mouth daily. 1200 mg  . LEUCOVORIN CALCIUM IV Inject into the vein every 14 (fourteen) days.   Marland Kitchen lidocaine-prilocaine (EMLA) cream Apply small amount over port site and cover with plastic wrap one hour prior to appointment.  . magnesium oxide (MAG-OX) 400 MG tablet Take 400 mg by mouth daily.   . metFORMIN (GLUCOPHAGE) 500 MG tablet Take 1 tablet (500 mg total) by mouth 2 (two) times daily with a meal. (Patient taking differently: Take 1,000 mg by mouth 2 (two) times daily with a meal. )  . milk thistle 175 MG tablet  Take 175 mg by mouth daily.  Marland Kitchen OVER THE COUNTER MEDICATION Take 1 capsule by mouth daily. Mushroom Complex 750 mg  . OVER THE COUNTER MEDICATION Take 1 capsule by mouth daily. Cura Med 750 mg  . oxyCODONE 10 MG TABS Take 1 tablet (10 mg total) by mouth 2 (two) times daily  as needed for severe pain.  Marland Kitchen prochlorperazine (COMPAZINE) 10 MG tablet Take 1 tablet (10 mg total) by mouth every 6 (six) hours as needed for nausea or vomiting.  . valsartan-hydrochlorothiazide (DIOVAN-HCT) 160-25 MG tablet Take 1 tablet by mouth daily.   Facility-Administered Encounter Medications as of 10/13/2018  Medication  . sodium chloride flush (NS) 0.9 % injection 10 mL    ALLERGIES:  No Known Allergies   PHYSICAL EXAM:  ECOG Performance status: 1  Vitals:   10/13/18 0822  BP: 125/77  Pulse: 75  Resp: 18  Temp: 98.2 F (36.8 C)  SpO2: 97%   Filed Weights   10/13/18 0822  Weight: 199 lb 12.8 oz (90.6 kg)    Physical Exam Vitals signs reviewed.  Constitutional:      Appearance: Normal appearance.  Cardiovascular:     Rate and Rhythm: Normal rate and regular rhythm.     Heart sounds: Normal heart sounds.  Pulmonary:     Effort: Pulmonary effort is normal.     Breath sounds: Normal breath sounds.  Abdominal:     General: There is no distension.     Palpations: Abdomen is soft. There is no mass.  Musculoskeletal:        General: No swelling.  Skin:    General: Skin is warm.  Neurological:     General: No focal deficit present.     Mental Status: He is alert and oriented to person, place, and time.  Psychiatric:        Mood and Affect: Mood normal.        Behavior: Behavior normal.      LABORATORY DATA:  I have reviewed the labs as listed.  CBC    Component Value Date/Time   WBC 6.5 10/13/2018 0759   RBC 4.40 10/13/2018 0759   HGB 13.2 10/13/2018 0759   HCT 40.9 10/13/2018 0759   PLT 146 (L) 10/13/2018 0759   MCV 93.0 10/13/2018 0759   MCH 30.0 10/13/2018 0759   MCHC 32.3  10/13/2018 0759   RDW 17.2 (H) 10/13/2018 0759   LYMPHSABS 1.7 10/13/2018 0759   MONOABS 0.7 10/13/2018 0759   EOSABS 0.1 10/13/2018 0759   BASOSABS 0.1 10/13/2018 0759   CMP Latest Ref Rng & Units 10/13/2018 09/28/2018 09/09/2018  Glucose 70 - 99 mg/dL 147(H) 167(H) 134(H)  BUN 8 - 23 mg/dL _0 Creatinine 0.61 - 1.24 mg/dL 1.07 0.98 0.97  Sodium 135 - 145 mmol/L 137 138 139  Potassium 3.5 - 5.1 mmol/L 4.2 4.7 4.3  Chloride 98 - 111 mmol/L 98 95(L) 101  CO2 22 - 32 mmol/L _1 Calcium 8.9 - 10.3 mg/dL 9.8 9.8 9.6  Total Protein 6.5 - 8.1 g/dL 8.0 8.2(H) 7.6  Total Bilirubin 0.3 - 1.2 mg/dL 0.7 0.6 0.5  Alkaline Phos 38 - 126 U/L 161(H) 169(H) 136(H)  AST 15 - 41 U/L 34 33 30  ALT 0 - 44 U/L _2 DIAGNOSTIC IMAGING:  I have independently reviewed the scans and discussed with the patient.   I have reviewed Venita Lick LPN's note and agree with the documentation.  I personally performed a face-to-face visit, made revisions and my assessment and plan is as follows.    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 - He has completed 12 cycles of FOLFIRI on 10/29/2017.  He tolerated it very well.  CT CAP showed stable left upper lobe subcentimeter lung nodule, segment 4 lesion in the liver has decreased to 2.4 cm.  -He underwent left hepatic lobe microwave ablation on 12/10/2017.  He did very well with minimal pain and complications.  He is able to do all his day-to-day activities without any problems. -CT scan of the abdomen pelvis on 02/05/2018 showed left hepatic lesion with central high attenuation and a large area of low-attenuation.  There is a new left hepatic lobe  lesion measuring 1.3 cm.  CT chest showed similar appearing small nodules. - MRI of the liver dated 02/25/2018 shows heterogeneous mass which surrounds the right side of the ablation defect and extends inferiorly in segment 4B measuring 6.9 x 5.6 cm, segment 2 lesion measuring 2.6 cm, segment 8 subcapsular 1.6 cm lesion.  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 - He has at least 3 new lesions and questionable capsular metastasis.  I do not believe he is a candidate for local therapy.  I have recommended going back on chemotherapy.  His last chemo was in July 2019, dose reduced FOLFIRI. - 6 cycles of FOLFIRI from 03/02/2018 through 05/27/2018. - CT CAP on 06/16/2018 shows progression of pulmonary nodules bilaterally, largest left upper lobe pulmonary nodule measuring 1.9 cm and right upper lobe pulmonary nodule 1.8 cm.  Right cardiophrenic angle adenopathy is new at 1.3 cm.  2 liver lesions, largest measuring 7.4 x 6.4 cm, a second largest measuring 5.5 x 4.4 cm.  Both have increased in size.  Development of extensive omental/peritoneal metastasis.  An index right abdominal omental implant measures 2.2 x 3.3 cm.  No bone metastasis. - 6 cycles of FOLFOX (20% dose reduction) from 07/13/2018 through 09/28/2018. -He is not a candidate for Avastin because of prior history of bleeding.  He is not a candidate for EGFR therapy. -Right upper quadrant pain improved after cycle 1.  Is tolerating chemotherapy reasonably well. -He missed his CT scan after cycle 6.  His blood counts are adequate to proceed with next cycle of chemotherapy. -We will proceed with cycle 7 today.  I will see him back in 2 weeks for follow-up.  I plan to repeat CT scans prior to his visit. - His CEA has improved to 148 on 09/28/2018 from 118 prior to start of therapy.  2.  Peripheral neuropathy: -This is from diabetes.  Mild numbness in the feet predominantly at bedtime was present prior to  start of therapy. - This has been stable since chemotherapy.  3.  Hypertension: -Blood pressures well controlled today.  He will continue valsartan HCTZ.    Total time spent is 25 minutes with more than 50% of the time spent face-to-face discussing treatment plan and coordination of care.  Orders placed this encounter:  Orders Placed This Encounter  Procedures  . CBC with Differential/Platelet  . Comprehensive metabolic panel  . CEA      Derek Jack, MD [place] [phone]

## 2018-10-13 NOTE — Patient Instructions (Signed)
[facility] Discharge Instructions for Patients Receiving Chemotherapy   Beginning January 23rd 2017 lab work for the Cancer Center will be done in the  Main lab at [place] on 1st floor. If you have a lab appointment with the Cancer Center please come in thru the  Main Entrance and check in at the main information desk   Today you received the following chemotherapy agents Oxaliplatin,Leucovorin and 5FU. Follow-up as scheduled. Call clinic for any questions or concerns  To help prevent nausea and vomiting after your treatment, we encourage you to take your nausea medication   If you develop nausea and vomiting, or diarrhea that is not controlled by your medication, call the clinic.  The clinic phone number is [phone]. Office hours are Monday-Friday 8:30am-5:00pm.  BELOW ARE SYMPTOMS THAT SHOULD BE REPORTED IMMEDIATELY:  *FEVER GREATER THAN 101.0 F  *CHILLS WITH OR WITHOUT FEVER  NAUSEA AND VOMITING THAT IS NOT CONTROLLED WITH YOUR NAUSEA MEDICATION  *UNUSUAL SHORTNESS OF BREATH  *UNUSUAL BRUISING OR BLEEDING  TENDERNESS IN MOUTH AND THROAT WITH OR WITHOUT PRESENCE OF ULCERS  *URINARY PROBLEMS  *BOWEL PROBLEMS  UNUSUAL RASH Items with * indicate a potential emergency and should be followed up as soon as possible. If you have an emergency after office hours please contact your primary care physician or go to the nearest emergency department.  Please call the clinic during office hours if you have any questions or concerns.   You may also contact the Patient Navigator at [phone] should you have any questions or need assistance in obtaining follow up care.      Resources For Cancer Patients and their Caregivers ? American Cancer Society: Can assist with transportation, wigs, general needs, runs Look Good Feel Better.        1-888-227-6333 ? Cancer Care: Provides financial assistance, online support groups, medication/co-pay assistance.   1-800-813-HOPE (4673) ? [facility] Assists [place] cancer patients and their families through emotional , educational and financial support.  336-427-4357 ? [place] DSS Where to apply for food stamps, Medicaid and utility assistance. 336-342-1394 ? [facility]: Transportation to medical appointments. 336-347-2287 ? Social Security Administration: May apply for disability if have a Stage IV cancer. 336-342-7796 1-800-772-1213 ? [place] Aging, Disability and Transit Services: Assists with nutrition, care and transit needs. 336-349-2343         

## 2018-10-13 NOTE — Assessment & Plan Note (Addendum)
1.  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 - He has completed 12 cycles of FOLFIRI on 10/29/2017.  He tolerated it very well.  CT CAP showed stable left upper lobe subcentimeter lung nodule, segment 4 lesion in the liver has decreased to 2.4 cm.  -He underwent left hepatic lobe microwave ablation on 12/10/2017.  He did very well with minimal pain and complications.  He is able to do all his day-to-day activities without any problems. -CT scan of the abdomen pelvis on 02/05/2018 showed left hepatic lesion with central high attenuation and a large area of low-attenuation.  There is a new left hepatic lobe lesion measuring 1.3 cm.  CT chest showed similar appearing small nodules. - MRI of the liver dated 02/25/2018 shows heterogeneous mass which surrounds the right side of the ablation defect and extends inferiorly in segment 4B measuring 6.9 x 5.6 cm, segment 2 lesion measuring 2.6 cm, segment 8 subcapsular 1.6 cm lesion.  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 - He has at least 3 new lesions and questionable capsular metastasis.  I do not believe he is a candidate for local therapy.  I have recommended going back on chemotherapy.  His last chemo was in July 2019, dose reduced FOLFIRI. - 6 cycles of FOLFIRI from 03/02/2018 through 05/27/2018. - CT CAP on 06/16/2018 shows progression of pulmonary nodules bilaterally, largest left upper lobe pulmonary nodule measuring 1.9 cm and right upper lobe pulmonary nodule 1.8 cm.  Right cardiophrenic angle adenopathy is new at 1.3 cm.  2 liver lesions, largest measuring 7.4 x 6.4 cm, a second largest measuring 5.5 x 4.4 cm.  Both have increased in size.  Development of extensive omental/peritoneal  metastasis.  An index right abdominal omental implant measures 2.2 x 3.3 cm.  No bone metastasis. - 6 cycles of FOLFOX (20% dose reduction) from 07/13/2018 through 09/28/2018. -He is not a candidate for Avastin because of prior history of bleeding.  He is not a candidate for EGFR therapy. -Right upper quadrant pain improved after cycle 1.  Is tolerating chemotherapy reasonably well. -He missed his CT scan after cycle 6.  His blood counts are adequate to proceed with next cycle of chemotherapy. -We will proceed with cycle 7 today.  I will see him back in 2 weeks for follow-up.  I plan to repeat CT scans prior to his visit. - His CEA has improved to 148 on 09/28/2018 from 118 prior to start of therapy.  2.  Peripheral neuropathy: -This is from diabetes.  Mild numbness in the feet predominantly at bedtime was present prior to start of therapy. - This has been stable since chemotherapy.  3.  Hypertension: -Blood pressures well controlled today.  He will continue valsartan HCTZ.

## 2018-10-13 NOTE — Progress Notes (Signed)
0835 Labs reviewed with and pt seen by Dr. Delton Coombes and pt approved for chemo tx today per MD                            Christopher Friedman tolerated chemo tx well without complaints or incident. Pt discharged with 5FU pump infusing without issues. VSS upon discharge. Pt discharged self ambulatory in satisfactory condition

## 2018-10-14 LAB — CEA: CEA: 209 ng/mL — ABNORMAL HIGH (ref 0.0–4.7)

## 2018-10-15 ENCOUNTER — Inpatient Hospital Stay (HOSPITAL_COMMUNITY): Payer: Medicare PPO

## 2018-10-15 ENCOUNTER — Other Ambulatory Visit: Payer: Self-pay

## 2018-10-15 DIAGNOSIS — C7800 Secondary malignant neoplasm of unspecified lung: Secondary | ICD-10-CM | POA: Diagnosis not present

## 2018-10-15 DIAGNOSIS — C189 Malignant neoplasm of colon, unspecified: Secondary | ICD-10-CM | POA: Diagnosis not present

## 2018-10-15 DIAGNOSIS — C787 Secondary malignant neoplasm of liver and intrahepatic bile duct: Secondary | ICD-10-CM | POA: Diagnosis not present

## 2018-10-15 DIAGNOSIS — Z5111 Encounter for antineoplastic chemotherapy: Secondary | ICD-10-CM | POA: Diagnosis not present

## 2018-10-15 DIAGNOSIS — Z452 Encounter for adjustment and management of vascular access device: Secondary | ICD-10-CM | POA: Diagnosis not present

## 2018-10-15 MED ORDER — SODIUM CHLORIDE 0.9% FLUSH
10.0000 mL | INTRAVENOUS | Status: DC | PRN
  Administered 2018-10-15: 10 mL
  Filled 2018-10-15: qty 10

## 2018-10-15 MED ORDER — HEPARIN SOD (PORK) LOCK FLUSH 100 UNIT/ML IV SOLN
500.0000 [IU] | Freq: Once | INTRAVENOUS | Status: AC | PRN
  Administered 2018-10-15: 500 [IU]

## 2018-10-15 NOTE — Progress Notes (Signed)
Christopher Friedman returns today for port de access and flush after 46 hr continous infusion of 35fu. Tolerated infusion without problems. Portacath located right chest wall was  deaccessed and flushed with 5ml NS and 500U/22ml Heparin and needle removed intact.  Procedure without incident. Patient tolerated procedure well.

## 2018-10-15 NOTE — Patient Instructions (Signed)
[facility] _______________________________________________________________  Thank you for choosing [facility] to provide your oncology and hematology care.  To afford each patient quality time with our providers, please arrive at least 15 minutes before your scheduled appointment.  You need to re-schedule your appointment if you arrive 10 or more minutes late.  We strive to give you quality time with our providers, and arriving late affects you and other patients whose appointments are after yours.  Also, if you no show three or more times for appointments you may be dismissed from the clinic.  Again, thank you for choosing [facility]. Our hope is that these requests will allow you access to exceptional care and in a timely manner. _______________________________________________________________  If you have questions after your visit, please contact our office at [phone] between the hours of 8:30 a.m. and 5:00 p.m. Voicemails left after 4:30 p.m. will not be returned until the following business day. _______________________________________________________________  For prescription refill requests, have your pharmacy contact our office. _______________________________________________________________  Recommendations made by the consultant and any test results will be sent to your referring physician. _______________________________________________________________ 

## 2018-10-21 ENCOUNTER — Other Ambulatory Visit: Payer: Self-pay

## 2018-10-21 ENCOUNTER — Ambulatory Visit (HOSPITAL_COMMUNITY)
Admission: RE | Admit: 2018-10-21 | Discharge: 2018-10-21 | Disposition: A | Discharge status: Home / Self Care | Payer: Medicare PPO | Source: Ambulatory Visit | Attending: Hematology | Admitting: Hematology

## 2018-10-21 DIAGNOSIS — C787 Secondary malignant neoplasm of liver and intrahepatic bile duct: Secondary | ICD-10-CM

## 2018-10-21 DIAGNOSIS — Z7984 Long term (current) use of oral hypoglycemic drugs: Secondary | ICD-10-CM | POA: Diagnosis not present

## 2018-10-21 DIAGNOSIS — Z79891 Long term (current) use of opiate analgesic: Secondary | ICD-10-CM | POA: Diagnosis not present

## 2018-10-21 DIAGNOSIS — E119 Type 2 diabetes mellitus without complications: Secondary | ICD-10-CM | POA: Diagnosis present

## 2018-10-21 DIAGNOSIS — Z85830 Personal history of malignant neoplasm of bone: Secondary | ICD-10-CM | POA: Diagnosis not present

## 2018-10-21 DIAGNOSIS — K219 Gastro-esophageal reflux disease without esophagitis: Secondary | ICD-10-CM | POA: Diagnosis present

## 2018-10-21 DIAGNOSIS — A419 Sepsis, unspecified organism: Secondary | ICD-10-CM | POA: Diagnosis not present

## 2018-10-21 DIAGNOSIS — C189 Malignant neoplasm of colon, unspecified: Secondary | ICD-10-CM | POA: Diagnosis not present

## 2018-10-21 DIAGNOSIS — Z833 Family history of diabetes mellitus: Secondary | ICD-10-CM | POA: Diagnosis not present

## 2018-10-21 DIAGNOSIS — N3000 Acute cystitis without hematuria: Secondary | ICD-10-CM | POA: Diagnosis present

## 2018-10-21 DIAGNOSIS — C7800 Secondary malignant neoplasm of unspecified lung: Secondary | ICD-10-CM | POA: Diagnosis present

## 2018-10-21 DIAGNOSIS — E1169 Type 2 diabetes mellitus with other specified complication: Secondary | ICD-10-CM | POA: Diagnosis not present

## 2018-10-21 DIAGNOSIS — R6520 Severe sepsis without septic shock: Secondary | ICD-10-CM | POA: Diagnosis present

## 2018-10-21 DIAGNOSIS — Z79899 Other long term (current) drug therapy: Secondary | ICD-10-CM | POA: Diagnosis not present

## 2018-10-21 DIAGNOSIS — Z20828 Contact with and (suspected) exposure to other viral communicable diseases: Secondary | ICD-10-CM | POA: Diagnosis present

## 2018-10-21 DIAGNOSIS — Z85038 Personal history of other malignant neoplasm of large intestine: Secondary | ICD-10-CM | POA: Diagnosis not present

## 2018-10-21 DIAGNOSIS — R0602 Shortness of breath: Secondary | ICD-10-CM | POA: Diagnosis present

## 2018-10-21 DIAGNOSIS — K7689 Other specified diseases of liver: Secondary | ICD-10-CM | POA: Diagnosis not present

## 2018-10-21 DIAGNOSIS — N179 Acute kidney failure, unspecified: Secondary | ICD-10-CM | POA: Diagnosis present

## 2018-10-21 DIAGNOSIS — Z87891 Personal history of nicotine dependence: Secondary | ICD-10-CM | POA: Diagnosis not present

## 2018-10-21 DIAGNOSIS — Z7982 Long term (current) use of aspirin: Secondary | ICD-10-CM | POA: Diagnosis not present

## 2018-10-21 DIAGNOSIS — I10 Essential (primary) hypertension: Secondary | ICD-10-CM | POA: Diagnosis present

## 2018-10-21 DIAGNOSIS — R918 Other nonspecific abnormal finding of lung field: Secondary | ICD-10-CM | POA: Diagnosis not present

## 2018-10-21 DIAGNOSIS — A4151 Sepsis due to Escherichia coli [E. coli]: Secondary | ICD-10-CM | POA: Diagnosis present

## 2018-10-21 MED ORDER — IOHEXOL 300 MG/ML  SOLN
100.0000 mL | Freq: Once | INTRAMUSCULAR | Status: AC | PRN
  Administered 2018-10-21: 100 mL via INTRAVENOUS

## 2018-10-24 ENCOUNTER — Other Ambulatory Visit: Payer: Self-pay

## 2018-10-24 ENCOUNTER — Encounter (HOSPITAL_COMMUNITY): Payer: Self-pay | Admitting: Emergency Medicine

## 2018-10-24 ENCOUNTER — Emergency Department (HOSPITAL_COMMUNITY): Payer: Medicare PPO

## 2018-10-24 ENCOUNTER — Inpatient Hospital Stay (HOSPITAL_COMMUNITY)
Admission: EM | Admit: 2018-10-24 | Discharge: 2018-10-27 | DRG: 872 | Disposition: A | Discharge status: Home / Self Care | Payer: Medicare PPO | Attending: Family Medicine | Admitting: Family Medicine

## 2018-10-24 DIAGNOSIS — C787 Secondary malignant neoplasm of liver and intrahepatic bile duct: Secondary | ICD-10-CM | POA: Diagnosis present

## 2018-10-24 DIAGNOSIS — E1169 Type 2 diabetes mellitus with other specified complication: Secondary | ICD-10-CM | POA: Diagnosis not present

## 2018-10-24 DIAGNOSIS — Z85038 Personal history of other malignant neoplasm of large intestine: Secondary | ICD-10-CM | POA: Diagnosis not present

## 2018-10-24 DIAGNOSIS — Z79899 Other long term (current) drug therapy: Secondary | ICD-10-CM

## 2018-10-24 DIAGNOSIS — R6520 Severe sepsis without septic shock: Secondary | ICD-10-CM | POA: Diagnosis present

## 2018-10-24 DIAGNOSIS — Z87891 Personal history of nicotine dependence: Secondary | ICD-10-CM | POA: Diagnosis not present

## 2018-10-24 DIAGNOSIS — Z7984 Long term (current) use of oral hypoglycemic drugs: Secondary | ICD-10-CM

## 2018-10-24 DIAGNOSIS — C189 Malignant neoplasm of colon, unspecified: Secondary | ICD-10-CM | POA: Diagnosis present

## 2018-10-24 DIAGNOSIS — N179 Acute kidney failure, unspecified: Secondary | ICD-10-CM | POA: Diagnosis present

## 2018-10-24 DIAGNOSIS — C7800 Secondary malignant neoplasm of unspecified lung: Secondary | ICD-10-CM | POA: Diagnosis present

## 2018-10-24 DIAGNOSIS — Z85830 Personal history of malignant neoplasm of bone: Secondary | ICD-10-CM | POA: Diagnosis not present

## 2018-10-24 DIAGNOSIS — A419 Sepsis, unspecified organism: Secondary | ICD-10-CM | POA: Diagnosis not present

## 2018-10-24 DIAGNOSIS — E119 Type 2 diabetes mellitus without complications: Secondary | ICD-10-CM | POA: Diagnosis present

## 2018-10-24 DIAGNOSIS — Z833 Family history of diabetes mellitus: Secondary | ICD-10-CM

## 2018-10-24 DIAGNOSIS — Z20828 Contact with and (suspected) exposure to other viral communicable diseases: Secondary | ICD-10-CM | POA: Diagnosis present

## 2018-10-24 DIAGNOSIS — Z79891 Long term (current) use of opiate analgesic: Secondary | ICD-10-CM | POA: Diagnosis not present

## 2018-10-24 DIAGNOSIS — K219 Gastro-esophageal reflux disease without esophagitis: Secondary | ICD-10-CM | POA: Diagnosis present

## 2018-10-24 DIAGNOSIS — I10 Essential (primary) hypertension: Secondary | ICD-10-CM | POA: Diagnosis present

## 2018-10-24 DIAGNOSIS — A4151 Sepsis due to Escherichia coli [E. coli]: Secondary | ICD-10-CM | POA: Diagnosis present

## 2018-10-24 DIAGNOSIS — Z7982 Long term (current) use of aspirin: Secondary | ICD-10-CM | POA: Diagnosis not present

## 2018-10-24 DIAGNOSIS — N3000 Acute cystitis without hematuria: Secondary | ICD-10-CM | POA: Diagnosis present

## 2018-10-24 DIAGNOSIS — N390 Urinary tract infection, site not specified: Secondary | ICD-10-CM | POA: Diagnosis present

## 2018-10-24 DIAGNOSIS — R0602 Shortness of breath: Secondary | ICD-10-CM | POA: Diagnosis present

## 2018-10-24 LAB — CBC WITH DIFFERENTIAL/PLATELET
Abs Immature Granulocytes: 0.06 10*3/uL (ref 0.00–0.07)
Basophils Absolute: 0 10*3/uL (ref 0.0–0.1)
Basophils Relative: 0 %
Eosinophils Absolute: 0 10*3/uL (ref 0.0–0.5)
Eosinophils Relative: 0 %
HCT: 35 % — ABNORMAL LOW (ref 39.0–52.0)
Hemoglobin: 11.7 g/dL — ABNORMAL LOW (ref 13.0–17.0)
Immature Granulocytes: 0 %
Lymphocytes Relative: 7 %
Lymphs Abs: 0.9 10*3/uL (ref 0.7–4.0)
MCH: 30.3 pg (ref 26.0–34.0)
MCHC: 33.4 g/dL (ref 30.0–36.0)
MCV: 90.7 fL (ref 80.0–100.0)
Monocytes Absolute: 0.9 10*3/uL (ref 0.1–1.0)
Monocytes Relative: 7 %
Neutro Abs: 11.6 10*3/uL — ABNORMAL HIGH (ref 1.7–7.7)
Neutrophils Relative %: 86 %
Platelets: 142 10*3/uL — ABNORMAL LOW (ref 150–400)
RBC: 3.86 MIL/uL — ABNORMAL LOW (ref 4.22–5.81)
RDW: 17.2 % — ABNORMAL HIGH (ref 11.5–15.5)
WBC: 13.5 10*3/uL — ABNORMAL HIGH (ref 4.0–10.5)
nRBC: 0 % (ref 0.0–0.2)

## 2018-10-24 LAB — COMPREHENSIVE METABOLIC PANEL
ALT: 18 U/L (ref 0–44)
AST: 27 U/L (ref 15–41)
Albumin: 3.7 g/dL (ref 3.5–5.0)
Alkaline Phosphatase: 166 U/L — ABNORMAL HIGH (ref 38–126)
Anion gap: 13 (ref 5–15)
BUN: 18 mg/dL (ref 8–23)
CO2: 22 mmol/L (ref 22–32)
Calcium: 9 mg/dL (ref 8.9–10.3)
Chloride: 97 mmol/L — ABNORMAL LOW (ref 98–111)
Creatinine, Ser: 1.27 mg/dL — ABNORMAL HIGH (ref 0.61–1.24)
GFR calc Af Amer: >60 mL/min (ref >60)
GFR calc non Af Amer: 58 mL/min — ABNORMAL LOW (ref >60)
Glucose, Bld: 192 mg/dL — ABNORMAL HIGH (ref 70–99)
Potassium: 3.9 mmol/L (ref 3.5–5.1)
Sodium: 132 mmol/L — ABNORMAL LOW (ref 135–145)
Total Bilirubin: 0.9 mg/dL (ref 0.3–1.2)
Total Protein: 7.2 g/dL (ref 6.5–8.1)

## 2018-10-24 LAB — URINALYSIS, ROUTINE W REFLEX MICROSCOPIC
Bilirubin Urine: NEGATIVE
Glucose, UA: NEGATIVE mg/dL
Hgb urine dipstick: NEGATIVE
Ketones, ur: 5 mg/dL — AB
Nitrite: NEGATIVE
Protein, ur: 30 mg/dL — AB
Specific Gravity, Urine: 1.029 (ref 1.005–1.030)
WBC, UA: >50 WBC/hpf — ABNORMAL HIGH (ref 0–5)
pH: 5 (ref 5.0–8.0)

## 2018-10-24 LAB — SARS CORONAVIRUS 2 BY RT PCR (HOSPITAL ORDER, PERFORMED IN ~~LOC~~ HOSPITAL LAB): SARS Coronavirus 2: NEGATIVE

## 2018-10-24 LAB — LACTIC ACID, PLASMA
Lactic Acid, Venous: 2.7 mmol/L (ref 0.5–1.9)
Lactic Acid, Venous: 1.2 mmol/L (ref 0.5–1.9)

## 2018-10-24 LAB — PROTIME-INR
INR: 1.2 (ref 0.8–1.2)
Prothrombin Time: 14.7 seconds (ref 11.4–15.2)

## 2018-10-24 LAB — GLUCOSE, CAPILLARY: Glucose-Capillary: 90 mg/dL (ref 70–99)

## 2018-10-24 MED ORDER — ONDANSETRON HCL 4 MG PO TABS: 4.0000 mg | ORAL_TABLET | Freq: Four times a day (QID) | ORAL | Status: DC | PRN

## 2018-10-24 MED ORDER — ONDANSETRON HCL 4 MG/2ML IJ SOLN: 4.0000 mg | Freq: Four times a day (QID) | INTRAMUSCULAR | Status: DC | PRN

## 2018-10-24 MED ORDER — SODIUM CHLORIDE 0.9 % IV SOLN
1.0000 g | INTRAVENOUS | Status: DC
  Administered 2018-10-25 – 2018-10-26 (×2): 1 g via INTRAVENOUS
  Filled 2018-10-24 (×2): qty 10

## 2018-10-24 MED ORDER — SODIUM CHLORIDE 0.9 % IV SOLN
INTRAVENOUS | Status: DC
  Administered 2018-10-24 – 2018-10-26 (×4): via INTRAVENOUS

## 2018-10-24 MED ORDER — ASPIRIN EC 81 MG PO TBEC
81.0000 mg | DELAYED_RELEASE_TABLET | Freq: Every day | ORAL | Status: DC
  Filled 2018-10-24 (×3): qty 1

## 2018-10-24 MED ORDER — ATORVASTATIN CALCIUM 10 MG PO TABS
10.0000 mg | ORAL_TABLET | Freq: Every day | ORAL | Status: DC
  Administered 2018-10-25 – 2018-10-27 (×3): 10 mg via ORAL
  Filled 2018-10-24 (×3): qty 1

## 2018-10-24 MED ORDER — SODIUM CHLORIDE 0.9% FLUSH: 3.0000 mL | Freq: Once | INTRAVENOUS | Status: DC

## 2018-10-24 MED ORDER — SODIUM CHLORIDE 0.9 % IV SOLN
1.0000 g | Freq: Once | INTRAVENOUS | Status: AC
  Administered 2018-10-24: 20:00:00 1 g via INTRAVENOUS
  Filled 2018-10-24: qty 10

## 2018-10-24 MED ORDER — OXYCODONE HCL 5 MG PO TABS: 10.0000 mg | ORAL_TABLET | Freq: Two times a day (BID) | ORAL | Status: DC | PRN

## 2018-10-24 MED ORDER — INSULIN ASPART 100 UNIT/ML ~~LOC~~ SOLN
0.0000 [IU] | Freq: Three times a day (TID) | SUBCUTANEOUS | Status: DC
  Administered 2018-10-25: 12:00:00 2 [IU] via SUBCUTANEOUS
  Administered 2018-10-25: 1 [IU] via SUBCUTANEOUS
  Administered 2018-10-25: 08:00:00 2 [IU] via SUBCUTANEOUS
  Administered 2018-10-26 (×2): 1 [IU] via SUBCUTANEOUS

## 2018-10-24 MED ORDER — ENOXAPARIN SODIUM 40 MG/0.4ML ~~LOC~~ SOLN
40.0000 mg | SUBCUTANEOUS | Status: DC
  Administered 2018-10-24 – 2018-10-26 (×3): 40 mg via SUBCUTANEOUS
  Filled 2018-10-24 (×3): qty 0.4

## 2018-10-24 MED ORDER — SODIUM CHLORIDE 0.9 % IV BOLUS
2000.0000 mL | Freq: Once | INTRAVENOUS | Status: AC
  Administered 2018-10-24: 2000 mL via INTRAVENOUS

## 2018-10-24 NOTE — H&P (Addendum)
TRH H&P    Patient Demographics:    Christopher Friedman, is a 68 y.o. male  MRN: [id number]  DOB - 09-23-1950  Admit Date - 10/24/2018  Referring MD/NP/PA: Dr. Roderic Palau  Outpatient Primary MD for the patient is [name], MD  Patient coming from: Home  Chief complaint-dysuria, fever   HPI:    Christopher Friedman  is a 68 y.o. male, with a history of colon cancer, status post sigmoid colon segmental resection on March 2017, with liver and lung metastasis, status post chemotherapy, completed 12 cycles of FOLFIRI on 10/29/2017, status post microwave ablation of left hepatic lobe, diabetes mellitus type 2, hypertension, GERD who came to the ED with complaints of fever, chills and dysuria for past 3 to 4 days.  Patient also complains of shortness of breath. He denies nausea vomiting or diarrhea. Denies chest pain. In the ED he was found to have abnormal UA, started on ceftriaxone. Lactic acid 2.7 No previous history of stroke or seizures. No history of CAD Denies blurred vision or dizziness.    Review of systems:    In addition to the HPI above,   All other systems reviewed and are negative.    Past History of the following :    Past Medical History:  Diagnosis Date  . Colon cancer ([place])   . Diabetes ([place])   . GERD (gastroesophageal reflux disease)   . Hypertension       Past Surgical History:  Procedure Laterality Date  . APPENDECTOMY     during colon surgery  . COLON SURGERY    . IR RADIOLOGIST EVAL & MGMT  11/26/2017  . RADIOFREQUENCY ABLATION N/A 12/10/2017   Procedure: CT MICROWAVE THERMAL ABLATION (LIVER);  Surgeon: Markus Daft, MD;  Location: [facility];  Service: Anesthesiology;  Laterality: N/A;  . SHOULDER SURGERY  2003      Social History:      Social History   Tobacco Use  . Smoking status: Former Smoker    Quit date: 04/05/1995    Years since quitting: 23.5  . Smokeless tobacco:  Never Used  Substance Use Topics  . Alcohol use: Not Currently    Alcohol/week: 0.0 standard drinks    Comment: used to drink but stopped about 20 years ago       Family History :     Family History  Problem Relation Age of Onset  . Diabetes Mother   . Cancer Father   . Bone cancer Father   . Colon cancer Neg Hx   . Colon polyps Neg Hx       Home Medications:   Prior to Admission medications   Medication Sig Start Date End Date Taking? Authorizing Provider  Alpha-Lipoic Acid 200 MG CAPS Take 1 capsule by mouth daily.   Yes [provider]  Ascorbic Acid (VITAMIN C) 1000 MG tablet Take 2,000 mg by mouth daily.    Yes [provider]  ASHWAGANDHA PO Take 800 mg by mouth daily.   Yes [provider]  aspirin 81 MG tablet  Take 81 mg by mouth daily.   Yes [provider]  atorvastatin (LIPITOR) 10 MG tablet Take 10 mg by mouth daily.  07/22/17  Yes [provider]  BEE POLLEN PO Take 1 capsule by mouth daily.    Yes [provider]  cholecalciferol (VITAMIN D) 1000 units tablet Take 5,000 Units by mouth daily.    Yes [provider]  CINNAMON PO Take 2,000 mg by mouth daily.    Yes [provider]  Cod Liver Oil CAPS Take 1 capsule by mouth daily.   Yes [provider]  Coenzyme Q10 (CO Q 10) 100 MG CAPS Take 1 capsule by mouth daily.    Yes [provider]  Cyanocobalamin (VITAMIN B-12) 5000 MCG TBDP Take 5,000 mcg by mouth daily.   Yes [provider]  cyclobenzaprine (FLEXERIL) 10 MG tablet TAKE ONE TABLET BY MOUTH EVERY EVENING AT BEDTIME AS NEEDED FOR MUSCLE SPASMS. 07/07/18  Yes [provider]  diphenoxylate-atropine (LOMOTIL) 2.5-0.025 MG tablet Take 1 tablet by mouth every 6 (six) hours as needed for diarrhea or loose stools (stomach cramps). 03/18/18  Yes Derek Jack, MD  Flax Oil-Fish Oil-Borage Oil (FISH OIL-FLAX OIL-BORAGE OIL) CAPS Take 1 capsule by mouth  daily.   Yes [provider]  fluorouracil CALGB 53614 in sodium chloride 0.9 % 150 mL Inject into the vein over 96 hr.   Yes [provider]  Garlic 4315 MG CAPS Take 2 capsules by mouth daily.    Yes [provider]  glipiZIDE (GLUCOTROL XL) 10 MG 24 hr tablet Take 10 mg by mouth daily.  07/22/17  Yes [provider]  Glucosamine-Chondroit-Collagen ([employer] GLUCO-CHONDROIT PLUS UC-II PO) Take 1 tablet by mouth daily.    Yes [provider]  IRINOTECAN HCL IV Inject into the vein every 14 (fourteen) days.    Yes [provider]  LECITHIN PO Take 1 capsule by mouth daily. 1200 mg   Yes [provider]  LEUCOVORIN CALCIUM IV Inject into the vein every 14 (fourteen) days.    Yes [provider]  magnesium oxide (MAG-OX) 400 MG tablet Take 400 mg by mouth daily.    Yes [provider]  metFORMIN (GLUCOPHAGE) 500 MG tablet Take 1 tablet (500 mg total) by mouth 2 (two) times daily with a meal. Patient taking differently: Take 1,000 mg by mouth 2 (two) times daily with a meal.  08/06/17  Yes Derek Jack, MD  milk thistle 175 MG tablet Take 175 mg by mouth daily.   Yes [provider]  OVER THE COUNTER MEDICATION Take 1 capsule by mouth daily. Mushroom Complex 750 mg   Yes [provider]  OVER THE COUNTER MEDICATION Take 1 capsule by mouth daily. Cura Med 750 mg   Yes [provider]  oxyCODONE 10 MG TABS Take 1 tablet (10 mg total) by mouth 2 (two) times daily as needed for severe pain. 07/13/18  Yes Derek Jack, MD  prochlorperazine (COMPAZINE) 10 MG tablet Take 1 tablet (10 mg total) by mouth every 6 (six) hours as needed for nausea or vomiting. 02/27/18  Yes Derek Jack, MD  valsartan-hydrochlorothiazide (DIOVAN-HCT) 160-25 MG tablet Take 1 tablet by mouth daily. 05/05/17  Yes [provider]  Blood Glucose Monitoring Suppl (ONE TOUCH ULTRA MINI) W/DEVICE KIT USE TO  CHECK BLOOD SUGAR DAILY. 01/05/15   [provider]  lidocaine-prilocaine (EMLA) cream Apply small amount over port site and cover with plastic wrap one hour  prior to appointment. 02/27/18   Derek Jack, MD     Allergies:    No Known Allergies   Physical Exam:   Vitals  Blood pressure 101/66, pulse 96, temperature (!) 102.9 F (39.4 C), temperature source Oral, resp. rate 14, weight 90.7 kg, SpO2 95 %.  1.  General: Appears in no acute distress  2. Psychiatric: Alert, oriented x3, intact insight and judgment  3. Neurologic: Cranial nerves II through grossly intact, motor strength 5/5 in all extremities  4. HEENMT:  Atraumatic normocephalic, extraocular muscle intact  5. Respiratory : Clear to auscultation bilaterally  6. Cardiovascular : S1-S2, regular, no murmur auscultated  7. Gastrointestinal:  Abdomen is soft, nontender, no organomegaly that patient     Data Review:    CBC Recent Labs  Lab 10/24/18 1956  WBC 13.5*  HGB 11.7*  HCT 35.0*  PLT 142*  MCV 90.7  MCH 30.3  MCHC 33.4  RDW 17.2*  LYMPHSABS 0.9  MONOABS 0.9  EOSABS 0.0  BASOSABS 0.0   ------------------------------------------------------------------------------------------------------------------  Results for orders placed or performed during the hospital encounter of 10/24/18 (from the past 48 hour(s))  Comprehensive metabolic panel     Status: Abnormal   Collection Time: 10/24/18  7:56 PM  Result Value Ref Range   Sodium 132 (L) 135 - 145 mmol/L   Potassium 3.9 3.5 - 5.1 mmol/L   Chloride 97 (L) 98 - 111 mmol/L   CO2 22 22 - 32 mmol/L   Glucose, Bld 192 (H) 70 - 99 mg/dL   BUN 18 8 - 23 mg/dL   Creatinine, Ser 1.27 (H) 0.61 - 1.24 mg/dL   Calcium 9.0 8.9 - 10.3 mg/dL   Total Protein 7.2 6.5 - 8.1 g/dL   Albumin 3.7 3.5 - 5.0 g/dL   AST 27 15 - 41 U/L   ALT 18 0 - 44 U/L   Alkaline Phosphatase 166 (H) 38 - 126 U/L   Total Bilirubin 0.9 0.3 - 1.2 mg/dL   GFR calc  non Af Amer 58 (L) >60 mL/min   GFR calc Af Amer >60 >60 mL/min   Anion gap 13 5 - 15    Comment: Performed at [facility], [address] [place], [place] [address]  Lactic acid, plasma     Status: Abnormal   Collection Time: 10/24/18  7:56 PM  Result Value Ref Range   Lactic Acid, Venous 2.7 (HH) 0.5 - 1.9 mmol/L    Comment: CRITICAL RESULT CALLED TO, READ BACK BY AND VERIFIED WITH: POINTDEXTER,M. AT 2028 ON 10/24/2018 BY EVA Performed at [facility], [address] [place], [place] [address]   CBC with Differential     Status: Abnormal   Collection Time: 10/24/18  7:56 PM  Result Value Ref Range   WBC 13.5 (H) 4.0 - 10.5 K/uL   RBC 3.86 (L) 4.22 - 5.81 MIL/uL   Hemoglobin 11.7 (L) 13.0 - 17.0 g/dL   HCT 35.0 (L) 39.0 - 52.0 %   MCV 90.7 80.0 - 100.0 fL   MCH 30.3 26.0 - 34.0 pg   MCHC 33.4 30.0 - 36.0 g/dL   RDW 17.2 (H) 11.5 - 15.5 %   Platelets 142 (L) 150 - 400 K/uL   nRBC 0.0 0.0 - 0.2 %   Neutrophils Relative % 86 %   Neutro Abs 11.6 (H) 1.7 - 7.7 K/uL   Lymphocytes Relative 7 %   Lymphs Abs 0.9 0.7 - 4.0 K/uL   Monocytes Relative 7 %  Monocytes Absolute 0.9 0.1 - 1.0 K/uL   Eosinophils Relative 0 %   Eosinophils Absolute 0.0 0.0 - 0.5 K/uL   Basophils Relative 0 %   Basophils Absolute 0.0 0.0 - 0.1 K/uL   Immature Granulocytes 0 %   Abs Immature Granulocytes 0.06 0.00 - 0.07 K/uL    Comment: Performed at [facility], [address], [place], [place] [address]  Protime-INR     Status: None   Collection Time: 10/24/18  7:56 PM  Result Value Ref Range   Prothrombin Time 14.7 11.4 - 15.2 seconds   INR 1.2 0.8 - 1.2    Comment: (NOTE) INR goal varies based on device and disease states. Performed at [facility], [address], [place], [place] [address]   Culture, blood (Routine x 2)     Status: None (Preliminary result)   Collection Time: 10/24/18  8:00 PM   Specimen: BLOOD RIGHT ARM  Result Value Ref Range   Specimen Description BLOOD RIGHT ARM    Special  Requests      BOTTLES DRAWN AEROBIC AND ANAEROBIC Blood Culture adequate volume Performed at [facility], [address] [place], [place] [address]    Culture PENDING    Report Status PENDING   Culture, blood (Routine x 2)     Status: None (Preliminary result)   Collection Time: 10/24/18  8:08 PM   Specimen: BLOOD LEFT HAND  Result Value Ref Range   Specimen Description BLOOD LEFT HAND    Special Requests      BOTTLES DRAWN AEROBIC AND ANAEROBIC Blood Culture results may not be optimal due to an excessive volume of blood received in culture bottles Performed at [facility], [address] [place], [place] [address]    Culture PENDING    Report Status PENDING   Urinalysis, Routine w reflex microscopic     Status: Abnormal   Collection Time: 10/24/18  8:15 PM  Result Value Ref Range   Color, Urine AMBER (A) YELLOW    Comment: BIOCHEMICALS MAY BE AFFECTED BY COLOR   APPearance HAZY (A) CLEAR   Specific Gravity, Urine 1.029 1.005 - 1.030   pH 5.0 5.0 - 8.0   Glucose, UA NEGATIVE NEGATIVE mg/dL   Hgb urine dipstick NEGATIVE NEGATIVE   Bilirubin Urine NEGATIVE NEGATIVE   Ketones, ur 5 (A) NEGATIVE mg/dL   Protein, ur 30 (A) NEGATIVE mg/dL   Nitrite NEGATIVE NEGATIVE   Leukocytes,Ua MODERATE (A) NEGATIVE   RBC / HPF 0-5 0 - 5 RBC/hpf   WBC, UA >50 (H) 0 - 5 WBC/hpf   Bacteria, UA RARE (A) NONE SEEN   Squamous Epithelial / LPF 0-5 0 - 5   Mucus PRESENT     Comment: Performed at [facility], [address], [place],  [address]  SARS Coronavirus 2 (CEPHEID- Performed in [facility] hospital lab), Hosp Order     Status: None   Collection Time: 10/24/18  8:25 PM   Specimen: Urine, Clean Catch; Nasopharyngeal  Result Value Ref Range   SARS Coronavirus 2 NEGATIVE NEGATIVE    Comment: (NOTE) If result is NEGATIVE SARS-CoV-2 target nucleic acids are NOT DETECTED. The SARS-CoV-2 RNA is generally detectable in upper and lower  respiratory specimens during the acute phase of  infection. The lowest  concentration of SARS-CoV-2 viral copies this assay can detect is 250  copies / mL. A negative result does not preclude SARS-CoV-2 infection  and should not be used as the sole basis for treatment or  other  patient management decisions.  A negative result may occur with  improper specimen collection / handling, submission of specimen other  than nasopharyngeal swab, presence of viral mutation(s) within the  areas targeted by this assay, and inadequate number of viral copies  (<250 copies / mL). A negative result must be combined with clinical  observations, patient history, and epidemiological information. If result is POSITIVE SARS-CoV-2 target nucleic acids are DETECTED. The SARS-CoV-2 RNA is generally detectable in upper and lower  respiratory specimens dur ing the acute phase of infection.  Positive  results are indicative of active infection with SARS-CoV-2.  Clinical  correlation with patient history and other diagnostic information is  necessary to determine patient infection status.  Positive results do  not rule out bacterial infection or co-infection with other viruses. If result is PRESUMPTIVE POSTIVE SARS-CoV-2 nucleic acids MAY BE PRESENT.   A presumptive positive result was obtained on the submitted specimen  and confirmed on repeat testing.  While 2019 novel coronavirus  (SARS-CoV-2) nucleic acids may be present in the submitted sample  additional confirmatory testing may be necessary for epidemiological  and / or clinical management purposes  to differentiate between  SARS-CoV-2 and other Sarbecovirus currently known to infect humans.  If clinically indicated additional testing with an alternate test  methodology [phone]) is advised. The SARS-CoV-2 RNA is generally  detectable in upper and lower respiratory sp ecimens during the acute  phase of infection. The expected result is Negative. Fact Sheet for Patients:   StrictlyIdeas.no Fact Sheet for Healthcare Providers: BankingDealers.co.za This test is not yet approved or cleared by the [place] FDA and has been authorized for detection and/or diagnosis of SARS-CoV-2 by FDA under an Emergency Use Authorization (EUA).  This EUA will remain in effect (meaning this test can be used) for the duration of the COVID-19 declaration under Section 564(b)(1) of the Act, 21 U.S.C. section 360bbb-3(b)(1), unless the authorization is terminated or revoked sooner. Performed at [facility], [address] [place], [place] [address]   Lactic acid, plasma     Status: None   Collection Time: 10/24/18  9:34 PM  Result Value Ref Range   Lactic Acid, Venous 1.2 0.5 - 1.9 mmol/L    Comment: Performed at [facility], [address], [place], [place] [address]    Chemistries  Recent Labs  Lab 10/24/18 1956  NA 132*  K 3.9  CL 97*  CO2 22  GLUCOSE 192*  BUN 18  CREATININE 1.27*  CALCIUM 9.0  AST 27  ALT 18  ALKPHOS 166*  BILITOT 0.9   ------------------------------------------------------------------------------------------------------------------  ------------------------------------------------------------------------------------------------------------------ GFR: Estimated Creatinine Clearance: 65.1 mL/min (A) (by C-G formula based on SCr of 1.27 mg/dL (H)). Liver Function Tests: Recent Labs  Lab 10/24/18 1956  AST 27  ALT 18  ALKPHOS 166*  BILITOT 0.9  PROT 7.2  ALBUMIN 3.7   No results for input(s): LIPASE, AMYLASE in the last 168 hours. No results for input(s): AMMONIA in the last 168 hours. Coagulation Profile: Recent Labs  Lab 10/24/18 1956  INR 1.2    --------------------------------------------------------------------------------------------------------------- Urine analysis:    Component Value Date/Time   COLORURINE AMBER (A) 10/24/2018 2015   APPEARANCEUR HAZY (A) 10/24/2018  2015   LABSPEC 1.029 10/24/2018 2015   PHURINE 5.0 10/24/2018 2015   GLUCOSEU NEGATIVE 10/24/2018 2015   HGBUR NEGATIVE 10/24/2018 2015   BILIRUBINUR NEGATIVE 10/24/2018 2015   KETONESUR 5 (A) 10/24/2018 2015   PROTEINUR 30 (A) 10/24/2018 2015  NITRITE NEGATIVE 10/24/2018 2015   LEUKOCYTESUR MODERATE (A) 10/24/2018 2015      Imaging Results:    Dg Chest Portable 1 View  Result Date: 10/24/2018 CLINICAL DATA:  Colon cancer chemotherapy, shortness of breath, fever, chest tightness EXAM: PORTABLE CHEST 1 VIEW COMPARISON:  CT chest, 10/21/2018 FINDINGS: The heart size and mediastinal contours are within normal limits. Right internal jugular port catheter. Numerous bilateral pulmonary masses and nodules. The visualized skeletal structures are unremarkable. IMPRESSION: Advanced pulmonary metastatic disease in keeping with findings of prior CT. There is no acute appearing airspace opacity in AP portable projection. Electronically Signed   By: Eddie Candle M.D.   On: 10/24/2018 20:20    My personal review of EKG: Rhythm NSR   Assessment & Plan:    Active Problems:   Sepsis ([place])   1. Sepsis due to UTI-patient presented with fever, lactic acid 2.7 hypotension, improved after IV fluid bolus.  Found to have abnormal UA, started on IV ceftriaxone.  Follow urine culture.  2. Dyspnea-patient complains of shortness of breath, SARS-CoV-2 2 is negative.  He is not requiring oxygen.  Chest x-ray shows advanced pulmonary metastatic disease.  No airspace opacity noted.  Will monitor closely in the hospital.  3. Diabetes mellitus type 2-we will hold oral hypoglycemic agents.  Start sliding scale insulin with NovoLog.  4. Hypertension-blood pressure was soft at the time of presentation.  Will hold Diovan HCT at this time.  5. Acute kidney injury-patient's baseline creatinine is around 1.  Today creatinine is 1.27.  Will hold Diovan HCT as above.  Started on IV normal saline.  Likely from poor p.o.  intake.  Follow BMP in a.m.  6. Metastatic colon cancer-status post chemotherapy.  Followed by oncology as outpatient   DVT Prophylaxis-   Lovenox   AM Labs Ordered, also please review Full Orders  Family Communication: Admission, patients condition and plan of care including tests being ordered have been discussed with the patient who indicate understanding and agree with the plan and Code Status.  Code Status: Full code  Admission status: Inpatient: Based on patients clinical presentation and evaluation of above clinical data, I have made determination that patient meets Inpatient criteria at this time.    Time spent in minutes : 60 min   Oswald Hillock M.D on 10/24/2018 at 10:00 PM

## 2018-10-24 NOTE — ED Triage Notes (Signed)
Pt currently on chemo for colon cancer. Started having burning with urination x 3 days ago. Pt c/o sudden sob/fever and chest tightness. Today. A/o.

## 2018-10-24 NOTE — ED Provider Notes (Signed)
[facility] EMERGENCY DEPARTMENT Provider Note   CSN: [id number] Arrival date & time: 10/24/18  1932     History   Chief Complaint Chief Complaint  Patient presents with   Shortness of Breath   Chest Pain    HPI Christopher Friedman is a 68 y.o. male.     Patient complains of fevers chills aches and dysuria.  Patient has a history of metastatic colon cancer  The history is provided by the patient. No language interpreter was used.  Fever Max temp prior to arrival:  103 Temp source:  Oral Severity:  Moderate Onset quality:  Sudden Timing:  Constant Progression:  Worsening Chronicity:  Recurrent Relieved by:  Nothing Worsened by:  Nothing Ineffective treatments:  None tried Associated symptoms: chills   Associated symptoms: no chest pain, no congestion, no cough, no diarrhea, no headaches and no rash     Past Medical History:  Diagnosis Date   Colon cancer ([place])    Diabetes ([place])    GERD (gastroesophageal reflux disease)    Hypertension     Patient Active Problem List   Diagnosis Date Noted   Goals of care, counseling/discussion 06/18/2018   Metastatic colon cancer to liver ([place]) 12/10/2017   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 Authorizing Provider  Alpha-Lipoic Acid 200 MG CAPS Take 1 capsule by mouth daily.   Yes [provider]  Ascorbic Acid (VITAMIN  C) 1000 MG tablet Take 2,000 mg by mouth daily.    Yes [provider]  ASHWAGANDHA PO Take 800 mg by mouth daily.   Yes [provider]  aspirin 81 MG tablet Take 81 mg by mouth daily.   Yes [provider]  atorvastatin (LIPITOR) 10 MG tablet Take 10 mg by mouth daily.  07/22/17  Yes [provider]  BEE POLLEN PO Take 1 capsule by mouth daily.    Yes [provider]  cholecalciferol (VITAMIN D) 1000 units tablet Take 5,000 Units by mouth daily.    Yes [provider]  CINNAMON PO Take 2,000 mg by mouth daily.    Yes [provider]  Cod Liver Oil CAPS Take 1 capsule by mouth daily.   Yes [provider]  Coenzyme Q10 (CO Q 10) 100 MG CAPS Take 1 capsule by mouth daily.    Yes [provider]  Cyanocobalamin (VITAMIN B-12) 5000 MCG TBDP Take 5,000 mcg by mouth daily.   Yes [provider]  cyclobenzaprine (FLEXERIL) 10 MG tablet TAKE ONE TABLET BY MOUTH EVERY EVENING AT BEDTIME AS NEEDED FOR MUSCLE SPASMS. 07/07/18  Yes [provider]  diphenoxylate-atropine (LOMOTIL) 2.5-0.025 MG tablet Take 1 tablet by mouth every 6 (six) hours as needed for diarrhea or loose stools (stomach cramps). 03/18/18  Yes Derek Jack, MD  Flax Oil-Fish Oil-Borage Oil (FISH OIL-FLAX OIL-BORAGE OIL) CAPS Take 1  capsule by mouth daily.   Yes [provider]  fluorouracil CALGB 33354 in sodium chloride 0.9 % 150 mL Inject into the vein over 96 hr.   Yes [provider]  Garlic 5625 MG CAPS Take 2 capsules by mouth daily.    Yes [provider]  glipiZIDE (GLUCOTROL XL) 10 MG 24 hr tablet Take 10 mg by mouth daily.  07/22/17  Yes [provider]  Glucosamine-Chondroit-Collagen ([employer] GLUCO-CHONDROIT PLUS UC-II PO) Take 1 tablet by mouth daily.    Yes [provider]  IRINOTECAN HCL IV Inject into the vein every 14 (fourteen) days.    Yes [provider]  LECITHIN PO  Take 1 capsule by mouth daily. 1200 mg   Yes [provider]  LEUCOVORIN CALCIUM IV Inject into the vein every 14 (fourteen) days.    Yes [provider]  magnesium oxide (MAG-OX) 400 MG tablet Take 400 mg by mouth daily.    Yes [provider]  metFORMIN (GLUCOPHAGE) 500 MG tablet Take 1 tablet (500 mg total) by mouth 2 (two) times daily with a meal. Patient taking differently: Take 1,000 mg by mouth 2 (two) times daily with a meal.  08/06/17  Yes Derek Jack, MD  milk thistle 175 MG tablet Take 175 mg by mouth daily.   Yes [provider]  OVER THE COUNTER MEDICATION Take 1 capsule by mouth daily. Mushroom Complex 750 mg   Yes [provider]  OVER THE COUNTER MEDICATION Take 1 capsule by mouth daily. Cura Med 750 mg   Yes [provider]  oxyCODONE 10 MG TABS Take 1 tablet (10 mg total) by mouth 2 (two) times daily as needed for severe pain. 07/13/18  Yes Derek Jack, MD  prochlorperazine (COMPAZINE) 10 MG tablet Take 1 tablet (10 mg total) by mouth every 6 (six) hours as needed for nausea or vomiting. 02/27/18  Yes Derek Jack, MD  valsartan-hydrochlorothiazide (DIOVAN-HCT) 160-25 MG tablet Take 1 tablet by mouth daily. 05/05/17  Yes [provider]  Blood Glucose Monitoring Suppl (ONE TOUCH ULTRA MINI) W/DEVICE KIT USE TO CHECK BLOOD SUGAR DAILY. 01/05/15   [provider]  lidocaine-prilocaine (EMLA) cream Apply small amount over port site and cover with plastic wrap one hour prior to appointment. 02/27/18   Derek Jack, MD    Family History Family History  Problem Relation Age of Onset   Diabetes Mother    Cancer Father    Bone cancer Father    Colon cancer Neg Hx    Colon polyps Neg Hx     Social History Social History   Tobacco Use   Smoking status: Former Smoker    Quit date: 04/05/1995    Years since quitting: 23.5   Smokeless tobacco: Never Used  Substance Use Topics     Alcohol use: Not Currently    Alcohol/week: 0.0 standard drinks    Comment: used to drink but stopped about 20 years ago   Drug use: Never     Allergies   Patient has no known allergies.   Review of Systems Review of Systems  Constitutional: Positive for chills and fever. Negative for appetite change and fatigue.  HENT: Negative for congestion, ear discharge and sinus pressure.   Eyes: Negative for discharge.  Respiratory: Negative for cough.   Cardiovascular: Negative for chest pain.  Gastrointestinal: Negative for abdominal pain and diarrhea.  Genitourinary: Negative for frequency and hematuria.  Musculoskeletal: Negative for back pain.  Skin: Negative for  rash.  Neurological: Negative for seizures and headaches.  Psychiatric/Behavioral: Negative for hallucinations.     Physical Exam Updated Vital Signs BP 99/64    Pulse (!) 102    Temp (!) 102.9 F (39.4 C) (Oral)    Resp 15    Wt 90.7 kg    SpO2 96%    BMI 27.89 kg/m   Physical Exam Vitals signs and nursing note reviewed.  Constitutional:      Appearance: He is well-developed.  HENT:     Head: Normocephalic.     Nose: Nose normal.  Eyes:     General: No scleral icterus.    Conjunctiva/sclera: Conjunctivae normal.  Neck:     Musculoskeletal: Neck supple.     Thyroid: No thyromegaly.  Cardiovascular:     Rate and Rhythm: Normal rate and regular rhythm.     Heart sounds: No murmur. No friction rub. No gallop.   Pulmonary:     Breath sounds: No stridor. No wheezing or rales.  Chest:     Chest wall: No tenderness.  Abdominal:     General: There is no distension.     Tenderness: There is no abdominal tenderness. There is no rebound.  Musculoskeletal: Normal range of motion.  Lymphadenopathy:     Cervical: No cervical adenopathy.  Skin:    Findings: No erythema or rash.  Neurological:     Mental Status: He is oriented to person, place, and time.     Motor: No abnormal muscle tone.     Coordination:  Coordination normal.  Psychiatric:        Behavior: Behavior normal.      ED Treatments / Results  Labs (all labs ordered are listed, but only abnormal results are displayed) Labs Reviewed  COMPREHENSIVE METABOLIC PANEL - Abnormal; Notable for the following components:      Result Value   Sodium 132 (*)    Chloride 97 (*)    Glucose, Bld 192 (*)    Creatinine, Ser 1.27 (*)    Alkaline Phosphatase 166 (*)    GFR calc non Af Amer 58 (*)    All other components within normal limits  LACTIC ACID, PLASMA - Abnormal; Notable for the following components:   Lactic Acid, Venous 2.7 (*)    All other components within normal limits  CBC WITH DIFFERENTIAL/PLATELET - Abnormal; Notable for the following components:   WBC 13.5 (*)    RBC 3.86 (*)    Hemoglobin 11.7 (*)    HCT 35.0 (*)    RDW 17.2 (*)    Platelets 142 (*)    Neutro Abs 11.6 (*)    All other components within normal limits  URINALYSIS, ROUTINE W REFLEX MICROSCOPIC - Abnormal; Notable for the following components:   Color, Urine AMBER (*)    APPearance HAZY (*)    Ketones, ur 5 (*)    Protein, ur 30 (*)    Leukocytes,Ua MODERATE (*)    WBC, UA >50 (*)    Bacteria, UA RARE (*)    All other components within normal limits  CULTURE, BLOOD (ROUTINE X 2)  CULTURE, BLOOD (ROUTINE X 2)  SARS CORONAVIRUS 2 (HOSPITAL ORDER, [facility])  URINE CULTURE  PROTIME-INR  LACTIC ACID, PLASMA    EKG    Radiology Dg Chest Portable 1 View  Result Date: 10/24/2018 CLINICAL DATA:  Colon cancer chemotherapy, shortness of breath, fever, chest tightness EXAM: PORTABLE CHEST 1 VIEW COMPARISON:  CT chest, 10/21/2018  FINDINGS: The heart size and mediastinal contours are within normal limits. Right internal jugular port catheter. Numerous bilateral pulmonary masses and nodules. The visualized skeletal structures are unremarkable. IMPRESSION: Advanced pulmonary metastatic disease in keeping with findings of prior  CT. There is no acute appearing airspace opacity in AP portable projection. Electronically Signed   By: Eddie Candle M.D.   On: 10/24/2018 20:20    Procedures Procedures (including critical care time)  Medications Ordered in ED Medications  sodium chloride flush (NS) 0.9 % injection 3 mL (3 mLs Intravenous Not Given 10/24/18 2035)  sodium chloride 0.9 % bolus 2,000 mL (2,000 mLs Intravenous New Bag/Given 10/24/18 2022)  cefTRIAXone (ROCEPHIN) 1 g in sodium chloride 0.9 % 100 mL IVPB (0 g Intravenous Stopped 10/24/18 2058)     Initial Impression / Assessment and Plan / ED Course  I have reviewed the triage vital signs and the nursing notes.  Pertinent labs & imaging results that were available during my care of the patient were reviewed by me and considered in my medical decision making (see chart for details).        CRITICAL CARE Performed by: Milton Ferguson Total critical care time:40 minutes Critical care time was exclusive of separately billable procedures and treating other patients. Critical care was necessary to treat or prevent imminent or life-threatening deterioration. Critical care was time spent personally by me on the following activities: development of treatment plan with patient and/or surrogate as well as nursing, discussions with consultants, evaluation of patient's response to treatment, examination of patient, obtaining history from patient or surrogate, ordering and performing treatments and interventions, ordering and review of laboratory studies, ordering and review of radiographic studies, pulse oximetry and re-evaluation of patient's condition.  Patient will be admitted for urinary tract infection possible sepsis.  F Final Clinical Impressions(s) / ED Diagnoses   Final diagnoses:  Acute cystitis without hematuria    ED Discharge Orders    None       Milton Ferguson, MD 10/24/18 2138

## 2018-10-24 NOTE — ED Notes (Signed)
Date and time results received: 10/24/18 @20 :32 (use smartphrase ".now" to insert current time)  Test: lactic acid Critical Value: 2.7  Name of Provider Notified: Dr Roderic Palau  Orders Received? Or Actions Taken?: none

## 2018-10-24 NOTE — ED Notes (Signed)
Wife, [phone] and cell [phone]

## 2018-10-25 DIAGNOSIS — A419 Sepsis, unspecified organism: Secondary | ICD-10-CM

## 2018-10-25 DIAGNOSIS — I10 Essential (primary) hypertension: Secondary | ICD-10-CM | POA: Diagnosis present

## 2018-10-25 DIAGNOSIS — E119 Type 2 diabetes mellitus without complications: Secondary | ICD-10-CM

## 2018-10-25 DIAGNOSIS — N390 Urinary tract infection, site not specified: Secondary | ICD-10-CM | POA: Diagnosis present

## 2018-10-25 LAB — HEMOGLOBIN A1C
Hgb A1c MFr Bld: 7.5 % — ABNORMAL HIGH (ref 4.8–5.6)
Mean Plasma Glucose: 168.55 mg/dL

## 2018-10-25 LAB — CBC
HCT: 33.2 % — ABNORMAL LOW (ref 39.0–52.0)
Hemoglobin: 10.6 g/dL — ABNORMAL LOW (ref 13.0–17.0)
MCH: 30 pg (ref 26.0–34.0)
MCHC: 31.9 g/dL (ref 30.0–36.0)
MCV: 94.1 fL (ref 80.0–100.0)
Platelets: 137 10*3/uL — ABNORMAL LOW (ref 150–400)
RBC: 3.53 MIL/uL — ABNORMAL LOW (ref 4.22–5.81)
RDW: 17.3 % — ABNORMAL HIGH (ref 11.5–15.5)
WBC: 12.6 10*3/uL — ABNORMAL HIGH (ref 4.0–10.5)
nRBC: 0 % (ref 0.0–0.2)

## 2018-10-25 LAB — GLUCOSE, CAPILLARY
Glucose-Capillary: 173 mg/dL — ABNORMAL HIGH (ref 70–99)
Glucose-Capillary: 176 mg/dL — ABNORMAL HIGH (ref 70–99)
Glucose-Capillary: 138 mg/dL — ABNORMAL HIGH (ref 70–99)
Glucose-Capillary: 134 mg/dL — ABNORMAL HIGH (ref 70–99)

## 2018-10-25 LAB — COMPREHENSIVE METABOLIC PANEL
ALT: 16 U/L (ref 0–44)
AST: 21 U/L (ref 15–41)
Albumin: 3.1 g/dL — ABNORMAL LOW (ref 3.5–5.0)
Alkaline Phosphatase: 139 U/L — ABNORMAL HIGH (ref 38–126)
Anion gap: 7 (ref 5–15)
BUN: 14 mg/dL (ref 8–23)
CO2: 26 mmol/L (ref 22–32)
Calcium: 8.6 mg/dL — ABNORMAL LOW (ref 8.9–10.3)
Chloride: 105 mmol/L (ref 98–111)
Creatinine, Ser: 0.91 mg/dL (ref 0.61–1.24)
GFR calc Af Amer: >60 mL/min (ref >60)
GFR calc non Af Amer: >60 mL/min (ref >60)
Glucose, Bld: 93 mg/dL (ref 70–99)
Potassium: 4 mmol/L (ref 3.5–5.1)
Sodium: 138 mmol/L (ref 135–145)
Total Bilirubin: 0.6 mg/dL (ref 0.3–1.2)
Total Protein: 6.5 g/dL (ref 6.5–8.1)

## 2018-10-25 MED ORDER — HYDRALAZINE HCL 20 MG/ML IJ SOLN: 10.0000 mg | Freq: Four times a day (QID) | INTRAMUSCULAR | Status: DC | PRN

## 2018-10-25 MED ORDER — ACETAMINOPHEN 325 MG PO TABS
650.0000 mg | ORAL_TABLET | Freq: Four times a day (QID) | ORAL | Status: DC | PRN
  Administered 2018-10-25: 650 mg via ORAL
  Filled 2018-10-25: qty 2

## 2018-10-25 NOTE — Progress Notes (Signed)
Patient Demographics:    Christopher Friedman, is a 68 y.o. male, DOB - [birth year], VQM:[id number]  Admit date - 10/24/2018   Admitting Physician Oswald Hillock, MD  Outpatient Primary MD for the patient is [name], MD  LOS - 1   Chief Complaint  Patient presents with   Shortness of Breath   Chest Pain        Subjective:    Christopher Friedman today has no fevers, no emesis,  No chest pain,     Assessment  & Plan :    Active Problems:   Sepsis [facility])  Brief Summary 68 y.o. male, with a history of colon cancer, status post sigmoid colon segmental resection on March 2017, with liver and lung metastasis, status post chemotherapy, completed 12 cycles of FOLFIRI on 10/29/2017, status post microwave ablation of left hepatic lobe, diabetes mellitus type 2, hypertension, GERD admitted on 10/24/2018 with fevers, leukocytosis elevated lactic acid dysuria and concerns about sepsis secondary to urinary source  A/p   1) sepsis secondary to presumed urinary source----T-max is 102.9 however fever curve is trending down, WBC is down to 12.6 from 13.5, continue IV Rocephin pending urine and blood culture results, may need Port-A-Cath removed if bacteremic  2)Social/Ethics--patient is a full code  3)DM2--last A1c 7.5, continue to hold metformin, Use Novolog/Humalog Sliding scale insulin with Accu-Cheks/Fingersticks as ordered  4)HTN--- stable, continue to hold valsartan HCTZ due to concerns about dehydration in the setting of sepsis    Disposition/Need for in-Hospital Stay- patient unable to be discharged at this time due to sepsis requiring IV antibiotics pending urine and blood culture results  Code Status : full  Family Communication:   NA (patient is alert, awake and coherent)  Disposition Plan  : TBD  Consults  :  na  DVT Prophylaxis  :  Lovenox - - SCDs   Lab Results  Component Value Date   PLT 137  (L) 10/25/2018    Inpatient Medications  Scheduled Meds:  aspirin EC  81 mg Oral Daily   atorvastatin  10 mg Oral Daily   enoxaparin (LOVENOX) injection  40 mg Subcutaneous Q24H   insulin aspart  0-9 Units Subcutaneous TID WC   sodium chloride flush  3 mL Intravenous Once   Continuous Infusions:  sodium chloride 100 mL/hr at 10/25/18 1500   cefTRIAXone (ROCEPHIN)  IV     PRN Meds:.ondansetron **OR** ondansetron (ZOFRAN) IV, oxyCODONE    Anti-infectives (From admission, onward)   Start     Dose/Rate Route Frequency Ordered Stop   10/25/18 2000  cefTRIAXone (ROCEPHIN) 1 g in sodium chloride 0.9 % 100 mL IVPB     1 g 200 mL/hr over 30 Minutes Intravenous Every 24 hours 10/24/18 2248     10/24/18 2000  cefTRIAXone (ROCEPHIN) 1 g in sodium chloride 0.9 % 100 mL IVPB     1 g 200 mL/hr over 30 Minutes Intravenous  Once 10/24/18 1954 10/24/18 2058        Objective:   Vitals:   10/24/18 2246 10/24/18 2248 10/25/18 0527 10/25/18 0817  BP: 121/88  109/73   Pulse: 80  66   Resp: 18  18   Temp: 99.1 F (37.3 C)  98.2 F (36.8 C)  TempSrc: Oral  Oral   SpO2: 97%  99% 97%  Weight:  90.3 kg    Height:  5\' 11"  (1.803 m)      Wt Readings from Last 3 Encounters:  10/24/18 90.3 kg  10/13/18 90.6 kg  09/28/18 90.9 kg     Intake/Output Summary (Last 24 hours) at 10/25/2018 1543 Last data filed at 10/25/2018 1500 Gross per 24 hour  Intake 4062.83 ml  Output --  Net 4062.83 ml     Physical Exam  Gen:- Awake Alert,  In no apparent distress  HEENT:- [place].AT, No sclera icterus Neck-Supple Neck,No JVD,.  Lungs-  CTAB , fair symmetrical air movement CV- S1, S2 normal, regular, right sided Port-A-Cath area nontender, no erythema and no obvious signs of infection Abd-  +ve B.Sounds, Abd Soft, No tenderness, no CVA area tenderness Extremity/Skin:- No  edema, pedal pulses present  Psych-affect is appropriate, oriented x3 Neuro-no new focal deficits, no tremors   Data  Review:   Micro Results Recent Results (from the past 240 hour(s))  Culture, blood (Routine x 2)     Status: None (Preliminary result)   Collection Time: 10/24/18  8:00 PM   Specimen: BLOOD RIGHT ARM  Result Value Ref Range Status   Specimen Description BLOOD RIGHT ARM  Final   Special Requests   Final    BOTTLES DRAWN AEROBIC AND ANAEROBIC Blood Culture adequate volume   Culture   Final    NO GROWTH < 12 HOURS Performed at [facility], [address] [place], [place] [address]    Report Status PENDING  Incomplete  Culture, blood (Routine x 2)     Status: None (Preliminary result)   Collection Time: 10/24/18  8:08 PM   Specimen: BLOOD LEFT HAND  Result Value Ref Range Status   Specimen Description BLOOD LEFT HAND  Final   Special Requests   Final    BOTTLES DRAWN AEROBIC AND ANAEROBIC Blood Culture results may not be optimal due to an excessive volume of blood received in culture bottles   Culture   Final    NO GROWTH < 12 HOURS Performed at [facility], [address], [place], [place] [address]    Report Status PENDING  Incomplete  SARS Coronavirus 2 (CEPHEID- Performed in [place] hospital lab), Hosp Order     Status: None   Collection Time: 10/24/18  8:25 PM   Specimen: Urine, Clean Catch; Nasopharyngeal  Result Value Ref Range Status   SARS Coronavirus 2 NEGATIVE NEGATIVE Final    Comment: (NOTE) If result is NEGATIVE SARS-CoV-2 target nucleic acids are NOT DETECTED. The SARS-CoV-2 RNA is generally detectable in upper and lower  respiratory specimens during the acute phase of infection. The lowest  concentration of SARS-CoV-2 viral copies this assay can detect is 250  copies / mL. A negative result does not preclude SARS-CoV-2 infection  and should not be used as the sole basis for treatment or other  patient management decisions.  A negative result may occur with  improper specimen collection / handling, submission of specimen other  than nasopharyngeal swab,  presence of viral mutation(s) within the  areas targeted by this assay, and inadequate number of viral copies  (<250 copies / mL). A negative result must be combined with clinical  observations, patient history, and epidemiological information. If result is POSITIVE SARS-CoV-2 target nucleic acids are DETECTED. The SARS-CoV-2 RNA is generally detectable in upper and lower  respiratory specimens dur ing the acute phase of infection.  Positive  results are indicative of active infection with SARS-CoV-2.  Clinical  correlation with patient history and other diagnostic information is  necessary to determine patient infection status.  Positive results do  not rule out bacterial infection or co-infection with other viruses. If result is PRESUMPTIVE POSTIVE SARS-CoV-2 nucleic acids MAY BE PRESENT.   A presumptive positive result was obtained on the submitted specimen  and confirmed on repeat testing.  While 2019 novel coronavirus  (SARS-CoV-2) nucleic acids may be present in the submitted sample  additional confirmatory testing may be necessary for epidemiological  and / or clinical management purposes  to differentiate between  SARS-CoV-2 and other Sarbecovirus currently known to infect humans.  If clinically indicated additional testing with an alternate test  methodology [phone]) is advised. The SARS-CoV-2 RNA is generally  detectable in upper and lower respiratory sp ecimens during the acute  phase of infection. The expected result is Negative. Fact Sheet for Patients:  StrictlyIdeas.no Fact Sheet for Healthcare Providers: BankingDealers.co.za This test is not yet approved or cleared by the [place] FDA and has been authorized for detection and/or diagnosis of SARS-CoV-2 by FDA under an Emergency Use Authorization (EUA).  This EUA will remain in effect (meaning this test can be used) for the duration of the COVID-19 declaration under  Section 564(b)(1) of the Act, 21 U.S.C. section 360bbb-3(b)(1), unless the authorization is terminated or revoked sooner. Performed at [facility], [address], [place], [place] [address]     Radiology Reports Ct Chest W Contrast  Result Date: 10/21/2018 CLINICAL DATA:  Restaging of metastatic colon cancer. EXAM: CT CHEST, ABDOMEN, AND PELVIS WITH CONTRAST TECHNIQUE: Multidetector CT imaging of the chest, abdomen and pelvis was performed following the standard protocol during bolus administration of intravenous contrast. CONTRAST:  129mL OMNIPAQUE IOHEXOL 300 MG/ML  SOLN COMPARISON:  Multiple exams, including 06/17/2018 FINDINGS: CT CHEST FINDINGS Cardiovascular: Right Port-A-Cath tip: Lower SVC. Coronary, aortic arch, and branch vessel atherosclerotic vascular disease. Mediastinum/Nodes: Lower right paratracheal node 1.0 cm in short axis on image 26/2, formerly the same. Anterior pericardial node 1.1 cm in short axis on image 46/2, previously the same. Lungs/Pleura: New and enlarging bilateral pulmonary nodules randomly distributed throughout both lungs. Most are solid but a small minority have internal cavitation. An index lesion in the left upper lobe measures 2.5 by 2.0 cm on image 73/4, and previously measured 1.8 by 1.7 cm the overall appearance in the lungs is compatible with significant progression. Musculoskeletal: 2.4 by 1.9 cm sclerotic lesion eccentric to the left in the T11 vertebral body, new compared to the prior exam, possibly associated with a new Schmorl's node for example on image 125/6. CT ABDOMEN PELVIS FINDINGS Hepatobiliary: Mixed response of metastatic lesions in the liver. Index lesion in the lateral segment left hepatic lobe has increased in size, measuring 7.6 by 6.3 cm, formerly 6.1 by 5.0. On the other hand, the dominant lesion in segment 4 of the liver is slightly reduced in prominence, measuring 7.5 by 5.7 cm previously 7.5 by 6.0 cm. This latter lesion partially includes  the ablation site. Other lesions likewise demonstrate variable degree of mild enlargement or reduction in size. Pancreas: Unremarkable Spleen: Unremarkable Adrenals/Urinary Tract: Diffuse wall thickening of the urinary bladder may be from nondistention. Adrenal glands unremarkable. Stomach/Bowel: Prominent stool throughout the colon favors constipation. Right groin hernia contains loop of small bowel without complicating feature. Vascular/Lymphatic: Aortoiliac atherosclerotic vascular disease. Left periaortic adenopathy adjacent to the left adrenal gland 1.7 cm in  short axis on image 64/2, formerly 1.4 cm. Celiac chain node 1.5 cm in short axis on image 61/2, formerly 1.0 cm. Reproductive: Unremarkable Other: There studding of tumor along the omentum. The thickest region of this omental studding is in the right upper quadrant and measures 1.2 cm in thickness, previously 2.1 cm in thickness. The overall degree of omental tumor caking is reduced compared to 06/16/2018. Musculoskeletal: A left direct inguinal hernia is present. The right groin hernia contains a loop of bowel and some fluid in addition to adipose tissue and extends medial to the inferior epigastric vessels, but also shows some narrowing of the right common femoral vein. Accordingly this may be either a femoral hernia, or a direct inguinal hernia. Multilevel Schmorl's nodes in the lumbar spine. Stable lucent lesion medially in the left superior pubic ramus as on image 64/5. IMPRESSION: 1. Mixed response. Generally in the lungs disease is worse with new and enlarging pulmonary nodules. However, the omental studding of tumor is improved. Mixed response in the liver with some lesions larger and some lesions smaller. Mildly worsened upper abdominal adenopathy. 2. New primarily sclerotic lesion eccentric to the left in the T11 vertebral body, favor metastatic lesion, less likely reactive findings around a Schmorl's node. 3. Other imaging findings of potential  clinical significance: Aortic Atherosclerosis (ICD10-I70.0). Coronary atherosclerosis. Prominent stool throughout the colon favors constipation. Bilateral groin hernias, containing a loop of small bowel on the right side without complicating feature. Electronically Signed   By: Van Clines M.D.   On: 10/21/2018 12:22   Ct Abdomen Pelvis W Contrast  Result Date: 10/21/2018 CLINICAL DATA:  Restaging of metastatic colon cancer. EXAM: CT CHEST, ABDOMEN, AND PELVIS WITH CONTRAST TECHNIQUE: Multidetector CT imaging of the chest, abdomen and pelvis was performed following the standard protocol during bolus administration of intravenous contrast. CONTRAST:  147mL OMNIPAQUE IOHEXOL 300 MG/ML  SOLN COMPARISON:  Multiple exams, including 06/17/2018 FINDINGS: CT CHEST FINDINGS Cardiovascular: Right Port-A-Cath tip: Lower SVC. Coronary, aortic arch, and branch vessel atherosclerotic vascular disease. Mediastinum/Nodes: Lower right paratracheal node 1.0 cm in short axis on image 26/2, formerly the same. Anterior pericardial node 1.1 cm in short axis on image 46/2, previously the same. Lungs/Pleura: New and enlarging bilateral pulmonary nodules randomly distributed throughout both lungs. Most are solid but a small minority have internal cavitation. An index lesion in the left upper lobe measures 2.5 by 2.0 cm on image 73/4, and previously measured 1.8 by 1.7 cm the overall appearance in the lungs is compatible with significant progression. Musculoskeletal: 2.4 by 1.9 cm sclerotic lesion eccentric to the left in the T11 vertebral body, new compared to the prior exam, possibly associated with a new Schmorl's node for example on image 125/6. CT ABDOMEN PELVIS FINDINGS Hepatobiliary: Mixed response of metastatic lesions in the liver. Index lesion in the lateral segment left hepatic lobe has increased in size, measuring 7.6 by 6.3 cm, formerly 6.1 by 5.0. On the other hand, the dominant lesion in segment 4 of the liver is  slightly reduced in prominence, measuring 7.5 by 5.7 cm previously 7.5 by 6.0 cm. This latter lesion partially includes the ablation site. Other lesions likewise demonstrate variable degree of mild enlargement or reduction in size. Pancreas: Unremarkable Spleen: Unremarkable Adrenals/Urinary Tract: Diffuse wall thickening of the urinary bladder may be from nondistention. Adrenal glands unremarkable. Stomach/Bowel: Prominent stool throughout the colon favors constipation. Right groin hernia contains loop of small bowel without complicating feature. Vascular/Lymphatic: Aortoiliac atherosclerotic vascular disease. Left periaortic adenopathy  adjacent to the left adrenal gland 1.7 cm in short axis on image 64/2, formerly 1.4 cm. Celiac chain node 1.5 cm in short axis on image 61/2, formerly 1.0 cm. Reproductive: Unremarkable Other: There studding of tumor along the omentum. The thickest region of this omental studding is in the right upper quadrant and measures 1.2 cm in thickness, previously 2.1 cm in thickness. The overall degree of omental tumor caking is reduced compared to 06/16/2018. Musculoskeletal: A left direct inguinal hernia is present. The right groin hernia contains a loop of bowel and some fluid in addition to adipose tissue and extends medial to the inferior epigastric vessels, but also shows some narrowing of the right common femoral vein. Accordingly this may be either a femoral hernia, or a direct inguinal hernia. Multilevel Schmorl's nodes in the lumbar spine. Stable lucent lesion medially in the left superior pubic ramus as on image 64/5. IMPRESSION: 1. Mixed response. Generally in the lungs disease is worse with new and enlarging pulmonary nodules. However, the omental studding of tumor is improved. Mixed response in the liver with some lesions larger and some lesions smaller. Mildly worsened upper abdominal adenopathy. 2. New primarily sclerotic lesion eccentric to the left in the T11 vertebral  body, favor metastatic lesion, less likely reactive findings around a Schmorl's node. 3. Other imaging findings of potential clinical significance: Aortic Atherosclerosis (ICD10-I70.0). Coronary atherosclerosis. Prominent stool throughout the colon favors constipation. Bilateral groin hernias, containing a loop of small bowel on the right side without complicating feature. Electronically Signed   By: Van Clines M.D.   On: 10/21/2018 12:22   Dg Chest Portable 1 View  Result Date: 10/24/2018 CLINICAL DATA:  Colon cancer chemotherapy, shortness of breath, fever, chest tightness EXAM: PORTABLE CHEST 1 VIEW COMPARISON:  CT chest, 10/21/2018 FINDINGS: The heart size and mediastinal contours are within normal limits. Right internal jugular port catheter. Numerous bilateral pulmonary masses and nodules. The visualized skeletal structures are unremarkable. IMPRESSION: Advanced pulmonary metastatic disease in keeping with findings of prior CT. There is no acute appearing airspace opacity in AP portable projection. Electronically Signed   By: Eddie Candle M.D.   On: 10/24/2018 20:20     CBC Recent Labs  Lab 10/24/18 1956 10/25/18 0607  WBC 13.5* 12.6*  HGB 11.7* 10.6*  HCT 35.0* 33.2*  PLT 142* 137*  MCV 90.7 94.1  MCH 30.3 30.0  MCHC 33.4 31.9  RDW 17.2* 17.3*  LYMPHSABS 0.9  --   MONOABS 0.9  --   EOSABS 0.0  --   BASOSABS 0.0  --     Chemistries  Recent Labs  Lab 10/24/18 1956 10/25/18 0607  NA 132* 138  K 3.9 4.0  CL 97* 105  CO2 22 26  GLUCOSE 192* 93  BUN 18 14  CREATININE 1.27* 0.91  CALCIUM 9.0 8.6*  AST 27 21  ALT 18 16  ALKPHOS 166* 139*  BILITOT 0.9 0.6   ------------------------------------------------------------------------------------------------------------------ No results for input(s): CHOL, HDL, LDLCALC, TRIG, CHOLHDL, LDLDIRECT in the last 72 hours.  Lab Results  Component Value Date   HGBA1C 7.5 (H) 10/25/2018    ------------------------------------------------------------------------------------------------------------------ No results for input(s): TSH, T4TOTAL, T3FREE, THYROIDAB in the last 72 hours.  Invalid input(s): FREET3 ------------------------------------------------------------------------------------------------------------------ No results for input(s): VITAMINB12, FOLATE, FERRITIN, TIBC, IRON, RETICCTPCT in the last 72 hours.  Coagulation profile Recent Labs  Lab 10/24/18 1956  INR 1.2    No results for input(s): DDIMER in the last 72 hours.  Cardiac Enzymes No results  for input(s): CKMB, TROPONINI, MYOGLOBIN in the last 168 hours.  Invalid input(s): CK ------------------------------------------------------------------------------------------------------------------ No results found for: BNP   Roxan Hockey M.D on 10/25/2018 at 3:43 PM  Go to [email or web address] - for contact info  [facility] - Office  [phone]

## 2018-10-26 DIAGNOSIS — I10 Essential (primary) hypertension: Secondary | ICD-10-CM

## 2018-10-26 DIAGNOSIS — E1169 Type 2 diabetes mellitus with other specified complication: Secondary | ICD-10-CM

## 2018-10-26 LAB — GLUCOSE, CAPILLARY
Glucose-Capillary: 92 mg/dL (ref 70–99)
Glucose-Capillary: 148 mg/dL — ABNORMAL HIGH (ref 70–99)
Glucose-Capillary: 144 mg/dL — ABNORMAL HIGH (ref 70–99)
Glucose-Capillary: 142 mg/dL — ABNORMAL HIGH (ref 70–99)

## 2018-10-26 LAB — CBC
HCT: 32.1 % — ABNORMAL LOW (ref 39.0–52.0)
Hemoglobin: 10.3 g/dL — ABNORMAL LOW (ref 13.0–17.0)
MCH: 30.2 pg (ref 26.0–34.0)
MCHC: 32.1 g/dL (ref 30.0–36.0)
MCV: 94.1 fL (ref 80.0–100.0)
Platelets: 112 10*3/uL — ABNORMAL LOW (ref 150–400)
RBC: 3.41 MIL/uL — ABNORMAL LOW (ref 4.22–5.81)
RDW: 17 % — ABNORMAL HIGH (ref 11.5–15.5)
WBC: 7.9 10*3/uL (ref 4.0–10.5)
nRBC: 0 % (ref 0.0–0.2)

## 2018-10-26 NOTE — Progress Notes (Signed)
Patient Demographics:    Christopher Friedman, is a 68 y.o. male, DOB - 06/09/1950, ZLD:[id number]  Admit date - 10/24/2018   Admitting Physician Oswald Hillock, MD  Outpatient Primary MD for the patient is [name], MD  LOS - 2   Chief Complaint  Patient presents with   Shortness of Breath   Chest Pain        Subjective:    Christopher Friedman today has  no emesis,  No chest pain,   no further dysuria, no flank pain, fever has resolved  Assessment  & Plan :    Principal Problem:   Sepsis [facility]) Active Problems:   Metastatic colon cancer to liver [facility])   Acute lower UTI   Diabetes ([place])   HTN (hypertension)  Brief Summary 68 y.o. male, with a history of colon cancer, status post sigmoid colon segmental resection on March 2017, with liver and lung metastasis, status post chemotherapy, completed 12 cycles of FOLFIRI on 10/29/2017, status post microwave ablation of left hepatic lobe, diabetes mellitus type 2, hypertension, GERD admitted on 10/24/2018 with fevers, leukocytosis elevated lactic acid dysuria and concerns about sepsis secondary to urinary source.  Patient is immunocompromised due to ongoing chemotherapy  A/p   1)Sepsis secondary to GNR UTI----fever has resolved, WBC is down to 7.9 from 13.5, continue IV Rocephin pending ID and sensitivity of urine and blood culture results in an immunocompromised patient  may need Port-A-Cath removed if bacteremic  2)Social/Ethics--patient is a full code  3)DM2--last A1c 7.5, continue to hold metformin, Use Novolog/Humalog Sliding scale insulin with Accu-Cheks/Fingersticks as ordered  4)HTN--- stable, continue to hold valsartan HCTZ due to concerns about dehydration in the setting of sepsis    Disposition/Need for in-Hospital Stay- patient unable to be discharged at this time due to sepsis requiring IV antibiotics pending ID and sensitivity urine and blood  culture results in an immunocompromised patient  Code Status : full  Family Communication:   NA (patient is alert, awake and coherent)  Disposition Plan  : Home in 1 to 2 days  Consults  :  na  DVT Prophylaxis  :  Lovenox - - SCDs   Lab Results  Component Value Date   PLT 112 (L) 10/26/2018    Inpatient Medications  Scheduled Meds:  aspirin EC  81 mg Oral Daily   atorvastatin  10 mg Oral Daily   enoxaparin (LOVENOX) injection  40 mg Subcutaneous Q24H   insulin aspart  0-9 Units Subcutaneous TID WC   sodium chloride flush  3 mL Intravenous Once   Continuous Infusions:  sodium chloride 100 mL/hr at 10/26/18 0754   cefTRIAXone (ROCEPHIN)  IV 1 g (10/25/18 1956)   PRN Meds:.acetaminophen, hydrALAZINE, ondansetron **OR** ondansetron (ZOFRAN) IV, oxyCODONE    Anti-infectives (From admission, onward)   Start     Dose/Rate Route Frequency Ordered Stop   10/25/18 2000  cefTRIAXone (ROCEPHIN) 1 g in sodium chloride 0.9 % 100 mL IVPB     1 g 200 mL/hr over 30 Minutes Intravenous Every 24 hours 10/24/18 2248     10/24/18 2000  cefTRIAXone (ROCEPHIN) 1 g in sodium chloride 0.9 % 100 mL IVPB     1 g 200 mL/hr over 30 Minutes Intravenous  Once 10/24/18 1954 10/24/18 2058        Objective:   Vitals:   10/25/18 1937 10/25/18 2125 10/25/18 2352 10/26/18 0455  BP:  (!) 110/59  119/77  Pulse: 67 70  64  Resp: 16 18  16   Temp:  99.7 F (37.6 C) 99.4 F (37.4 C) 98.3 F (36.8 C)  TempSrc:  Oral Oral Oral  SpO2: 97% 100%  97%  Weight:      Height:        Wt Readings from Last 3 Encounters:  10/24/18 90.3 kg  10/13/18 90.6 kg  09/28/18 90.9 kg     Intake/Output Summary (Last 24 hours) at 10/26/2018 0936 Last data filed at 10/26/2018 0300 Gross per 24 hour  Intake 2491.4 ml  Output --  Net 2491.4 ml     Physical Exam  Gen:- Awake Alert,  In no apparent distress  HEENT:- [place].AT, No sclera icterus Neck-Supple Neck,No JVD,.  Lungs-  CTAB , fair symmetrical  air movement CV- S1, S2 normal, regular, right sided Port-A-Cath area nontender, no erythema and no obvious signs of infection Abd-  +ve B.Sounds, Abd Soft, No tenderness, no CVA area tenderness Extremity/Skin:- No  edema, pedal pulses present  Psych-affect is appropriate, oriented x3 Neuro-no new focal deficits, no tremors   Data Review:   Micro Results Recent Results (from the past 240 hour(s))  Culture, blood (Routine x 2)     Status: None (Preliminary result)   Collection Time: 10/24/18  8:00 PM   Specimen: BLOOD RIGHT ARM  Result Value Ref Range Status   Specimen Description BLOOD RIGHT ARM  Final   Special Requests   Final    BOTTLES DRAWN AEROBIC AND ANAEROBIC Blood Culture adequate volume   Culture   Final    NO GROWTH 2 DAYS Performed at [facility], [address], [place], [place] [address]    Report Status PENDING  Incomplete  Culture, blood (Routine x 2)     Status: None (Preliminary result)   Collection Time: 10/24/18  8:08 PM   Specimen: BLOOD LEFT HAND  Result Value Ref Range Status   Specimen Description BLOOD LEFT HAND  Final   Special Requests   Final    BOTTLES DRAWN AEROBIC AND ANAEROBIC Blood Culture results may not be optimal due to an excessive volume of blood received in culture bottles   Culture   Final    NO GROWTH 2 DAYS Performed at [facility], [address], [place], [place] [address]    Report Status PENDING  Incomplete  Urine Culture     Status: Abnormal (Preliminary result)   Collection Time: 10/24/18  8:15 PM   Specimen: Urine, Clean Catch  Result Value Ref Range Status   Specimen Description   Final    URINE, CLEAN CATCH Performed at [facility], [address] [place], [place] [address]    Special Requests   Final    NONE Performed at [facility], [address] [place], [place] [address]    Culture >=100,000 COLONIES/mL GRAM NEGATIVE RODS (A)  Final   Report Status PENDING  Incomplete  SARS Coronavirus 2 (CEPHEID- Performed in  [place] hospital lab), Hosp Order     Status: None   Collection Time: 10/24/18  8:25 PM   Specimen: Urine, Clean Catch; Nasopharyngeal  Result Value Ref Range Status   SARS Coronavirus 2 NEGATIVE NEGATIVE Final    Comment: (NOTE) If result is NEGATIVE SARS-CoV-2 target nucleic acids are NOT DETECTED.  The SARS-CoV-2 RNA is generally detectable in upper and lower  respiratory specimens during the acute phase of infection. The lowest  concentration of SARS-CoV-2 viral copies this assay can detect is 250  copies / mL. A negative result does not preclude SARS-CoV-2 infection  and should not be used as the sole basis for treatment or other  patient management decisions.  A negative result may occur with  improper specimen collection / handling, submission of specimen other  than nasopharyngeal swab, presence of viral mutation(s) within the  areas targeted by this assay, and inadequate number of viral copies  (<250 copies / mL). A negative result must be combined with clinical  observations, patient history, and epidemiological information. If result is POSITIVE SARS-CoV-2 target nucleic acids are DETECTED. The SARS-CoV-2 RNA is generally detectable in upper and lower  respiratory specimens dur ing the acute phase of infection.  Positive  results are indicative of active infection with SARS-CoV-2.  Clinical  correlation with patient history and other diagnostic information is  necessary to determine patient infection status.  Positive results do  not rule out bacterial infection or co-infection with other viruses. If result is PRESUMPTIVE POSTIVE SARS-CoV-2 nucleic acids MAY BE PRESENT.   A presumptive positive result was obtained on the submitted specimen  and confirmed on repeat testing.  While 2019 novel coronavirus  (SARS-CoV-2) nucleic acids may be present in the submitted sample  additional confirmatory testing may be necessary for epidemiological  and / or clinical management  purposes  to differentiate between  SARS-CoV-2 and other Sarbecovirus currently known to infect humans.  If clinically indicated additional testing with an alternate test  methodology [phone]) is advised. The SARS-CoV-2 RNA is generally  detectable in upper and lower respiratory sp ecimens during the acute  phase of infection. The expected result is Negative. Fact Sheet for Patients:  StrictlyIdeas.no Fact Sheet for Healthcare Providers: BankingDealers.co.za This test is not yet approved or cleared by the [place] FDA and has been authorized for detection and/or diagnosis of SARS-CoV-2 by FDA under an Emergency Use Authorization (EUA).  This EUA will remain in effect (meaning this test can be used) for the duration of the COVID-19 declaration under Section 564(b)(1) of the Act, 21 U.S.C. section 360bbb-3(b)(1), unless the authorization is terminated or revoked sooner. Performed at [facility], [address] [place], [place] [address]     Radiology Reports Ct Chest W Contrast  Result Date: 10/21/2018 CLINICAL DATA:  Restaging of metastatic colon cancer. EXAM: CT CHEST, ABDOMEN, AND PELVIS WITH CONTRAST TECHNIQUE: Multidetector CT imaging of the chest, abdomen and pelvis was performed following the standard protocol during bolus administration of intravenous contrast. CONTRAST:  174mL OMNIPAQUE IOHEXOL 300 MG/ML  SOLN COMPARISON:  Multiple exams, including 06/17/2018 FINDINGS: CT CHEST FINDINGS Cardiovascular: Right Port-A-Cath tip: Lower SVC. Coronary, aortic arch, and branch vessel atherosclerotic vascular disease. Mediastinum/Nodes: Lower right paratracheal node 1.0 cm in short axis on image 26/2, formerly the same. Anterior pericardial node 1.1 cm in short axis on image 46/2, previously the same. Lungs/Pleura: New and enlarging bilateral pulmonary nodules randomly distributed throughout both lungs. Most are solid but a small minority  have internal cavitation. An index lesion in the left upper lobe measures 2.5 by 2.0 cm on image 73/4, and previously measured 1.8 by 1.7 cm the overall appearance in the lungs is compatible with significant progression. Musculoskeletal: 2.4 by 1.9 cm sclerotic lesion eccentric to the left in the T11 vertebral body, new compared to the prior  exam, possibly associated with a new Schmorl's node for example on image 125/6. CT ABDOMEN PELVIS FINDINGS Hepatobiliary: Mixed response of metastatic lesions in the liver. Index lesion in the lateral segment left hepatic lobe has increased in size, measuring 7.6 by 6.3 cm, formerly 6.1 by 5.0. On the other hand, the dominant lesion in segment 4 of the liver is slightly reduced in prominence, measuring 7.5 by 5.7 cm previously 7.5 by 6.0 cm. This latter lesion partially includes the ablation site. Other lesions likewise demonstrate variable degree of mild enlargement or reduction in size. Pancreas: Unremarkable Spleen: Unremarkable Adrenals/Urinary Tract: Diffuse wall thickening of the urinary bladder may be from nondistention. Adrenal glands unremarkable. Stomach/Bowel: Prominent stool throughout the colon favors constipation. Right groin hernia contains loop of small bowel without complicating feature. Vascular/Lymphatic: Aortoiliac atherosclerotic vascular disease. Left periaortic adenopathy adjacent to the left adrenal gland 1.7 cm in short axis on image 64/2, formerly 1.4 cm. Celiac chain node 1.5 cm in short axis on image 61/2, formerly 1.0 cm. Reproductive: Unremarkable Other: There studding of tumor along the omentum. The thickest region of this omental studding is in the right upper quadrant and measures 1.2 cm in thickness, previously 2.1 cm in thickness. The overall degree of omental tumor caking is reduced compared to 06/16/2018. Musculoskeletal: A left direct inguinal hernia is present. The right groin hernia contains a loop of bowel and some fluid in addition to  adipose tissue and extends medial to the inferior epigastric vessels, but also shows some narrowing of the right common femoral vein. Accordingly this may be either a femoral hernia, or a direct inguinal hernia. Multilevel Schmorl's nodes in the lumbar spine. Stable lucent lesion medially in the left superior pubic ramus as on image 64/5. IMPRESSION: 1. Mixed response. Generally in the lungs disease is worse with new and enlarging pulmonary nodules. However, the omental studding of tumor is improved. Mixed response in the liver with some lesions larger and some lesions smaller. Mildly worsened upper abdominal adenopathy. 2. New primarily sclerotic lesion eccentric to the left in the T11 vertebral body, favor metastatic lesion, less likely reactive findings around a Schmorl's node. 3. Other imaging findings of potential clinical significance: Aortic Atherosclerosis (ICD10-I70.0). Coronary atherosclerosis. Prominent stool throughout the colon favors constipation. Bilateral groin hernias, containing a loop of small bowel on the right side without complicating feature. Electronically Signed   By: Van Clines M.D.   On: 10/21/2018 12:22   Ct Abdomen Pelvis W Contrast  Result Date: 10/21/2018 CLINICAL DATA:  Restaging of metastatic colon cancer. EXAM: CT CHEST, ABDOMEN, AND PELVIS WITH CONTRAST TECHNIQUE: Multidetector CT imaging of the chest, abdomen and pelvis was performed following the standard protocol during bolus administration of intravenous contrast. CONTRAST:  125mL OMNIPAQUE IOHEXOL 300 MG/ML  SOLN COMPARISON:  Multiple exams, including 06/17/2018 FINDINGS: CT CHEST FINDINGS Cardiovascular: Right Port-A-Cath tip: Lower SVC. Coronary, aortic arch, and branch vessel atherosclerotic vascular disease. Mediastinum/Nodes: Lower right paratracheal node 1.0 cm in short axis on image 26/2, formerly the same. Anterior pericardial node 1.1 cm in short axis on image 46/2, previously the same. Lungs/Pleura: New  and enlarging bilateral pulmonary nodules randomly distributed throughout both lungs. Most are solid but a small minority have internal cavitation. An index lesion in the left upper lobe measures 2.5 by 2.0 cm on image 73/4, and previously measured 1.8 by 1.7 cm the overall appearance in the lungs is compatible with significant progression. Musculoskeletal: 2.4 by 1.9 cm sclerotic lesion eccentric to the left in  the T11 vertebral body, new compared to the prior exam, possibly associated with a new Schmorl's node for example on image 125/6. CT ABDOMEN PELVIS FINDINGS Hepatobiliary: Mixed response of metastatic lesions in the liver. Index lesion in the lateral segment left hepatic lobe has increased in size, measuring 7.6 by 6.3 cm, formerly 6.1 by 5.0. On the other hand, the dominant lesion in segment 4 of the liver is slightly reduced in prominence, measuring 7.5 by 5.7 cm previously 7.5 by 6.0 cm. This latter lesion partially includes the ablation site. Other lesions likewise demonstrate variable degree of mild enlargement or reduction in size. Pancreas: Unremarkable Spleen: Unremarkable Adrenals/Urinary Tract: Diffuse wall thickening of the urinary bladder may be from nondistention. Adrenal glands unremarkable. Stomach/Bowel: Prominent stool throughout the colon favors constipation. Right groin hernia contains loop of small bowel without complicating feature. Vascular/Lymphatic: Aortoiliac atherosclerotic vascular disease. Left periaortic adenopathy adjacent to the left adrenal gland 1.7 cm in short axis on image 64/2, formerly 1.4 cm. Celiac chain node 1.5 cm in short axis on image 61/2, formerly 1.0 cm. Reproductive: Unremarkable Other: There studding of tumor along the omentum. The thickest region of this omental studding is in the right upper quadrant and measures 1.2 cm in thickness, previously 2.1 cm in thickness. The overall degree of omental tumor caking is reduced compared to 06/16/2018. Musculoskeletal:  A left direct inguinal hernia is present. The right groin hernia contains a loop of bowel and some fluid in addition to adipose tissue and extends medial to the inferior epigastric vessels, but also shows some narrowing of the right common femoral vein. Accordingly this may be either a femoral hernia, or a direct inguinal hernia. Multilevel Schmorl's nodes in the lumbar spine. Stable lucent lesion medially in the left superior pubic ramus as on image 64/5. IMPRESSION: 1. Mixed response. Generally in the lungs disease is worse with new and enlarging pulmonary nodules. However, the omental studding of tumor is improved. Mixed response in the liver with some lesions larger and some lesions smaller. Mildly worsened upper abdominal adenopathy. 2. New primarily sclerotic lesion eccentric to the left in the T11 vertebral body, favor metastatic lesion, less likely reactive findings around a Schmorl's node. 3. Other imaging findings of potential clinical significance: Aortic Atherosclerosis (ICD10-I70.0). Coronary atherosclerosis. Prominent stool throughout the colon favors constipation. Bilateral groin hernias, containing a loop of small bowel on the right side without complicating feature. Electronically Signed   By: Van Clines M.D.   On: 10/21/2018 12:22   Dg Chest Portable 1 View  Result Date: 10/24/2018 CLINICAL DATA:  Colon cancer chemotherapy, shortness of breath, fever, chest tightness EXAM: PORTABLE CHEST 1 VIEW COMPARISON:  CT chest, 10/21/2018 FINDINGS: The heart size and mediastinal contours are within normal limits. Right internal jugular port catheter. Numerous bilateral pulmonary masses and nodules. The visualized skeletal structures are unremarkable. IMPRESSION: Advanced pulmonary metastatic disease in keeping with findings of prior CT. There is no acute appearing airspace opacity in AP portable projection. Electronically Signed   By: Eddie Candle M.D.   On: 10/24/2018 20:20     CBC Recent  Labs  Lab 10/24/18 1956 10/25/18 0607 10/26/18 0450  WBC 13.5* 12.6* 7.9  HGB 11.7* 10.6* 10.3*  HCT 35.0* 33.2* 32.1*  PLT 142* 137* 112*  MCV 90.7 94.1 94.1  MCH 30.3 30.0 30.2  MCHC 33.4 31.9 32.1  RDW 17.2* 17.3* 17.0*  LYMPHSABS 0.9  --   --   MONOABS 0.9  --   --  EOSABS 0.0  --   --   BASOSABS 0.0  --   --     Chemistries  Recent Labs  Lab 10/24/18 1956 10/25/18 0607  NA 132* 138  K 3.9 4.0  CL 97* 105  CO2 22 26  GLUCOSE 192* 93  BUN 18 14  CREATININE 1.27* 0.91  CALCIUM 9.0 8.6*  AST 27 21  ALT 18 16  ALKPHOS 166* 139*  BILITOT 0.9 0.6   ------------------------------------------------------------------------------------------------------------------ No results for input(s): CHOL, HDL, LDLCALC, TRIG, CHOLHDL, LDLDIRECT in the last 72 hours.  Lab Results  Component Value Date   HGBA1C 7.5 (H) 10/25/2018   ------------------------------------------------------------------------------------------------------------------ No results for input(s): TSH, T4TOTAL, T3FREE, THYROIDAB in the last 72 hours.  Invalid input(s): FREET3 ------------------------------------------------------------------------------------------------------------------ No results for input(s): VITAMINB12, FOLATE, FERRITIN, TIBC, IRON, RETICCTPCT in the last 72 hours.  Coagulation profile Recent Labs  Lab 10/24/18 1956  INR 1.2    No results for input(s): DDIMER in the last 72 hours.  Cardiac Enzymes No results for input(s): CKMB, TROPONINI, MYOGLOBIN in the last 168 hours.  Invalid input(s): CK ------------------------------------------------------------------------------------------------------------------ No results found for: BNP   Roxan Hockey M.D on 10/26/2018 at 9:36 AM  Go to [email or web address] - for contact info  [facility] - Office  [phone]

## 2018-10-26 NOTE — Care Management Important Message (Signed)
Important Message  Patient Details  Name: Christopher Friedman MRN: [id number] Date of Birth: Aug 08, 1950   Medicare Important Message Given:  Yes     Tommy Medal 10/26/2018, 3:22 PM

## 2018-10-26 NOTE — TOC Initial Note (Signed)
Transition of Care [facility]) - Initial/Assessment Note    Patient Details  Name: Christopher Friedman MRN: [id number] Date of Birth: 12/07/1950  [facility] CM/SW Contact:    Christopher Mage, LCSW Phone Number: 10/26/2018, 12:57 PM  Clinical Narrative:   Resident of [place], here for sepsis treatment, also cancer patient.  Lives at home with his wife of 84 years.  Daughter and half the grandchildren live next door, son and other half live 45 minutes away.  Good supports. Has been retired since 2017.  Says he works part time when able.  PCP is Christopher Friedman, and he is asking for a hospital follow up appointment which I will schedule..  No transportation needs. No medication needs. No DME nor home health needs.              Expected Discharge Plan: Home/Self Care Barriers to Discharge: No Barriers Identified   Patient Goals and CMS Choice Patient states their goals for this hospitalization and ongoing recovery are:: "Get back home to see my wife and my grandkids. Feel well enough to be able to work part time again."      Expected Discharge Plan and Services Expected Discharge Plan: Home/Self Care       Living arrangements for the past 2 months: Single Family Home Expected Discharge Date: 10/25/18                                    Prior Living Arrangements/Services Living arrangements for the past 2 months: Single Family Home Lives with:: Spouse Patient language and need for interpreter reviewed:: Yes Do you feel safe going back to the place where you live?: Yes      Need for Family Participation in Patient Care: Yes (Comment) Care giver support system in place?: Yes (comment)   Criminal Activity/Legal Involvement Pertinent to Current Situation/Hospitalization: No - Comment as needed  Activities of Daily Living Home Assistive Devices/Equipment: CBG Meter ADL Screening (condition at time of admission) Patient's cognitive ability adequate to safely complete daily  activities?: Yes Is the patient deaf or have difficulty hearing?: No Does the patient have difficulty seeing, even when wearing glasses/contacts?: No Does the patient have difficulty concentrating, remembering, or making decisions?: No Patient able to express need for assistance with ADLs?: No Does the patient have difficulty dressing or bathing?: No Independently performs ADLs?: Yes (appropriate for developmental age) Does the patient have difficulty walking or climbing stairs?: No Weakness of Legs: None Weakness of Arms/Hands: None  Permission Sought/Granted                  Emotional Assessment Appearance:: Appears stated age Attitude/Demeanor/Rapport: Engaged Affect (typically observed): Appropriate Orientation: : Oriented to Self, Oriented to Place, Oriented to  Time, Oriented to Situation Alcohol / Substance Use: Not Applicable Psych Involvement: No (comment)  Admission diagnosis:  Acute cystitis without hematuria [N30.00] Patient Active Problem List   Diagnosis Date Noted  . Acute lower UTI 10/25/2018  . Diabetes ([place]) 10/25/2018  . HTN (hypertension) 10/25/2018  . Sepsis ([place]) 10/24/2018  . Goals of care, counseling/discussion 06/18/2018  . Metastatic colon cancer to liver ([place]) 12/10/2017  . 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 GERD (gastroesophageal reflux disease) 04/05/2015   PCP:  Christopher Bis, MD Pharmacy:   [facility] [place] [phone]  [place] [address] Phone: [phone] Fax: [phone]     Social Determinants of Health (SDOH) Interventions    Readmission Risk Interventions No flowsheet data found.

## 2018-10-27 ENCOUNTER — Ambulatory Visit (HOSPITAL_COMMUNITY): Payer: Medicare PPO

## 2018-10-27 ENCOUNTER — Ambulatory Visit (HOSPITAL_COMMUNITY): Payer: Medicare PPO | Admitting: Hematology

## 2018-10-27 ENCOUNTER — Other Ambulatory Visit (HOSPITAL_COMMUNITY): Payer: Medicare PPO

## 2018-10-27 DIAGNOSIS — A4151 Sepsis due to Escherichia coli [E. coli]: Principal | ICD-10-CM

## 2018-10-27 LAB — URINE CULTURE: Culture: >=100000 — AB

## 2018-10-27 LAB — GLUCOSE, CAPILLARY: Glucose-Capillary: 116 mg/dL — ABNORMAL HIGH (ref 70–99)

## 2018-10-27 MED ORDER — VALSARTAN-HYDROCHLOROTHIAZIDE 160-12.5 MG PO TABS: 1.0000 | ORAL_TABLET | Freq: Every day | ORAL | 11 refills | Status: AC

## 2018-10-27 MED ORDER — CEPHALEXIN 500 MG PO CAPS: 500.0000 mg | ORAL_CAPSULE | Freq: Three times a day (TID) | ORAL | 0 refills | Status: AC

## 2018-10-27 MED ORDER — ACETAMINOPHEN 325 MG PO TABS: 650.0000 mg | ORAL_TABLET | Freq: Four times a day (QID) | ORAL | 1 refills | Status: AC | PRN

## 2018-10-27 MED ORDER — ONDANSETRON HCL 4 MG PO TABS: 4.0000 mg | ORAL_TABLET | Freq: Four times a day (QID) | ORAL | 0 refills | Status: AC | PRN

## 2018-10-27 MED ORDER — ASPIRIN 81 MG PO TABS: 81.0000 mg | ORAL_TABLET | Freq: Every day | ORAL | 5 refills | Status: AC

## 2018-10-27 NOTE — Discharge Summary (Signed)
Christopher Friedman, is a 68 y.o. male  DOB Aug 18, 1950  MRN [id number].  Admission date:  10/24/2018  Admitting Physician  Oswald Hillock, MD  Discharge Date:  10/27/2018   Primary MD  [name], MD  Recommendations for primary care physician for things to follow:   1) you have E. coli UTI infection--- please complete Keflex/cephalexin antibiotic as prescribed 2) please follow-up with Dr. Delton Coombes next week to discuss further treatment for your cancer   Admission Diagnosis  Acute cystitis without hematuria [N30.00]   Discharge Diagnosis  Acute cystitis without hematuria [N30.00]    Principal Problem:   Sepsis ([place]) Active Problems:   Metastatic colon cancer to liver ([place])   Acute lower UTI   Diabetes ([place])   HTN (hypertension)      Past Medical History:  Diagnosis Date   Colon cancer ([place])    Diabetes ([place])    GERD (gastroesophageal reflux disease)    Hypertension     Past Surgical History:  Procedure Laterality Date   APPENDECTOMY     during colon surgery   COLON SURGERY     IR RADIOLOGIST EVAL & MGMT  11/26/2017   RADIOFREQUENCY ABLATION N/A 12/10/2017   Procedure: CT MICROWAVE THERMAL ABLATION (LIVER);  Surgeon: Markus Daft, MD;  Location: [facility];  Service: Anesthesiology;  Laterality: N/A;   SHOULDER SURGERY  2003     HPI  from the history and physical done on the day of admission:    Christopher Friedman  is a 68 y.o. male, with a history of colon cancer, status post sigmoid colon segmental resection on March 2017, with liver and lung metastasis, status post chemotherapy, completed 12 cycles of FOLFIRI on 10/29/2017, status post microwave ablation of left hepatic lobe, diabetes mellitus type 2, hypertension, GERD who came to the ED with complaints of fever, chills and dysuria for past 3 to 4 days.  Patient also complains of shortness of breath. He denies nausea vomiting or  diarrhea. Denies chest pain. In the ED he was found to have abnormal UA, started on ceftriaxone. Lactic acid 2.7 No previous history of stroke or seizures. No history of CAD Denies blurred vision or dizziness.     Hospital Course:    Brief Summary 68 y.o.male,with a history of colon cancer, status post sigmoid colon segmental resection on March 2017, with liver and lung metastasis, status post chemotherapy, completed 12 cycles of FOLFIRI on 10/29/2017, status post microwave ablation of left hepatic lobe, diabetes mellitus type 2, hypertension, GERD admitted on 10/24/2018 with fevers, leukocytosis elevated lactic acid dysuria and concerns about sepsis secondary to urinary source.  Patient is immunocompromised due to ongoing chemotherapy  A/p   1)Sepsis secondary to E.coli UTI----fever has resolved,  leukocytosis has normalized, patient was treated with IV Rocephin, okay to discharge on Keflex 500 3 times daily for additional  5 days as per urine culture ID and sensitivity--- patient is immunocompromised due to ongoing monotherapy immunocompromised.  No bacteremia  2)Social/Ethics--patient is a full code  3)DM2--last  A1c 7.5, okay to restart metformin  4)HTN--- stable, change valsartan HCTZ to 160/12.5 daily   5) history of colon cancer--- discussed with Dr. Delton Coombes, patient will follow-up on 10/28/2018 with Dr. Delton Coombes to discuss results of recent imaging studies  Disposition--- HomeCode Status : full  Family Communication:   NA (patient is alert, awake and coherent)  Disposition Plan  : Home   Consults  :   Phone consult with Dr. Delton Coombes  Discharge Condition: stable  Follow UP  [facility] up.   Specialty: Oncology Contact information: [address] [id number] Christopher Friedman [place]       [name], MD Follow up on 11/04/2018.   Specialty: Family Medicine Why: Wednesday at  1:00 with the Dr for your hospital follow up appointment Contact information: [place] [id number] [phone]           Diet and Activity recommendation:  As advised  Discharge Instructions    Discharge Instructions    Call MD for:  difficulty breathing, headache or visual disturbances   Complete by: As directed    Call MD for:  persistant dizziness or light-headedness   Complete by: As directed    Call MD for:  persistant nausea and vomiting   Complete by: As directed    Call MD for:  severe uncontrolled pain   Complete by: As directed    Call MD for:  temperature >100.4   Complete by: As directed    Diet - low sodium heart healthy   Complete by: As directed    Diet Carb Modified   Complete by: As directed    Discharge instructions   Complete by: As directed    1) you have E. coli UTI infection--- please complete Keflex/cephalexin antibiotic as prescribed 2) please follow-up with Dr. Delton Coombes next week to discuss further treatment for your cancer   Increase activity slowly   Complete by: As directed         Discharge Medications     Allergies as of 10/27/2018   No Known Allergies     Medication List    STOP taking these medications   valsartan-hydrochlorothiazide 160-25 MG tablet Commonly known as: DIOVAN-HCT Replaced by: valsartan-hydrochlorothiazide 160-12.5 MG tablet     TAKE these medications   acetaminophen 325 MG tablet Commonly known as: TYLENOL Take 2 tablets (650 mg total) by mouth every 6 (six) hours as needed for mild pain, fever or headache.   Alpha-Lipoic Acid 200 MG Caps Take 1 capsule by mouth daily.   ASHWAGANDHA PO Take 800 mg by mouth daily.   aspirin 81 MG tablet Take 1 tablet (81 mg total) by mouth daily with breakfast. What changed: when to take this   atorvastatin 10 MG tablet Commonly known as: LIPITOR Take 10 mg by mouth daily.   BEE POLLEN PO Take 1 capsule by mouth daily.   cephALEXin 500 MG  capsule Commonly known as: Keflex Take 1 capsule (500 mg total) by mouth 3 (three) times daily for 5 days.   cholecalciferol 1000 units tablet Commonly known as: VITAMIN D Take 5,000 Units by mouth daily.   CINNAMON PO Take 2,000 mg by mouth daily.   Co Q 10 100 MG Caps Take 1 capsule by mouth daily.   Cod Liver Oil Caps Take 1 capsule by mouth daily.   [employer] GLUCO-CHONDROIT PLUS UC-II PO Take 1 tablet by mouth daily.  cyclobenzaprine 10 MG tablet Commonly known as: FLEXERIL TAKE ONE TABLET BY MOUTH EVERY EVENING AT BEDTIME AS NEEDED FOR MUSCLE SPASMS.   diphenoxylate-atropine 2.5-0.025 MG tablet Commonly known as: LOMOTIL Take 1 tablet by mouth every 6 (six) hours as needed for diarrhea or loose stools (stomach cramps).   Fish Oil-Flax Oil-Borage Oil Caps Take 1 capsule by mouth daily.   fluorouracil CALGB 16109 in sodium chloride 0.9 % 150 mL Inject into the vein over 96 hr.   Garlic 6045 MG Caps Take 2 capsules by mouth daily.   glipiZIDE 10 MG 24 hr tablet Commonly known as: GLUCOTROL XL Take 10 mg by mouth daily.   IRINOTECAN HCL IV Inject into the vein every 14 (fourteen) days.   LECITHIN PO Take 1 capsule by mouth daily. 1200 mg   LEUCOVORIN CALCIUM IV Inject into the vein every 14 (fourteen) days.   lidocaine-prilocaine cream Commonly known as: EMLA Apply small amount over port site and cover with plastic wrap one hour prior to appointment.   magnesium oxide 400 MG tablet Commonly known as: MAG-OX Take 400 mg by mouth daily.   metFORMIN 500 MG tablet Commonly known as: GLUCOPHAGE Take 1 tablet (500 mg total) by mouth 2 (two) times daily with a meal. What changed: how much to take   milk thistle 175 MG tablet Take 175 mg by mouth daily.   ondansetron 4 MG tablet Commonly known as: ZOFRAN Take 1 tablet (4 mg total) by mouth every 6 (six) hours as needed for nausea.   ONE TOUCH ULTRA MINI w/Device Kit USE TO CHECK BLOOD SUGAR DAILY.   OVER  THE COUNTER MEDICATION Take 1 capsule by mouth daily. Mushroom Complex 750 mg   OVER THE COUNTER MEDICATION Take 1 capsule by mouth daily. Cura Med 750 mg   Oxycodone HCl 10 MG Tabs Take 1 tablet (10 mg total) by mouth 2 (two) times daily as needed for severe pain.   prochlorperazine 10 MG tablet Commonly known as: COMPAZINE Take 1 tablet (10 mg total) by mouth every 6 (six) hours as needed for nausea or vomiting.   valsartan-hydrochlorothiazide 160-12.5 MG tablet Commonly known as: Diovan HCT Take 1 tablet by mouth daily. Replaces: valsartan-hydrochlorothiazide 160-25 MG tablet   Vitamin B-12 5000 MCG Tbdp Take 5,000 mcg by mouth daily.   vitamin C 1000 MG tablet Take 2,000 mg by mouth daily.       Major procedures and Radiology Reports - PLEASE review detailed and final reports for all details, in brief -   Ct Chest W Contrast  Result Date: 10/21/2018 CLINICAL DATA:  Restaging of metastatic colon cancer. EXAM: CT CHEST, ABDOMEN, AND PELVIS WITH CONTRAST TECHNIQUE: Multidetector CT imaging of the chest, abdomen and pelvis was performed following the standard protocol during bolus administration of intravenous contrast. CONTRAST:  130m OMNIPAQUE IOHEXOL 300 MG/ML  SOLN COMPARISON:  Multiple exams, including 06/17/2018 FINDINGS: CT CHEST FINDINGS Cardiovascular: Right Port-A-Cath tip: Lower SVC. Coronary, aortic arch, and branch vessel atherosclerotic vascular disease. Mediastinum/Nodes: Lower right paratracheal node 1.0 cm in short axis on image 26/2, formerly the same. Anterior pericardial node 1.1 cm in short axis on image 46/2, previously the same. Lungs/Pleura: New and enlarging bilateral pulmonary nodules randomly distributed throughout both lungs. Most are solid but a small minority have internal cavitation. An index lesion in the left upper lobe measures 2.5 by 2.0 cm on image 73/4, and previously measured 1.8 by 1.7 cm the overall appearance in the lungs is compatible with  significant progression. Musculoskeletal: 2.4 by 1.9 cm sclerotic lesion eccentric to the left in the T11 vertebral body, new compared to the prior exam, possibly associated with a new Schmorl's node for example on image 125/6. CT ABDOMEN PELVIS FINDINGS Hepatobiliary: Mixed response of metastatic lesions in the liver. Index lesion in the lateral segment left hepatic lobe has increased in size, measuring 7.6 by 6.3 cm, formerly 6.1 by 5.0. On the other hand, the dominant lesion in segment 4 of the liver is slightly reduced in prominence, measuring 7.5 by 5.7 cm previously 7.5 by 6.0 cm. This latter lesion partially includes the ablation site. Other lesions likewise demonstrate variable degree of mild enlargement or reduction in size. Pancreas: Unremarkable Spleen: Unremarkable Adrenals/Urinary Tract: Diffuse wall thickening of the urinary bladder may be from nondistention. Adrenal glands unremarkable. Stomach/Bowel: Prominent stool throughout the colon favors constipation. Right groin hernia contains loop of small bowel without complicating feature. Vascular/Lymphatic: Aortoiliac atherosclerotic vascular disease. Left periaortic adenopathy adjacent to the left adrenal gland 1.7 cm in short axis on image 64/2, formerly 1.4 cm. Celiac chain node 1.5 cm in short axis on image 61/2, formerly 1.0 cm. Reproductive: Unremarkable Other: There studding of tumor along the omentum. The thickest region of this omental studding is in the right upper quadrant and measures 1.2 cm in thickness, previously 2.1 cm in thickness. The overall degree of omental tumor caking is reduced compared to 06/16/2018. Musculoskeletal: A left direct inguinal hernia is present. The right groin hernia contains a loop of bowel and some fluid in addition to adipose tissue and extends medial to the inferior epigastric vessels, but also shows some narrowing of the right common femoral vein. Accordingly this may be either a femoral hernia, or a direct  inguinal hernia. Multilevel Schmorl's nodes in the lumbar spine. Stable lucent lesion medially in the left superior pubic ramus as on image 64/5. IMPRESSION: 1. Mixed response. Generally in the lungs disease is worse with new and enlarging pulmonary nodules. However, the omental studding of tumor is improved. Mixed response in the liver with some lesions larger and some lesions smaller. Mildly worsened upper abdominal adenopathy. 2. New primarily sclerotic lesion eccentric to the left in the T11 vertebral body, favor metastatic lesion, less likely reactive findings around a Schmorl's node. 3. Other imaging findings of potential clinical significance: Aortic Atherosclerosis (ICD10-I70.0). Coronary atherosclerosis. Prominent stool throughout the colon favors constipation. Bilateral groin hernias, containing a loop of small bowel on the right side without complicating feature. Electronically Signed   By: Van Clines M.D.   On: 10/21/2018 12:22   Ct Abdomen Pelvis W Contrast  Result Date: 10/21/2018 CLINICAL DATA:  Restaging of metastatic colon cancer. EXAM: CT CHEST, ABDOMEN, AND PELVIS WITH CONTRAST TECHNIQUE: Multidetector CT imaging of the chest, abdomen and pelvis was performed following the standard protocol during bolus administration of intravenous contrast. CONTRAST:  170m OMNIPAQUE IOHEXOL 300 MG/ML  SOLN COMPARISON:  Multiple exams, including 06/17/2018 FINDINGS: CT CHEST FINDINGS Cardiovascular: Right Port-A-Cath tip: Lower SVC. Coronary, aortic arch, and branch vessel atherosclerotic vascular disease. Mediastinum/Nodes: Lower right paratracheal node 1.0 cm in short axis on image 26/2, formerly the same. Anterior pericardial node 1.1 cm in short axis on image 46/2, previously the same. Lungs/Pleura: New and enlarging bilateral pulmonary nodules randomly distributed throughout both lungs. Most are solid but a small minority have internal cavitation. An index lesion in the left upper lobe measures  2.5 by 2.0 cm on image 73/4, and previously measured 1.8 by 1.7 cm  the overall appearance in the lungs is compatible with significant progression. Musculoskeletal: 2.4 by 1.9 cm sclerotic lesion eccentric to the left in the T11 vertebral body, new compared to the prior exam, possibly associated with a new Schmorl's node for example on image 125/6. CT ABDOMEN PELVIS FINDINGS Hepatobiliary: Mixed response of metastatic lesions in the liver. Index lesion in the lateral segment left hepatic lobe has increased in size, measuring 7.6 by 6.3 cm, formerly 6.1 by 5.0. On the other hand, the dominant lesion in segment 4 of the liver is slightly reduced in prominence, measuring 7.5 by 5.7 cm previously 7.5 by 6.0 cm. This latter lesion partially includes the ablation site. Other lesions likewise demonstrate variable degree of mild enlargement or reduction in size. Pancreas: Unremarkable Spleen: Unremarkable Adrenals/Urinary Tract: Diffuse wall thickening of the urinary bladder may be from nondistention. Adrenal glands unremarkable. Stomach/Bowel: Prominent stool throughout the colon favors constipation. Right groin hernia contains loop of small bowel without complicating feature. Vascular/Lymphatic: Aortoiliac atherosclerotic vascular disease. Left periaortic adenopathy adjacent to the left adrenal gland 1.7 cm in short axis on image 64/2, formerly 1.4 cm. Celiac chain node 1.5 cm in short axis on image 61/2, formerly 1.0 cm. Reproductive: Unremarkable Other: There studding of tumor along the omentum. The thickest region of this omental studding is in the right upper quadrant and measures 1.2 cm in thickness, previously 2.1 cm in thickness. The overall degree of omental tumor caking is reduced compared to 06/16/2018. Musculoskeletal: A left direct inguinal hernia is present. The right groin hernia contains a loop of bowel and some fluid in addition to adipose tissue and extends medial to the inferior epigastric vessels, but  also shows some narrowing of the right common femoral vein. Accordingly this may be either a femoral hernia, or a direct inguinal hernia. Multilevel Schmorl's nodes in the lumbar spine. Stable lucent lesion medially in the left superior pubic ramus as on image 64/5. IMPRESSION: 1. Mixed response. Generally in the lungs disease is worse with new and enlarging pulmonary nodules. However, the omental studding of tumor is improved. Mixed response in the liver with some lesions larger and some lesions smaller. Mildly worsened upper abdominal adenopathy. 2. New primarily sclerotic lesion eccentric to the left in the T11 vertebral body, favor metastatic lesion, less likely reactive findings around a Schmorl's node. 3. Other imaging findings of potential clinical significance: Aortic Atherosclerosis (ICD10-I70.0). Coronary atherosclerosis. Prominent stool throughout the colon favors constipation. Bilateral groin hernias, containing a loop of small bowel on the right side without complicating feature. Electronically Signed   By: Van Clines M.D.   On: 10/21/2018 12:22   Dg Chest Portable 1 View  Result Date: 10/24/2018 CLINICAL DATA:  Colon cancer chemotherapy, shortness of breath, fever, chest tightness EXAM: PORTABLE CHEST 1 VIEW COMPARISON:  CT chest, 10/21/2018 FINDINGS: The heart size and mediastinal contours are within normal limits. Right internal jugular port catheter. Numerous bilateral pulmonary masses and nodules. The visualized skeletal structures are unremarkable. IMPRESSION: Advanced pulmonary metastatic disease in keeping with findings of prior CT. There is no acute appearing airspace opacity in AP portable projection. Electronically Signed   By: Eddie Candle M.D.   On: 10/24/2018 20:20    Micro Results    Recent Results (from the past 240 hour(s))  Culture, blood (Routine x 2)     Status: None (Preliminary result)   Collection Time: 10/24/18  8:00 PM   Specimen: BLOOD RIGHT ARM  Result  Value Ref Range Status  Specimen Description BLOOD RIGHT ARM  Final   Special Requests   Final    BOTTLES DRAWN AEROBIC AND ANAEROBIC Blood Culture adequate volume   Culture   Final    NO GROWTH 3 DAYS Performed at [facility], [address] [place], [place] [address]    Report Status PENDING  Incomplete  Culture, blood (Routine x 2)     Status: None (Preliminary result)   Collection Time: 10/24/18  8:08 PM   Specimen: BLOOD LEFT HAND  Result Value Ref Range Status   Specimen Description BLOOD LEFT HAND  Final   Special Requests   Final    BOTTLES DRAWN AEROBIC AND ANAEROBIC Blood Culture results may not be optimal due to an excessive volume of blood received in culture bottles   Culture   Final    NO GROWTH 3 DAYS Performed at [facility], [address], [place], [place] [address]    Report Status PENDING  Incomplete  Urine Culture     Status: Abnormal   Collection Time: 10/24/18  8:15 PM   Specimen: Urine, Clean Catch  Result Value Ref Range Status   Specimen Description   Final    URINE, CLEAN CATCH Performed at [facility], [address], [place], [place] [address]    Special Requests   Final    NONE Performed at [facility], [address], [place], [place] [address]    Culture >=100,000 COLONIES/mL ESCHERICHIA COLI (A)  Final   Report Status 10/27/2018 FINAL  Final   Organism ID, Bacteria ESCHERICHIA COLI (A)  Final      Susceptibility   Escherichia coli - MIC*    AMPICILLIN 4 SENSITIVE Sensitive     CEFAZOLIN <=4 SENSITIVE Sensitive     CEFTRIAXONE <=1 SENSITIVE Sensitive     CIPROFLOXACIN <=0.25 SENSITIVE Sensitive     GENTAMICIN <=1 SENSITIVE Sensitive     IMIPENEM <=0.25 SENSITIVE Sensitive     NITROFURANTOIN <=16 SENSITIVE Sensitive     TRIMETH/SULFA <=20 SENSITIVE Sensitive     AMPICILLIN/SULBACTAM <=2 SENSITIVE Sensitive     PIP/TAZO <=4 SENSITIVE Sensitive     Extended ESBL NEGATIVE Sensitive     * >=100,000 COLONIES/mL ESCHERICHIA COLI  SARS  Coronavirus 2 (CEPHEID- Performed in [facility] hospital lab), Hosp Order     Status: None   Collection Time: 10/24/18  8:25 PM   Specimen: Urine, Clean Catch; Nasopharyngeal  Result Value Ref Range Status   SARS Coronavirus 2 NEGATIVE NEGATIVE Final    Comment: (NOTE) If result is NEGATIVE SARS-CoV-2 target nucleic acids are NOT DETECTED. The SARS-CoV-2 RNA is generally detectable in upper and lower  respiratory specimens during the acute phase of infection. The lowest  concentration of SARS-CoV-2 viral copies this assay can detect is 250  copies / mL. A negative result does not preclude SARS-CoV-2 infection  and should not be used as the sole basis for treatment or other  patient management decisions.  A negative result may occur with  improper specimen collection / handling, submission of specimen other  than nasopharyngeal swab, presence of viral mutation(s) within the  areas targeted by this assay, and inadequate number of viral copies  (<250 copies / mL). A negative result must be combined with clinical  observations, patient history, and epidemiological information. If result is POSITIVE SARS-CoV-2 target nucleic acids are DETECTED. The SARS-CoV-2 RNA is generally detectable in upper and lower  respiratory specimens dur ing the acute phase of infection.  Positive  results  are indicative of active infection with SARS-CoV-2.  Clinical  correlation with patient history and other diagnostic information is  necessary to determine patient infection status.  Positive results do  not rule out bacterial infection or co-infection with other viruses. If result is PRESUMPTIVE POSTIVE SARS-CoV-2 nucleic acids MAY BE PRESENT.   A presumptive positive result was obtained on the submitted specimen  and confirmed on repeat testing.  While 2019 novel coronavirus  (SARS-CoV-2) nucleic acids may be present in the submitted sample  additional confirmatory testing may be necessary for  epidemiological  and / or clinical management purposes  to differentiate between  SARS-CoV-2 and other Sarbecovirus currently known to infect humans.  If clinically indicated additional testing with an alternate test  methodology [phone]) is advised. The SARS-CoV-2 RNA is generally  detectable in upper and lower respiratory sp ecimens during the acute  phase of infection. The expected result is Negative. Fact Sheet for Patients:  StrictlyIdeas.no Fact Sheet for Healthcare Providers: BankingDealers.co.za This test is not yet approved or cleared by the [place] FDA and has been authorized for detection and/or diagnosis of SARS-CoV-2 by FDA under an Emergency Use Authorization (EUA).  This EUA will remain in effect (meaning this test can be used) for the duration of the COVID-19 declaration under Section 564(b)(1) of the Act, 21 U.S.C. section 360bbb-3(b)(1), unless the authorization is terminated or revoked sooner. Performed at [facility], [address] [place], [place] [address]        Today   Subjective    Ho Parisi today has no new complaints, No fever  Or chills ,  No Nausea, Vomiting or Diarrhea No flank pain, patient is eager to go home         Patient has been seen and examined prior to discharge   Objective   Blood pressure 113/62, pulse 73, temperature 99.2 F (37.3 C), temperature source Oral, resp. rate 20, height _0  (1.803 m), weight 90.3 kg, SpO2 97 %.   Intake/Output Summary (Last 24 hours) at 10/27/2018 0905 Last data filed at 10/26/2018 1817 Gross per 24 hour  Intake 960 ml  Output --  Net 960 ml    Exam Gen:- Awake Alert,  In no apparent distress  HEENT:- [place].AT, No sclera icterus Neck-Supple Neck,No JVD,.  Lungs-  CTAB , fair symmetrical air movement CV- S1, S2 normal, regular, right sided Port-A-Cath area nontender, no erythema and no obvious signs of infection Abd-  +ve B.Sounds,  Abd Soft, No tenderness, no CVA area tenderness Extremity/Skin:- No  edema, pedal pulses present  Psych-affect is appropriate, oriented x3 Neuro-no new focal deficits, no tremors   Data Review   CBC w Diff:  Lab Results  Component Value Date   WBC 7.9 10/26/2018   HGB 10.3 (L) 10/26/2018   HCT 32.1 (L) 10/26/2018   PLT 112 (L) 10/26/2018   LYMPHOPCT 7 10/24/2018   MONOPCT 7 10/24/2018   EOSPCT 0 10/24/2018   BASOPCT 0 10/24/2018    CMP:  Lab Results  Component Value Date   NA 138 10/25/2018   K 4.0 10/25/2018   CL 105 10/25/2018   CO2 26 10/25/2018   BUN 14 10/25/2018   CREATININE 0.91 10/25/2018   PROT 6.5 10/25/2018   ALBUMIN 3.1 (L) 10/25/2018   BILITOT 0.6 10/25/2018   ALKPHOS 139 (H) 10/25/2018   AST 21 10/25/2018   ALT 16 10/25/2018  .   Total Discharge time is about 33 minutes  Roxan Hockey M.D on  10/27/2018 at 9:05 AM  Go to [email or web address] -  for contact info  [facility] - Office  [phone]

## 2018-10-27 NOTE — Discharge Instructions (Signed)
1) you have E. coli UTI infection--- please complete Keflex/cephalexin antibiotic as prescribed 2) please follow-up with Dr. Delton Coombes next week to discuss further treatment for your cancer

## 2018-10-28 ENCOUNTER — Inpatient Hospital Stay (HOSPITAL_COMMUNITY): Payer: Medicare PPO | Attending: Hematology | Admitting: Hematology

## 2018-10-28 ENCOUNTER — Encounter (HOSPITAL_COMMUNITY): Payer: Self-pay | Admitting: Hematology

## 2018-10-28 ENCOUNTER — Other Ambulatory Visit: Payer: Self-pay

## 2018-10-28 VITALS — BP 133/82 | HR 75 | Temp 97.7°F | Resp 18 | Wt 201.6 lb

## 2018-10-28 DIAGNOSIS — E1142 Type 2 diabetes mellitus with diabetic polyneuropathy: Secondary | ICD-10-CM

## 2018-10-28 DIAGNOSIS — M542 Cervicalgia: Secondary | ICD-10-CM | POA: Diagnosis not present

## 2018-10-28 DIAGNOSIS — C189 Malignant neoplasm of colon, unspecified: Secondary | ICD-10-CM | POA: Diagnosis not present

## 2018-10-28 DIAGNOSIS — I10 Essential (primary) hypertension: Secondary | ICD-10-CM | POA: Diagnosis not present

## 2018-10-28 DIAGNOSIS — C7800 Secondary malignant neoplasm of unspecified lung: Secondary | ICD-10-CM | POA: Diagnosis not present

## 2018-10-28 DIAGNOSIS — M79652 Pain in left thigh: Secondary | ICD-10-CM | POA: Insufficient documentation

## 2018-10-28 DIAGNOSIS — C787 Secondary malignant neoplasm of liver and intrahepatic bile duct: Secondary | ICD-10-CM | POA: Insufficient documentation

## 2018-10-28 DIAGNOSIS — Z87891 Personal history of nicotine dependence: Secondary | ICD-10-CM | POA: Diagnosis not present

## 2018-10-28 DIAGNOSIS — R51 Headache: Secondary | ICD-10-CM | POA: Diagnosis not present

## 2018-10-28 MED ORDER — REGORAFENIB 40 MG PO TABS: 160.0000 mg | ORAL_TABLET | Freq: Every day | ORAL | 0 refills | Status: DC

## 2018-10-28 NOTE — Patient Instructions (Addendum)
[facility] at [facility] Discharge Instructions  You were seen today by Dr. Katragadda. He went over your recent lab results. He will see you back in 2 weeks for labs and follow up.   Thank you for choosing [facility] at [facility] to provide your oncology and hematology care.  To afford each patient quality time with our provider, please arrive at least 15 minutes before your scheduled appointment time.   If you have a lab appointment with the Cancer Center please come in thru the  Main Entrance and check in at the main information desk  You need to re-schedule your appointment should you arrive 10 or more minutes late.  We strive to give you quality time with our providers, and arriving late affects you and other patients whose appointments are after yours.  Also, if you no show three or more times for appointments you may be dismissed from the clinic at the providers discretion.     Again, thank you for choosing [facility].  Our hope is that these requests will decrease the amount of time that you wait before being seen by our physicians.       _____________________________________________________________  Should you have questions after your visit to [facility], please contact our office at [phone] between the hours of 8:00 a.m. and 4:30 p.m.  Voicemails left after 4:00 p.m. will not be returned until the following business day.  For prescription refill requests, have your pharmacy contact our office and allow 72 hours.    Cancer Center Support Programs:   > Cancer Support Group  2nd Tuesday of the month 1pm-2pm, Journey Room    

## 2018-10-28 NOTE — Assessment & Plan Note (Signed)
1.  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 - 12 cycles of FOLFIRI completed on 10/29/2017.  CT CAP showed stable left upper lobe subcentimeter lung nodule, segment 4 lesion in the liver has decreased to 2.4 cm.  -Left hepatic lobe microwave ablation on 12/10/2017.   -CT scan of the abdomen pelvis on 02/05/2018 showed left hepatic lesion with central high attenuation and a large area of low-attenuation.  There is a new left hepatic lobe lesion measuring 1.3 cm.  CT chest showed similar appearing small nodules. - MRI of the liver dated 02/25/2018 shows heterogeneous mass which surrounds the right side of the ablation defect and extends inferiorly in segment 4B measuring 6.9 x 5.6 cm, segment 2 lesion measuring 2.6 cm, segment 8 subcapsular 1.6 cm lesion.  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 -6 cycles of FOLFIRI from 03/02/2018 through 05/27/2018. - CT CAP on 06/16/2018 shows progression of pulmonary nodules bilaterally, largest left upper lobe pulmonary nodule measuring 1.9 cm and right upper lobe pulmonary nodule 1.8 cm.  Right cardiophrenic angle adenopathy is new at 1.3 cm.  2 liver lesions, largest measuring 7.4 x 6.4 cm, a second largest measuring 5.5 x 4.4 cm.  Both have increased in size.  Development of extensive omental/peritoneal metastasis.  An index right abdominal omental implant measures 2.2 x 3.3 cm.  No bone metastasis. - 7 cycles of FOLFOX (20% dose reduction) from 07/13/1998 through 10/13/2018. - He is not a candidate for Avastin because of prior bleed. - CEA was 209 on 10/13/2018, pretreatment level of 157. -She was recently admitted to the hospital from 10/24/2018 through 10/26/2018 with E. coli UTI.  He feels much  better in terms of energy.  He is taking Keflex 3 times daily which she will complete Sunday. - We reviewed results of the CT CAP from 10/21/2018.  It showed new and enlarging bilateral pulmonary nodules and mixed response in the liver.  He does not have any abdominal pains. -We had a prolonged discussion about scan results and need for changing therapy. - I have recommended starting Stivarga with ramp up dosing.  We talked about side effects in detail. - We have sent prescription to [facility] long outpatient pharmacy.  I will see him back 1 week after starting therapy.  2.  Peripheral neuropathy: -This is from diabetes.  Mild numbness in the feet predominantly at bedtime.  This has not worsened with oxaliplatin.  3.  Hypertension: -Blood pressure is well controlled with the valsartan HCTZ.

## 2018-10-28 NOTE — Progress Notes (Signed)
Christopher Friedman, [place] [address]   CLINIC:  Medical Oncology/Hematology  PCP:  [name], MD [place] [address] [phone]   REASON FOR VISIT:  Follow-up for metastatic colon cancer   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 [facility])  05/28/2017 - 06/09/2018 Chemotherapy   The patient had palonosetron (ALOXI) injection 0.25 mg, 0.25 mg, Intravenous,  Once, 18 of 22 cycles Administration: 0.25 mg (09/03/2017), 0.25 mg (10/01/2017), 0.25 mg (10/15/2017), 0.25 mg (10/29/2017), 0.25 mg (09/17/2017), 0.25 mg (03/02/2018), 0.25 mg (03/16/2018), 0.25 mg (03/30/2018), 0.25 mg (04/13/2018), 0.25 mg (05/13/2018), 0.25 mg (05/27/2018) irinotecan (CAMPTOSAR) 400 mg in dextrose 5 % 500 mL chemo infusion, 180 mg/m2, Intravenous,  Once, 18 of 22 cycles Dose modification: 144 mg/m2 (Cycle 4, Reason: Provider Judgment) Administration: 320 mg (09/03/2017), 320 mg (10/01/2017), 320 mg (10/15/2017), 320 mg (10/29/2017), 320 mg (09/17/2017), 320 mg (03/02/2018), 320 mg (03/16/2018), 320 mg (03/30/2018), 320 mg (04/13/2018), 320 mg (05/13/2018), 320 mg (05/27/2018) leucovorin 888 mg in dextrose 5 % 250 mL infusion, 400 mg/m2, Intravenous,  Once, 18 of 22 cycles Administration: 900 mg (09/03/2017), 900 mg (10/01/2017), 900 mg (10/15/2017), 900 mg (10/29/2017), 900 mg (09/17/2017), 900 mg (03/02/2018), 900 mg (03/16/2018), 900 mg (03/30/2018), 900 mg (04/13/2018), 900 mg (05/13/2018), 900 mg (05/27/2018) fluorouracil (ADRUCIL) chemo injection 900 mg, 400 mg/m2, Intravenous,  Once, 18 of 22 cycles Dose modification: 320 mg/m2 (Cycle 4, Reason: Provider Judgment) Administration: 700 mg (09/03/2017), 700 mg (10/01/2017), 700 mg (10/15/2017), 700 mg (10/29/2017), 700 mg (09/17/2017), 700 mg (03/02/2018), 700 mg (03/16/2018), 700 mg (03/30/2018), 700 mg (04/13/2018), 700 mg (05/13/2018), 700 mg (05/27/2018) fluorouracil (ADRUCIL) 5,350 mg in sodium chloride 0.9 % 143  mL chemo infusion, 2,400 mg/m2, Intravenous, 1 Day/Dose, 18 of 22 cycles Dose modification: 1,920 mg/m2 (Cycle 4, Reason: Provider Judgment) Administration: 4,250 mg (09/03/2017), 4,250 mg (10/01/2017), 4,250 mg (10/15/2017), 4,250 mg (10/29/2017), 4,250 mg (09/17/2017), 4,250 mg (03/02/2018), 4,250 mg (03/16/2018), 4,250 mg (03/30/2018), 4,250 mg (04/13/2018), 4,250 mg (05/13/2018), 4,250 mg (05/27/2018)  for chemotherapy treatment.    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 ([facility])   07/13/2018 -  Chemotherapy   The patient had palonosetron (ALOXI) injection 0.25 mg, 0.25 mg, Intravenous,  Once, 7 of 10 cycles Administration: 0.25 mg (07/13/2018), 0.25 mg (07/27/2018), 0.25 mg (08/10/2018), 0.25 mg (08/25/2018), 0.25 mg (09/09/2018), 0.25 mg (09/28/2018), 0.25 mg (10/13/2018) leucovorin 864 mg in dextrose 5 % 250 mL infusion, 400 mg/m2 = 864 mg, Intravenous,  Once, 7 of 10 cycles Administration: 864 mg (07/13/2018), 864 mg (07/27/2018), 864 mg (08/10/2018), 864 mg (08/25/2018), 864 mg (09/09/2018), 864 mg (09/28/2018), 864 mg (10/13/2018) oxaliplatin (ELOXATIN) 145 mg in dextrose 5 % 500 mL chemo infusion, 68 mg/m2 = 145 mg (80 % of original dose 85 mg/m2), Intravenous,  Once, 7 of 10 cycles Dose modification: 68 mg/m2 (80 % of original dose 85 mg/m2, Cycle 1, Reason: Other (see comments), Comment: Pre-existing neuropathy) Administration: 145 mg (07/13/2018), 145 mg (07/27/2018), 145 mg (08/10/2018), 145 mg (08/25/2018), 145 mg (09/09/2018), 145 mg (09/28/2018), 145 mg (10/13/2018) fluorouracil (ADRUCIL) chemo injection 700 mg, 320 mg/m2 = 700 mg (80 % of original dose 400 mg/m2), Intravenous,  Once, 7 of 10 cycles Dose modification: 320 mg/m2 (80 % of original dose 400 mg/m2, Cycle 1, Reason: Provider Judgment) Administration: 700 mg (07/13/2018), 700 mg (07/27/2018), 700 mg (08/10/2018), 700 mg (08/25/2018), 700 mg (09/09/2018), 700  mg (09/28/2018), 700 mg (10/13/2018) fluorouracil (ADRUCIL) 4,150 mg in  sodium chloride 0.9 % 67 mL chemo infusion, 1,920 mg/m2 = 4,150 mg (80 % of original dose 2,400 mg/m2), Intravenous, 1 Day/Dose, 7 of 10 cycles Dose modification: 1,920 mg/m2 (80 % of original dose 2,400 mg/m2, Cycle 1, Reason: Provider Judgment), 1,920 mg/m2 (original dose 2,400 mg/m2, Cycle 4, Reason: Provider Judgment) Administration: 4,150 mg (07/13/2018), 4,150 mg (08/10/2018), 4,150 mg (07/27/2018), 4,150 mg (08/25/2018), 4,150 mg (09/09/2018), 4,150 mg (09/28/2018), 4,150 mg (10/13/2018)  for chemotherapy treatment.       CANCER STAGING: Cancer Staging Adenocarcinoma of colon metastatic to liver [facility]) Staging form: Colon and Rectum, AJCC 8th Edition - Clinical stage from 07/13/2017: Stage IVB (pM1b) - Signed by Derek Jack, MD on 07/13/2017    INTERVAL HISTORY:  Christopher Friedman 68 y.o. male seen for follow-up of metastatic colon cancer.  He was recently hospitalized with urinary infection for 2 days.  He is on Keflex 3 times a day at this time.  He feels much better in terms of energy.  Appetite is 75%.  Energy levels are 50%.  No pain reported.  Denies any right upper quadrant pain.  Occasional headaches present.  Last chemotherapy was on 10/13/2018.  Denies any fevers or chills since discharge.  He had a CT scan done on 10/21/2018.      REVIEW OF SYSTEMS:  Review of Systems  Constitutional: Negative for fatigue.  Neurological: Positive for headaches and numbness.  All other systems reviewed and are negative.    PAST MEDICAL/SURGICAL HISTORY:  Past Medical History:  Diagnosis Date  . Colon cancer ([place])   . Diabetes ([place])   . GERD (gastroesophageal reflux disease)   . Hypertension    Past Surgical History:  Procedure Laterality Date  . APPENDECTOMY     during colon surgery  . COLON SURGERY    . IR RADIOLOGIST EVAL & MGMT  11/26/2017  . RADIOFREQUENCY ABLATION N/A 12/10/2017   Procedure: CT MICROWAVE THERMAL ABLATION (LIVER);  Surgeon: Markus Daft, MD;  Location: [facility] ORS;   Service: Anesthesiology;  Laterality: N/A;  . SHOULDER SURGERY  2003     SOCIAL HISTORY:  Social History   Socioeconomic History  . Marital status: Married    Spouse name: Not on file  . Number of children: Not on file  . Years of education: Not on file  . Highest education level: Not on file  Occupational History  . Not on file  Social Needs  . Financial resource strain: Not on file  . Food insecurity    Worry: Not on file    Inability: Not on file  . Transportation needs    Medical: Not on file    Non-medical: Not on file  Tobacco Use  . Smoking status: Former Smoker    Quit date: 04/05/1995    Years since quitting: 23.5  . Smokeless tobacco: Never Used  Substance and Sexual Activity  . Alcohol use: Not Currently    Alcohol/week: 0.0 standard drinks    Comment: used to drink but stopped about 20 years ago  . Drug use: Never  . Sexual activity: Not on file  Lifestyle  . Physical activity    Days per week: Not on file    Minutes per session: Not on file  . Stress: Not on file  Relationships  . Social Herbalist on phone: Not on file    Gets together: Not on file    Attends religious  service: Not on file    Active member of club or organization: Not on file    Attends meetings of clubs or organizations: Not on file    Relationship status: Not on file  . Intimate partner violence    Fear of current or ex partner: Not on file    Emotionally abused: Not on file    Physically abused: Not on file    Forced sexual activity: Not on file  Other Topics Concern  . Not on file  Social History Narrative  . Not on file    FAMILY HISTORY:  Family History  Problem Relation Age of Onset  . Diabetes Mother   . Cancer Father   . Bone cancer Father   . Colon cancer Neg Hx   . Colon polyps Neg Hx     CURRENT MEDICATIONS:  Outpatient Encounter Medications as of 10/28/2018  Medication Sig  . acetaminophen (TYLENOL) 325 MG tablet Take 2 tablets (650 mg total) by  mouth every 6 (six) hours as needed for mild pain, fever or headache.  . Alpha-Lipoic Acid 200 MG CAPS Take 1 capsule by mouth daily.  . Ascorbic Acid (VITAMIN C) 1000 MG tablet Take 2,000 mg by mouth daily.   . ASHWAGANDHA PO Take 800 mg by mouth daily.  Marland Kitchen aspirin 81 MG tablet Take 1 tablet (81 mg total) by mouth daily with breakfast.  . atorvastatin (LIPITOR) 10 MG tablet Take 10 mg by mouth daily.   Marland Kitchen BEE POLLEN PO Take 1 capsule by mouth daily.   . Blood Glucose Monitoring Suppl (ONE TOUCH ULTRA MINI) W/DEVICE KIT USE TO CHECK BLOOD SUGAR DAILY.  . cephALEXin (KEFLEX) 500 MG capsule Take 1 capsule (500 mg total) by mouth 3 (three) times daily for 5 days.  . cholecalciferol (VITAMIN D) 1000 units tablet Take 5,000 Units by mouth daily.   Marland Kitchen CINNAMON PO Take 2,000 mg by mouth daily.   Marland Kitchen Cod Liver Oil CAPS Take 1 capsule by mouth daily.  . Coenzyme Q10 (CO Q 10) 100 MG CAPS Take 1 capsule by mouth daily.   . Cyanocobalamin (VITAMIN B-12) 5000 MCG TBDP Take 5,000 mcg by mouth daily.  . cyclobenzaprine (FLEXERIL) 10 MG tablet TAKE ONE TABLET BY MOUTH EVERY EVENING AT BEDTIME AS NEEDED FOR MUSCLE SPASMS.  Marland Kitchen diphenoxylate-atropine (LOMOTIL) 2.5-0.025 MG tablet Take 1 tablet by mouth every 6 (six) hours as needed for diarrhea or loose stools (stomach cramps).  . Flax Oil-Fish Oil-Borage Oil (FISH OIL-FLAX OIL-BORAGE OIL) CAPS Take 1 capsule by mouth daily.  . fluorouracil CALGB 35329 in sodium chloride 0.9 % 150 mL Inject into the vein over 96 hr.  . Garlic 9242 MG CAPS Take 2 capsules by mouth daily.   Marland Kitchen glipiZIDE (GLUCOTROL XL) 10 MG 24 hr tablet Take 10 mg by mouth daily.   . Glucosamine-Chondroit-Collagen ([employer] GLUCO-CHONDROIT PLUS UC-II PO) Take 1 tablet by mouth daily.   . IRINOTECAN HCL IV Inject into the vein every 14 (fourteen) days.   Marland Kitchen LECITHIN PO Take 1 capsule by mouth daily. 1200 mg  . LEUCOVORIN CALCIUM IV Inject into the vein every 14 (fourteen) days.   Marland Kitchen lidocaine-prilocaine (EMLA)  cream Apply small amount over port site and cover with plastic wrap one hour prior to appointment.  . magnesium oxide (MAG-OX) 400 MG tablet Take 400 mg by mouth daily.   . metFORMIN (GLUCOPHAGE) 500 MG tablet Take 1 tablet (500 mg total) by mouth 2 (two) times daily with a meal. (  Patient taking differently: Take 1,000 mg by mouth 2 (two) times daily with a meal. )  . milk thistle 175 MG tablet Take 175 mg by mouth daily.  . ondansetron (ZOFRAN) 4 MG tablet Take 1 tablet (4 mg total) by mouth every 6 (six) hours as needed for nausea.  Marland Kitchen OVER THE COUNTER MEDICATION Take 1 capsule by mouth daily. Mushroom Complex 750 mg  . OVER THE COUNTER MEDICATION Take 1 capsule by mouth daily. Cura Med 750 mg  . oxyCODONE 10 MG TABS Take 1 tablet (10 mg total) by mouth 2 (two) times daily as needed for severe pain.  Marland Kitchen prochlorperazine (COMPAZINE) 10 MG tablet Take 1 tablet (10 mg total) by mouth every 6 (six) hours as needed for nausea or vomiting.  . valsartan-hydrochlorothiazide (DIOVAN HCT) 160-12.5 MG tablet Take 1 tablet by mouth daily.  . regorafenib (STIVARGA) 40 MG tablet Take 4 tablets (160 mg total) by mouth daily with breakfast. Start taking 2 tablets daily for the first week, increase it to 3 tablets daily during second and third weeks.  Take 3 weeks on 1 week off.  Take with low-fat meal.   Facility-Administered Encounter Medications as of 10/28/2018  Medication  . sodium chloride flush (NS) 0.9 % injection 10 mL    ALLERGIES:  No Known Allergies   PHYSICAL EXAM:  ECOG Performance status: 1  Vitals:   10/28/18 1432  BP: 133/82  Pulse: 75  Resp: 18  Temp: 97.7 F (36.5 C)  SpO2: 96%   Filed Weights   10/28/18 1432  Weight: 201 lb 9.6 oz (91.4 kg)    Physical Exam Vitals signs reviewed.  Constitutional:      Appearance: Normal appearance.  Cardiovascular:     Rate and Rhythm: Normal rate and regular rhythm.     Heart sounds: Normal heart sounds.  Pulmonary:     Effort:  Pulmonary effort is normal.     Breath sounds: Normal breath sounds.  Abdominal:     General: There is no distension.     Palpations: Abdomen is soft. There is no mass.  Musculoskeletal:        General: No swelling.  Skin:    General: Skin is warm.  Neurological:     General: No focal deficit present.     Mental Status: He is alert and oriented to person, place, and time.  Psychiatric:        Mood and Affect: Mood normal.        Behavior: Behavior normal.      LABORATORY DATA:  I have reviewed the labs as listed.  CBC    Component Value Date/Time   WBC 7.9 10/26/2018 0450   RBC 3.41 (L) 10/26/2018 0450   HGB 10.3 (L) 10/26/2018 0450   HCT 32.1 (L) 10/26/2018 0450   PLT 112 (L) 10/26/2018 0450   MCV 94.1 10/26/2018 0450   MCH 30.2 10/26/2018 0450   MCHC 32.1 10/26/2018 0450   RDW 17.0 (H) 10/26/2018 0450   LYMPHSABS 0.9 10/24/2018 1956   MONOABS 0.9 10/24/2018 1956   EOSABS 0.0 10/24/2018 1956   BASOSABS 0.0 10/24/2018 1956   CMP Latest Ref Rng & Units 10/25/2018 10/24/2018 10/13/2018  Glucose 70 - 99 mg/dL 93 192(H) 147(H)  BUN 8 - 23 mg/dL _0 Creatinine 0.61 - 1.24 mg/dL 0.91 1.27(H) 1.07  Sodium 135 - 145 mmol/L 138 132(L) 137  Potassium 3.5 - 5.1 mmol/L 4.0 3.9 4.2  Chloride 98 - 111 mmol/L  105 97(L) 98  CO2 22 - 32 mmol/L _0 Calcium 8.9 - 10.3 mg/dL 8.6(L) 9.0 9.8  Total Protein 6.5 - 8.1 g/dL 6.5 7.2 8.0  Total Bilirubin 0.3 - 1.2 mg/dL 0.6 0.9 0.7  Alkaline Phos 38 - 126 U/L 139(H) 166(H) 161(H)  AST 15 - 41 U/L 21 27 34  ALT 0 - 44 U/L _1 DIAGNOSTIC IMAGING:  I have independently reviewed the scans and discussed with the patient.   I have reviewed Venita Lick LPN's note and agree with the documentation.  I personally performed a face-to-face visit, made revisions and my assessment and plan is as follows.    ASSESSMENT & PLAN:   Adenocarcinoma of colon metastatic to liver ([place]) 1.  Stage IV colon cancer with liver and  lung metastasis: -Foundation 1 test results show K-ras G 12 D, MS stable, TMB omuts/mb, IYME158, PTEN R233, SMAD4 loss, TP53 R342 -Sigmoid colon segmental resection on 07/12/2015, T3 N2 M1 with synchronous liver and lung metastasis. - 12 cycles of FOLFIRI completed on 10/29/2017.  CT CAP showed stable left upper lobe subcentimeter lung nodule, segment 4 lesion in the liver has decreased to 2.4 cm.  -Left hepatic lobe microwave ablation on 12/10/2017.   -CT scan of the abdomen pelvis on 02/05/2018 showed left hepatic lesion with central high attenuation and a large area of low-attenuation.  There is a new left hepatic lobe lesion measuring 1.3 cm.  CT chest showed similar appearing small nodules. - MRI of the liver dated 02/25/2018 shows heterogeneous mass which surrounds the right side of the ablation defect and extends inferiorly in segment 4B measuring 6.9 x 5.6 cm, segment 2 lesion measuring 2.6 cm, segment 8 subcapsular 1.6 cm lesion.  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 -6 cycles of FOLFIRI from 03/02/2018 through 05/27/2018. - CT CAP on 06/16/2018 shows progression of pulmonary nodules bilaterally, largest left upper lobe pulmonary nodule measuring 1.9 cm and right upper lobe pulmonary nodule 1.8 cm.  Right cardiophrenic angle adenopathy is new at 1.3 cm.  2 liver lesions, largest measuring 7.4 x 6.4 cm, a second largest measuring 5.5 x 4.4 cm.  Both have increased in size.  Development of extensive omental/peritoneal metastasis.  An index right abdominal omental implant measures 2.2 x 3.3 cm.  No bone metastasis. - 7 cycles of FOLFOX (20% dose reduction) from 07/13/1998 through 10/13/2018. - He is not a candidate for Avastin because of prior bleed. - CEA was 209 on 10/13/2018, pretreatment level of 157. -She was recently admitted to the hospital from 10/24/2018 through 10/26/2018 with E. coli UTI.  He feels much better in terms of energy.  He is taking  Keflex 3 times daily which she will complete Sunday. - We reviewed results of the CT CAP from 10/21/2018.  It showed new and enlarging bilateral pulmonary nodules and mixed response in the liver.  He does not have any abdominal pains. -We had a prolonged discussion about scan results and need for changing therapy. - I have recommended starting Stivarga with ramp up dosing.  We talked about side effects in detail. - We have sent prescription to [facility] long outpatient pharmacy.  I will see him back 1 week after starting therapy.  2.  Peripheral neuropathy: -This is from diabetes.  Mild numbness in the feet predominantly at bedtime.  This has not worsened with oxaliplatin.  3.  Hypertension: -Blood pressure is  well controlled with the valsartan HCTZ.      Total time spent is 40 minutes with more than 50% of the time spent face-to-face discussing CT scan results, change in therapy, side effects and counseling and coordination of care.  Orders placed this encounter:  Orders Placed This Encounter  Procedures  . CBC with Differential/Platelet  . Comprehensive metabolic panel  . Magnesium      Derek Jack, MD [place] [phone]

## 2018-10-29 ENCOUNTER — Encounter (HOSPITAL_COMMUNITY): Payer: Medicare PPO

## 2018-10-29 ENCOUNTER — Telehealth (HOSPITAL_COMMUNITY): Payer: Self-pay | Admitting: Pharmacist

## 2018-10-29 ENCOUNTER — Telehealth (HOSPITAL_COMMUNITY): Payer: Self-pay | Admitting: Pharmacy Technician

## 2018-10-29 DIAGNOSIS — C189 Malignant neoplasm of colon, unspecified: Secondary | ICD-10-CM

## 2018-10-29 LAB — CULTURE, BLOOD (ROUTINE X 2)
Culture: NO GROWTH
Culture: NO GROWTH
Special Requests: ADEQUATE

## 2018-10-29 MED ORDER — REGORAFENIB 40 MG PO TABS: ORAL_TABLET | ORAL | 0 refills | Status: AC

## 2018-10-29 NOTE — Telephone Encounter (Signed)
Oral Oncology Patient Advocate Encounter  Received notification from [facility] that prior authorization for Christopher Friedman is required.  PA submitted on CoverMyMeds Key [id number] Status is pending  Oral Oncology Clinic will continue to follow.  [name] Patient [name] Phone [phone] Fax [phone] 10/29/2018 2:45 PM

## 2018-10-29 NOTE — Telephone Encounter (Signed)
Oral Oncology Pharmacist Encounter  Received new prescription for Stivarga (regorafenib) for the treatment of metastatic colon cancer, planned duration until disease progression or unacceptable drug toxicity.  CMP from 10/25/2018 assessed, no relevant lab abnormalities. Prescription dose and frequency assessed. Dose escalation planned for the Stivarga in help increased medication tolerance.   Current medication list in Epic reviewed, no DDIs with regorafenib identified.  Prescription has been e-scribed to the [facility] for benefits analysis and approval.  Oral Oncology Clinic will continue to follow for insurance authorization, copayment issues, initial counseling and start date.  Darl Pikes, PharmD, BCPS, [facility] Hematology/Oncology Clinical Pharmacist [facility]/AP Oral [place] [facility] [phone]  10/29/2018 1:12 PM

## 2018-10-29 NOTE — Telephone Encounter (Signed)
Oral Oncology Patient Advocate Encounter  Prior Authorization for Christopher Friedman has been approved.    PA# [id number] Effective dates: 10/29/2018 through 04/27/2019  Patients co-pay is $2757.62.  Will apply for manufacturer assistance through [employer] (BUSPAF).  Oral Oncology Clinic will continue to follow.   [place] Patient Christopher Friedman Phone [phone] Fax [phone] 10/29/2018 2:50 PM

## 2018-11-02 NOTE — Telephone Encounter (Deleted)
11/02/2018- left vmail on patients phone.

## 2018-11-03 ENCOUNTER — Other Ambulatory Visit (HOSPITAL_COMMUNITY): Payer: Medicare PPO

## 2018-11-03 ENCOUNTER — Ambulatory Visit (HOSPITAL_COMMUNITY): Payer: Medicare PPO

## 2018-11-03 ENCOUNTER — Ambulatory Visit (HOSPITAL_COMMUNITY): Payer: Medicare PPO | Admitting: Hematology

## 2018-11-03 NOTE — Telephone Encounter (Deleted)
Spoke to patient and his wife, Rise Paganini, yesterday 7/6 and verified email to send patient portion of application.  Wife stated that she could print and scan back completed form.

## 2018-11-04 ENCOUNTER — Telehealth (HOSPITAL_COMMUNITY): Payer: Self-pay | Admitting: Pharmacy Technician

## 2018-11-04 NOTE — Telephone Encounter (Signed)
Oral Oncology Patient Advocate Encounter  Spoke to patient and his wife on Monday 11/02/2018 regarding patient assistance application for Stivarga.  I emailed the application for assistance to them and they faxed it back to me on 11/03/2018.  Completed application was faxed to [phone] on 11/03/2018.   [employer] patient assistance phone number for follow up is [phone].   This encounter will be updated until final determination.   [place] [facility] Patient [place] Phone [phone] Fax [phone]

## 2018-11-04 NOTE — Telephone Encounter (Signed)
Oral Oncology Patient Advocate Encounter  Received notification from [employer] [place] [employer] that patient has been successfully enrolled into their program to receive Stivarga from the manufacturer at $0 out of pocket until 04/29/2019.    I called and spoke with patient.  He knows we will have to re-apply.   Patient knows to call the office with questions or concerns.   Oral Oncology Clinic will continue to follow.  [place] Patient [place] Phone [phone] Fax [phone] 11/04/2018 8:48 AM

## 2018-11-05 ENCOUNTER — Encounter (HOSPITAL_COMMUNITY): Payer: Medicare PPO

## 2018-11-09 ENCOUNTER — Encounter (HOSPITAL_COMMUNITY): Payer: Self-pay | Admitting: Emergency Medicine

## 2018-11-09 ENCOUNTER — Emergency Department (HOSPITAL_COMMUNITY): Payer: Medicare PPO

## 2018-11-09 ENCOUNTER — Emergency Department (HOSPITAL_COMMUNITY)
Admission: EM | Admit: 2018-11-09 | Discharge: 2018-11-09 | Disposition: A | Discharge status: Home / Self Care | Payer: Medicare PPO | Attending: Emergency Medicine | Admitting: Emergency Medicine

## 2018-11-09 ENCOUNTER — Telehealth (HOSPITAL_COMMUNITY): Payer: Self-pay | Admitting: *Deleted

## 2018-11-09 ENCOUNTER — Other Ambulatory Visit: Payer: Self-pay

## 2018-11-09 DIAGNOSIS — M542 Cervicalgia: Secondary | ICD-10-CM | POA: Diagnosis not present

## 2018-11-09 DIAGNOSIS — Z79899 Other long term (current) drug therapy: Secondary | ICD-10-CM | POA: Diagnosis not present

## 2018-11-09 DIAGNOSIS — Z7982 Long term (current) use of aspirin: Secondary | ICD-10-CM | POA: Diagnosis not present

## 2018-11-09 DIAGNOSIS — R51 Headache: Secondary | ICD-10-CM | POA: Insufficient documentation

## 2018-11-09 DIAGNOSIS — C189 Malignant neoplasm of colon, unspecified: Secondary | ICD-10-CM | POA: Insufficient documentation

## 2018-11-09 DIAGNOSIS — I10 Essential (primary) hypertension: Secondary | ICD-10-CM | POA: Diagnosis not present

## 2018-11-09 DIAGNOSIS — G8929 Other chronic pain: Secondary | ICD-10-CM

## 2018-11-09 DIAGNOSIS — E119 Type 2 diabetes mellitus without complications: Secondary | ICD-10-CM | POA: Diagnosis not present

## 2018-11-09 DIAGNOSIS — Z87891 Personal history of nicotine dependence: Secondary | ICD-10-CM | POA: Insufficient documentation

## 2018-11-09 DIAGNOSIS — Z7984 Long term (current) use of oral hypoglycemic drugs: Secondary | ICD-10-CM | POA: Diagnosis not present

## 2018-11-09 MED ORDER — HYDROCODONE-ACETAMINOPHEN 5-325 MG PO TABS: ORAL_TABLET | ORAL | 0 refills | Status: DC

## 2018-11-09 MED ORDER — OXYCODONE-ACETAMINOPHEN 5-325 MG PO TABS
1.0000 | ORAL_TABLET | Freq: Once | ORAL | Status: AC
  Administered 2018-11-09: 14:00:00 1 via ORAL
  Filled 2018-11-09: qty 1

## 2018-11-09 NOTE — Telephone Encounter (Signed)
Patient's wife called stating that patient has had a headache for approximately 9 days.  He was determined to go out and mow the grass in the heat.  He started to develop the headache after that and he though was a "crook in his neck".  He denies any blurred vision, no dizziness, no slurred speech.  His only report is excruciating pain behind both ears behind his head.  He is unable to function due to the headache.    I have advised his wife to take him to the emergency room for evaluation of this headache.  She verbalizes understanding.   I have called the ER and given report to Gerda Diss, RN charge.

## 2018-11-09 NOTE — Discharge Instructions (Addendum)
Your CT scan showed an incidental finding(s):  "Mild erosive changes in the anterior aspect of C2 suspicious for metastatic disease. Nonemergent MRI may be helpful for further evaluation." Take the prescription as directed.  Call your regular medical doctor and your Oncologist today to schedule a follow up appointment this week.  Return to the Emergency Department immediately sooner if worsening.

## 2018-11-09 NOTE — ED Provider Notes (Signed)
[facility] Provider Note   CSN: [id number] Arrival date & time: 11/09/18  1129     History   Chief Complaint Chief Complaint  Patient presents with  . Headache    HPI Christopher Friedman is a 68 y.o. male.     HPI Pt was seen at 1335. Per pt, c/o gradual onset and persistence of constant "headache" for the past 1 month.  Describes the headache unchanged over the past 1 month. Pt was evaluated several times by his PMD and Oncologist for this complaint, rx "some pain medicine."  Denies headache was sudden or maximal in onset or at any time.  Denies visual changes, no focal motor weakness, no tingling/numbness in extremities, no fevers, no neck pain, no rash, no CP/SOB, no abd pain, no N/V/D.      Past Medical History:  Diagnosis Date  . Colon cancer ([place])   . Diabetes ([place])   . GERD (gastroesophageal reflux disease)   . Hypertension     Patient Active Problem List   Diagnosis Date Noted  . Acute lower UTI 10/25/2018  . Diabetes ([place]) 10/25/2018  . HTN (hypertension) 10/25/2018  . Sepsis ([place]) 10/24/2018  . Goals of care, counseling/discussion 06/18/2018  . Metastatic colon cancer to liver ([place]) 12/10/2017  . Adenocarcinoma of colon metastatic to liver ([place]) 07/03/2017  . 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 GERD (gastroesophageal reflux disease) 04/05/2015    Past Surgical History:  Procedure Laterality Date  . APPENDECTOMY     during colon surgery  . COLON SURGERY    . IR RADIOLOGIST EVAL & MGMT  11/26/2017  . RADIOFREQUENCY ABLATION N/A 12/10/2017   Procedure: CT MICROWAVE THERMAL ABLATION (LIVER);  Surgeon: Markus Daft, MD;  Location: [facility];  Service: Anesthesiology;  Laterality: N/A;  . SHOULDER SURGERY  2003        Home Medications    Prior to Admission medications   Medication Sig Start Date End Date Taking? Authorizing Provider  acetaminophen (TYLENOL) 325 MG tablet Take 2 tablets (650 mg total) by mouth every 6 (six) hours  as needed for mild pain, fever or headache. 10/27/18   Denton Brick, Courage, MD  Alpha-Lipoic Acid 200 MG CAPS Take 1 capsule by mouth daily.    [provider]  Ascorbic Acid (VITAMIN C) 1000 MG tablet Take 2,000 mg by mouth daily.     [provider]  ASHWAGANDHA PO Take 800 mg by mouth daily.    [provider]  aspirin 81 MG tablet Take 1 tablet (81 mg total) by mouth daily with breakfast. 10/27/18   Emokpae, Courage, MD  atorvastatin (LIPITOR) 10 MG tablet Take 10 mg by mouth daily.  07/22/17   [provider]  BEE POLLEN PO Take 1 capsule by mouth daily.     [provider]  Blood Glucose Monitoring Suppl (ONE TOUCH ULTRA MINI) W/DEVICE KIT USE TO CHECK BLOOD SUGAR DAILY. 01/05/15   [provider]  cholecalciferol (VITAMIN D) 1000 units tablet Take 5,000 Units by mouth daily.     [provider]  CINNAMON PO Take 2,000 mg by mouth daily.     [provider]  [facility] Liver Oil CAPS Take 1 capsule by mouth daily.    [provider]  Coenzyme Q10 (CO Q 10) 100 MG CAPS Take 1 capsule by mouth daily.     [provider]  Cyanocobalamin (VITAMIN B-12) 5000 MCG TBDP Take 5,000 mcg by mouth daily.    [provider]  cyclobenzaprine (FLEXERIL) 10 MG tablet TAKE ONE TABLET BY MOUTH EVERY EVENING AT BEDTIME AS NEEDED FOR MUSCLE SPASMS. 07/07/18   [provider]  diphenoxylate-atropine (LOMOTIL) 2.5-0.025 MG tablet Take 1 tablet by mouth every 6 (six) hours as needed for diarrhea or loose stools (stomach cramps). 03/18/18   Derek Jack, MD  Flax Oil-Fish Oil-Borage Oil (FISH OIL-FLAX OIL-BORAGE OIL) CAPS Take 1 capsule by mouth daily.    [provider]  Garlic 6226 MG CAPS Take 2 capsules by mouth daily.     [provider]  glipiZIDE (GLUCOTROL XL) 10 MG 24 hr tablet Take 10 mg by mouth daily.  07/22/17   [provider]  Glucosamine-Chondroit-Collagen ([employer] GLUCO-CHONDROIT  PLUS UC-II PO) Take 1 tablet by mouth daily.     [provider]  LECITHIN PO Take 1 capsule by mouth daily. 1200 mg    [provider]  lidocaine-prilocaine (EMLA) cream Apply small amount over port site and cover with plastic wrap one hour prior to appointment. 02/27/18   Derek Jack, MD  magnesium oxide (MAG-OX) 400 MG tablet Take 400 mg by mouth daily.     [provider]  metFORMIN (GLUCOPHAGE) 500 MG tablet Take 1 tablet (500 mg total) by mouth 2 (two) times daily with a meal. Patient taking differently: Take 1,000 mg by mouth 2 (two) times daily with a meal.  08/06/17   Derek Jack, MD  milk thistle 175 MG tablet Take 175 mg by mouth daily.    [provider]  ondansetron (ZOFRAN) 4 MG tablet Take 1 tablet (4 mg total) by mouth every 6 (six) hours as needed for nausea. 10/27/18   Roxan Hockey, MD  OVER THE COUNTER MEDICATION Take 1 capsule by mouth daily. Mushroom Complex 750 mg    [provider]  OVER THE COUNTER MEDICATION Take 1 capsule by mouth daily. Cura Med 750 mg    [provider]  oxyCODONE 10 MG TABS Take 1 tablet (10 mg total) by mouth 2 (two) times daily as needed for severe pain. 07/13/18   Derek Jack, MD  prochlorperazine (COMPAZINE) 10 MG tablet Take 1 tablet (10 mg total) by mouth every 6 (six) hours as needed for nausea or vomiting. 02/27/18   Derek Jack, MD  regorafenib (STIVARGA) 40 MG tablet Start taking 2 tablets daily for the first week, increase it to 3 tablets daily during second and third weeks.  Take 3 weeks on 1 week off.  Take with low-fat meal. 10/29/18   Derek Jack, MD  valsartan-hydrochlorothiazide (DIOVAN HCT) 160-12.5 MG tablet Take 1 tablet by mouth daily. 10/27/18 10/27/19  Roxan Hockey, MD    Family History Family History  Problem Relation Age of Onset  . Diabetes Mother   . Cancer Father   . Bone cancer Father   . Colon cancer Neg Hx   . Colon  polyps Neg Hx     Social History Social History   Tobacco Use  . Smoking status: Former Smoker    Quit date: 04/05/1995    Years since quitting: 23.6  . Smokeless tobacco: Never Used  Substance Use Topics  . Alcohol use: Not Currently    Alcohol/week: 0.0 standard drinks    Comment: used to drink but stopped about 20 years ago  . Drug use: Never     Allergies   Patient has no known allergies.   Review of Systems Review of Systems ROS: Statement: All systems negative except as marked  or noted in the HPI; Constitutional: Negative for fever and chills. ; ; Eyes: Negative for eye pain, redness and discharge. ; ; ENMT: Negative for ear pain, hoarseness, nasal congestion, sinus pressure and sore throat. ; ; Cardiovascular: Negative for chest pain, palpitations, diaphoresis, dyspnea and peripheral edema. ; ; Respiratory: Negative for cough, wheezing and stridor. ; ; Gastrointestinal: Negative for nausea, vomiting, diarrhea, abdominal pain, blood in stool, hematemesis, jaundice and rectal bleeding. . ; ; Genitourinary: Negative for dysuria, flank pain and hematuria. ; ; Musculoskeletal: Negative for back pain and neck pain. Negative for swelling and trauma.; ; Skin: Negative for pruritus, rash, abrasions, blisters, bruising and skin lesion.; ; Neuro: +headache. Negative for lightheadedness and neck stiffness. Negative for weakness, altered level of consciousness, altered mental status, extremity weakness, paresthesias, involuntary movement, seizure and syncope.       Physical Exam Updated Vital Signs BP 126/89 (BP Location: Right Arm)   Pulse (!) 103   Temp 98.3 F (36.8 C) (Oral)   Resp 18   Ht '5\' 11"'  (1.803 m)   Wt 86.2 kg   SpO2 93%   BMI 26.50 kg/m   Physical Exam 1340: Physical examination:  Nursing notes reviewed; Vital signs and O2 SAT reviewed;  Constitutional: Well developed, Well nourished, Well hydrated, In no acute distress; Head:  Normocephalic, atraumatic; Eyes:  EOMI, PERRL, No scleral icterus; ENMT: Mouth and pharynx normal, Mucous membranes moist; Neck: Supple, Full range of motion, No lymphadenopathy; Cardiovascular: Regular rate and rhythm, No gallop; Respiratory: Breath sounds clear & equal bilaterally, No wheezes.  Speaking full sentences with ease, Normal respiratory effort/excursion; Chest: Nontender, Movement normal; Abdomen: Soft, Nontender, Nondistended, Normal bowel sounds; Genitourinary: No CVA tenderness; Spine:  No midline CS, TS, LS tenderness. +TTP bilat hypertonic trapezius muscles. No rash.;; Extremities: Peripheral pulses normal, No tenderness, No edema, No calf edema or asymmetry.; Neuro: AA&Ox3, Major CN grossly intact. No facial droop. Speech clear. No gross focal motor or sensory deficits in extremities.; Skin: Color normal, Warm, Dry.   ED Treatments / Results  Labs (all labs ordered are listed, but only abnormal results are displayed)   EKG None  Radiology   Procedures Procedures (including critical care time)  Medications Ordered in ED Medications  oxyCODONE-acetaminophen (PERCOCET/ROXICET) 5-325 MG per tablet 1 tablet (1 tablet Oral Given 11/09/18 1405)     Initial Impression / Assessment and Plan / ED Course  I have reviewed the triage vital signs and the nursing notes.  Pertinent labs & imaging results that were available during my care of the patient were reviewed by me and considered in my medical decision making (see chart for details).     MDM Reviewed: previous chart, nursing note and vitals Interpretation: CT scan    Ct Head Wo Contrast Result Date: 11/09/2018 CLINICAL DATA:  Chronic headaches for 1 month with neck pain, history of metastatic colon carcinoma, initial encounter EXAM: CT HEAD WITHOUT CONTRAST CT CERVICAL SPINE WITHOUT CONTRAST TECHNIQUE: Multidetector CT imaging of the head and cervical spine was performed following the standard protocol without intravenous contrast. Multiplanar CT image  reconstructions of the cervical spine were also generated. COMPARISON:  None. FINDINGS: CT HEAD FINDINGS Brain: No evidence of acute infarction, hemorrhage, hydrocephalus, extra-axial collection or mass lesion/mass effect. Vascular: No hyperdense vessel or unexpected calcification. Skull: Normal. Negative for fracture or focal lesion. Sinuses/Orbits: No acute finding. Other: None. CT CERVICAL SPINE FINDINGS Alignment: Within normal limits. Skull base and vertebrae: 7 cervical segments are well visualized. Vertebral body  height is well maintained. Osteophytic changes are noted at C5-6 and C6-7 with disc space narrowing at C6-7 identified. No acute fracture or acute facet abnormality is noted. Lucency is noted within the anterior aspect of the C2 vertebral body eccentric to the right with some cortical erosion identified. These changes are suspicious for metastatic disease. Soft tissues and spinal canal: Surrounding soft tissue structures are within normal limits. No lymphadenopathy is seen. Right chest wall port is noted. Upper chest: Multiple pulmonary nodules are noted consistent with metastatic disease. Other: None IMPRESSION: CT of the head: No acute intracranial abnormality is noted. CT of the cervical spine: Degenerative changes are identified. No acute fracture is seen. Mild erosive changes in the anterior aspect of C2 suspicious for metastatic disease. Nonemergent MRI may be helpful for further evaluation. Multiple metastatic lesions throughout the lung apices. Electronically Signed   By: Inez Catalina M.D.   On: 11/09/2018 14:51     Ct Cervical Spine Wo Contrast Result Date: 11/09/2018 CLINICAL DATA:  Chronic headaches for 1 month with neck pain, history of metastatic colon carcinoma, initial encounter EXAM: CT HEAD WITHOUT CONTRAST CT CERVICAL SPINE WITHOUT CONTRAST TECHNIQUE: Multidetector CT imaging of the head and cervical spine was performed following the standard protocol without intravenous  contrast. Multiplanar CT image reconstructions of the cervical spine were also generated. COMPARISON:  None. FINDINGS: CT HEAD FINDINGS Brain: No evidence of acute infarction, hemorrhage, hydrocephalus, extra-axial collection or mass lesion/mass effect. Vascular: No hyperdense vessel or unexpected calcification. Skull: Normal. Negative for fracture or focal lesion. Sinuses/Orbits: No acute finding. Other: None. CT CERVICAL SPINE FINDINGS Alignment: Within normal limits. Skull base and vertebrae: 7 cervical segments are well visualized. Vertebral body height is well maintained. Osteophytic changes are noted at C5-6 and C6-7 with disc space narrowing at C6-7 identified. No acute fracture or acute facet abnormality is noted. Lucency is noted within the anterior aspect of the C2 vertebral body eccentric to the right with some cortical erosion identified. These changes are suspicious for metastatic disease. Soft tissues and spinal canal: Surrounding soft tissue structures are within normal limits. No lymphadenopathy is seen. Right chest wall port is noted. Upper chest: Multiple pulmonary nodules are noted consistent with metastatic disease. Other: None IMPRESSION: CT of the head: No acute intracranial abnormality is noted. CT of the cervical spine: Degenerative changes are identified. No acute fracture is seen. Mild erosive changes in the anterior aspect of C2 suspicious for metastatic disease. Nonemergent MRI may be helpful for further evaluation. Multiple metastatic lesions throughout the lung apices. Electronically Signed   By: Inez Catalina M.D.   On: 11/09/2018 14:51   Christopher Friedman was evaluated in Emergency Department on 11/09/2018 for the symptoms described in the history of present illness. He was evaluated in the context of the global COVID-19 pandemic, which necessitated consideration that the patient might be at risk for infection with the SARS-CoV-2 virus that causes COVID-19. Institutional protocols and  algorithms that pertain to the evaluation of patients at risk for COVID-19 are in a state of rapid change based on information released by regulatory bodies including the CDC and federal and state organizations. These policies and algorithms were followed during the patient's care in the ED.    1500:  Pt feels better after meds and wants to go home now. Neuro exam remains intact/unchanged. CT as above; will need f/u with Onc MD for MRI. Dx and testing, as well as incidental finding(s), d/w pt.  Questions answered.  Verb understanding,  agreeable to d/c home with outpt f/u.     Final Clinical Impressions(s) / ED Diagnoses   Final diagnoses:  None    ED Discharge Orders    None       Francine Graven, DO 11/13/18 1538

## 2018-11-09 NOTE — ED Triage Notes (Signed)
He states that he has had a headache for the past couple weeks. He had chemo about three weeks ago.

## 2018-11-10 ENCOUNTER — Other Ambulatory Visit (HOSPITAL_COMMUNITY): Payer: Self-pay | Admitting: *Deleted

## 2018-11-10 ENCOUNTER — Telehealth (HOSPITAL_COMMUNITY): Payer: Self-pay | Admitting: *Deleted

## 2018-11-10 DIAGNOSIS — R519 Headache, unspecified: Secondary | ICD-10-CM

## 2018-11-10 DIAGNOSIS — C189 Malignant neoplasm of colon, unspecified: Secondary | ICD-10-CM

## 2018-11-10 NOTE — Telephone Encounter (Signed)
Called patient's wife to let her know Dr.Katragadda ordered a MRI of the brain for this patient and scheduling will call with time and date of the MRI. Wife verbalized understanding.

## 2018-11-12 ENCOUNTER — Ambulatory Visit (HOSPITAL_COMMUNITY)
Admission: RE | Admit: 2018-11-12 | Discharge: 2018-11-12 | Disposition: A | Discharge status: Home / Self Care | Payer: Medicare PPO | Source: Ambulatory Visit | Attending: Hematology | Admitting: Hematology

## 2018-11-12 ENCOUNTER — Other Ambulatory Visit: Payer: Self-pay

## 2018-11-12 DIAGNOSIS — C787 Secondary malignant neoplasm of liver and intrahepatic bile duct: Secondary | ICD-10-CM | POA: Diagnosis not present

## 2018-11-12 DIAGNOSIS — R200 Anesthesia of skin: Secondary | ICD-10-CM | POA: Diagnosis not present

## 2018-11-12 DIAGNOSIS — R51 Headache: Secondary | ICD-10-CM | POA: Insufficient documentation

## 2018-11-12 DIAGNOSIS — C189 Malignant neoplasm of colon, unspecified: Secondary | ICD-10-CM | POA: Diagnosis not present

## 2018-11-12 DIAGNOSIS — R519 Headache, unspecified: Secondary | ICD-10-CM

## 2018-11-12 MED ORDER — GADOBUTROL 1 MMOL/ML IV SOLN
7.5000 mL | Freq: Once | INTRAVENOUS | Status: AC | PRN
  Administered 2018-11-12: 7.5 mL via INTRAVENOUS

## 2018-11-13 ENCOUNTER — Other Ambulatory Visit: Payer: Self-pay

## 2018-11-13 ENCOUNTER — Inpatient Hospital Stay (HOSPITAL_BASED_OUTPATIENT_CLINIC_OR_DEPARTMENT_OTHER): Payer: Medicare PPO | Admitting: Hematology

## 2018-11-13 DIAGNOSIS — G629 Polyneuropathy, unspecified: Secondary | ICD-10-CM

## 2018-11-13 DIAGNOSIS — C189 Malignant neoplasm of colon, unspecified: Secondary | ICD-10-CM | POA: Diagnosis not present

## 2018-11-13 DIAGNOSIS — I10 Essential (primary) hypertension: Secondary | ICD-10-CM

## 2018-11-13 DIAGNOSIS — C787 Secondary malignant neoplasm of liver and intrahepatic bile duct: Secondary | ICD-10-CM

## 2018-11-13 MED ORDER — OXYCODONE HCL ER 10 MG PO T12A: 10.0000 mg | EXTENDED_RELEASE_TABLET | Freq: Two times a day (BID) | ORAL | 0 refills | Status: AC

## 2018-11-13 NOTE — Progress Notes (Signed)
I connected with Christopher Friedman on 11/13/18 at 11:10 AM EDT by telephone visit and verified that I am speaking with the correct person using two identifiers.   I discussed the limitations, risks, security and privacy concerns of performing an evaluation and management service by telemedicine and the availability of in-person appointments. I also discussed with the patient that there may be a patient responsible charge related to this service. The patient expressed understanding and agreed to proceed.   Other persons participating in the visit and their role in the encounter: Patient's wife    Patient's location: Home   Provider's location: [place]    Chief Complaint:  Patient reports overall doing fair.  His main concern today is pain.  He states his pain is localized to the base of his skull.  He is taking 2 hydrocodone's with some relief.  But he states hydrocodone does not last him 4 hours.  He is inquiring about a longer acting pain medication.  He is eating and drinking adequately.  He is also inquiring about his MRI results.  He has not received his shipment of Stivarga at this time.  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 - 12 cycles of FOLFIRI completed on 10/29/2017.  CT CAP showed stable left upper lobe subcentimeter lung nodule, segment 4 lesion in the liver has decreased to 2.4 cm.  -Left hepatic lobe microwave ablation on 12/10/2017.   -CT scan of the abdomen pelvis on 02/05/2018 showed left hepatic lesion with central high attenuation and a large area of low-attenuation.  There is a new left hepatic lobe lesion measuring 1.3 cm.  CT chest showed similar appearing small nodules. - MRI of the liver dated 02/25/2018 shows  heterogeneous mass which surrounds the right side of the ablation defect and extends inferiorly in segment 4B measuring 6.9 x 5.6 cm, segment 2 lesion measuring 2.6 cm, segment 8 subcapsular 1.6 cm lesion.  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 -6 cycles of FOLFIRI from 03/02/2018 through 05/27/2018. - CT CAP on 06/16/2018 shows progression of pulmonary nodules bilaterally, largest left upper lobe pulmonary nodule measuring 1.9 cm and right upper lobe pulmonary nodule 1.8 cm.  Right cardiophrenic angle adenopathy is new at 1.3 cm.  2 liver lesions, largest measuring 7.4 x 6.4 cm, a second largest measuring 5.5 x 4.4 cm.  Both have increased in size.  Development of extensive omental/peritoneal metastasis.  An index right abdominal omental implant measures 2.2 x 3.3 cm.  No bone metastasis. - 7 cycles of FOLFOX (20% dose reduction) from 07/13/1998 through 10/13/2018. - He is not a candidate for Avastin because of prior bleed. - CEA was 209 on 10/13/2018, pretreatment level of 157. -She was recently admitted to the hospital from 10/24/2018 through 10/26/2018 with E. coli UTI.  He feels much better in terms of energy.  He is taking Keflex 3 times daily which she will complete Sunday. - CT CAP from 10/21/2018.  It showed new and enlarging bilateral pulmonary nodules and mixed response in the liver.  He does not have any abdominal pains. - Recommended to change therapy to Stivarga due to disease progression. We talked about side effects in detail. - He has not received his prescription  for Stivarga.  - He did present to the emergency room on November 09, 2018 with reports of a constant headache and with associated pain at this base of his skull.  CT of cervical spine was completed and showed a lucency within the anterior aspect of C2 vertebral body eccentric to the right with some cortical erosion which was concerning for metastatic disease.  CT of the head was  negative. - MRI of the brain was then completed on November 12, 2018 which showed no acute or significant findings.  No evidence of metastatic disease. -Continues with reports of pain at the base of the skull.  This is somewhat relieved with hydrocodone.  He states hydrocodone does not last more than 4 hours.  And he is often doubling up on his medications.  He has not denies any loss of motor function or sensory function.  Have recommended patient start OxyContin 10 mg every 12 hours and use hydrocodone for breakthrough pain. -Patient will return to clinic next week for labs and provider visit.  We will also plan to start Stivarga once he receives medication.  2.  Peripheral neuropathy: -This is from diabetes.  Mild numbness in the feet predominantly at bedtime.  This has not worsened with oxaliplatin.  3.  Hypertension: -Blood pressure is well controlled with the valsartan HCTZ.

## 2018-11-18 ENCOUNTER — Inpatient Hospital Stay (HOSPITAL_BASED_OUTPATIENT_CLINIC_OR_DEPARTMENT_OTHER): Payer: Medicare PPO | Admitting: Hematology

## 2018-11-18 ENCOUNTER — Inpatient Hospital Stay (HOSPITAL_COMMUNITY): Payer: Medicare PPO

## 2018-11-18 ENCOUNTER — Other Ambulatory Visit: Payer: Self-pay

## 2018-11-18 ENCOUNTER — Ambulatory Visit (HOSPITAL_COMMUNITY)
Admission: RE | Admit: 2018-11-18 | Discharge: 2018-11-18 | Disposition: A | Discharge status: Home / Self Care | Payer: Medicare PPO | Source: Ambulatory Visit | Attending: Hematology | Admitting: Hematology

## 2018-11-18 ENCOUNTER — Encounter (HOSPITAL_COMMUNITY): Payer: Self-pay | Admitting: Hematology

## 2018-11-18 VITALS — BP 104/78 | HR 106 | Temp 97.7°F | Resp 18 | Wt 191.6 lb

## 2018-11-18 DIAGNOSIS — C7800 Secondary malignant neoplasm of unspecified lung: Secondary | ICD-10-CM | POA: Diagnosis not present

## 2018-11-18 DIAGNOSIS — E1142 Type 2 diabetes mellitus with diabetic polyneuropathy: Secondary | ICD-10-CM | POA: Diagnosis not present

## 2018-11-18 DIAGNOSIS — M79652 Pain in left thigh: Secondary | ICD-10-CM | POA: Diagnosis not present

## 2018-11-18 DIAGNOSIS — C189 Malignant neoplasm of colon, unspecified: Secondary | ICD-10-CM

## 2018-11-18 DIAGNOSIS — Z87891 Personal history of nicotine dependence: Secondary | ICD-10-CM

## 2018-11-18 DIAGNOSIS — M542 Cervicalgia: Secondary | ICD-10-CM | POA: Diagnosis not present

## 2018-11-18 DIAGNOSIS — I10 Essential (primary) hypertension: Secondary | ICD-10-CM | POA: Diagnosis not present

## 2018-11-18 DIAGNOSIS — C787 Secondary malignant neoplasm of liver and intrahepatic bile duct: Secondary | ICD-10-CM | POA: Diagnosis not present

## 2018-11-18 DIAGNOSIS — R51 Headache: Secondary | ICD-10-CM | POA: Diagnosis not present

## 2018-11-18 LAB — COMPREHENSIVE METABOLIC PANEL
ALT: 24 U/L (ref 0–44)
AST: 34 U/L (ref 15–41)
Albumin: 3.5 g/dL (ref 3.5–5.0)
Alkaline Phosphatase: 281 U/L — ABNORMAL HIGH (ref 38–126)
Anion gap: 12 (ref 5–15)
BUN: 19 mg/dL (ref 8–23)
CO2: 25 mmol/L (ref 22–32)
Calcium: 10 mg/dL (ref 8.9–10.3)
Chloride: 95 mmol/L — ABNORMAL LOW (ref 98–111)
Creatinine, Ser: 0.99 mg/dL (ref 0.61–1.24)
GFR calc Af Amer: >60 mL/min (ref >60)
GFR calc non Af Amer: >60 mL/min (ref >60)
Glucose, Bld: 196 mg/dL — ABNORMAL HIGH (ref 70–99)
Potassium: 4.1 mmol/L (ref 3.5–5.1)
Sodium: 132 mmol/L — ABNORMAL LOW (ref 135–145)
Total Bilirubin: 0.8 mg/dL (ref 0.3–1.2)
Total Protein: 8.2 g/dL — ABNORMAL HIGH (ref 6.5–8.1)

## 2018-11-18 LAB — CBC WITH DIFFERENTIAL/PLATELET
Abs Immature Granulocytes: 0.07 10*3/uL (ref 0.00–0.07)
Basophils Absolute: 0.1 10*3/uL (ref 0.0–0.1)
Basophils Relative: 1 %
Eosinophils Absolute: 0.3 10*3/uL (ref 0.0–0.5)
Eosinophils Relative: 3 %
HCT: 39.1 % (ref 39.0–52.0)
Hemoglobin: 12.8 g/dL — ABNORMAL LOW (ref 13.0–17.0)
Immature Granulocytes: 1 %
Lymphocytes Relative: 17 %
Lymphs Abs: 1.6 10*3/uL (ref 0.7–4.0)
MCH: 29.9 pg (ref 26.0–34.0)
MCHC: 32.7 g/dL (ref 30.0–36.0)
MCV: 91.4 fL (ref 80.0–100.0)
Monocytes Absolute: 1 10*3/uL (ref 0.1–1.0)
Monocytes Relative: 10 %
Neutro Abs: 6.7 10*3/uL (ref 1.7–7.7)
Neutrophils Relative %: 68 %
Platelets: 215 10*3/uL (ref 150–400)
RBC: 4.28 MIL/uL (ref 4.22–5.81)
RDW: 14.6 % (ref 11.5–15.5)
WBC: 9.8 10*3/uL (ref 4.0–10.5)
nRBC: 0 % (ref 0.0–0.2)

## 2018-11-18 LAB — MAGNESIUM: Magnesium: 1.8 mg/dL (ref 1.7–2.4)

## 2018-11-18 MED ORDER — GADOBUTROL 1 MMOL/ML IV SOLN
9.0000 mL | Freq: Once | INTRAVENOUS | Status: AC | PRN
  Administered 2018-11-18: 9 mL via INTRAVENOUS

## 2018-11-18 NOTE — Assessment & Plan Note (Addendum)
1.  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 -7 cycles of FOLFOX from 07/13/2018 through 10/13/2018 with progression. - CT CAP on 10/21/2018 shows new and enlarging bilateral pulmonary nodules, mixed response in the liver. - Admitted from 10/24/2018 through 10/26/2018 with E. coli UTI. - Came to the ER on 11/09/2018 with severe posterior neck pain.  CT of the head was negative.  CT C-spine showed mild erosive changes in anterior aspect of the C2. - MRI of the brain on 11/12/2018 was negative. - He is continuing to have severe pain in the back of the neck.  He is taking oxycodone 10 mg half tablet every 4 hours as needed. -Wife reports that his temperature is going up to 99.5 in the evenings.  He denies any chills.  No urinary tract symptoms.  He is lying in the bed for most part of the day due to pain.  Denies any GI symptoms. -I have recommended doing an MRI of the C-spine with and without contrast to further evaluate the C2 lesion to differentiate from osteomyelitis versus metastatic disease.  Labs show calcium of 10 which is slightly higher than his baseline. - I have also sent a prescription for Stivarga at prior visit for his metastatic disease.  We will follow-up on it. -We will see him back after MRI.

## 2018-11-18 NOTE — Progress Notes (Signed)
Christopher Friedman, [name] [id number]   CLINIC:  Medical Oncology/Hematology  PCP:  [name], MD Christopher Friedman [id number] [phone]   REASON FOR VISIT:  Follow-up for metastatic colon cancer and neck pain.   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 ([place])  05/28/2017 - 06/09/2018 Chemotherapy   The patient had palonosetron (ALOXI) injection 0.25 mg, 0.25 mg, Intravenous,  Once, 18 of 22 cycles Administration: 0.25 mg (09/03/2017), 0.25 mg (10/01/2017), 0.25 mg (10/15/2017), 0.25 mg (10/29/2017), 0.25 mg (09/17/2017), 0.25 mg (03/02/2018), 0.25 mg (03/16/2018), 0.25 mg (03/30/2018), 0.25 mg (04/13/2018), 0.25 mg (05/13/2018), 0.25 mg (05/27/2018) irinotecan (CAMPTOSAR) 400 mg in dextrose 5 % 500 mL chemo infusion, 180 mg/m2, Intravenous,  Once, 18 of 22 cycles Dose modification: 144 mg/m2 (Cycle 4, Reason: Provider Judgment) Administration: 320 mg (09/03/2017), 320 mg (10/01/2017), 320 mg (10/15/2017), 320 mg (10/29/2017), 320 mg (09/17/2017), 320 mg (03/02/2018), 320 mg (03/16/2018), 320 mg (03/30/2018), 320 mg (04/13/2018), 320 mg (05/13/2018), 320 mg (05/27/2018) leucovorin 888 mg in dextrose 5 % 250 mL infusion, 400 mg/m2, Intravenous,  Once, 18 of 22 cycles Administration: 900 mg (09/03/2017), 900 mg (10/01/2017), 900 mg (10/15/2017), 900 mg (10/29/2017), 900 mg (09/17/2017), 900 mg (03/02/2018), 900 mg (03/16/2018), 900 mg (03/30/2018), 900 mg (04/13/2018), 900 mg (05/13/2018), 900 mg (05/27/2018) fluorouracil (ADRUCIL) chemo injection 900 mg, 400 mg/m2, Intravenous,  Once, 18 of 22 cycles Dose modification: 320 mg/m2 (Cycle 4, Reason: Provider Judgment) Administration: 700 mg (09/03/2017), 700 mg (10/01/2017), 700 mg (10/15/2017), 700 mg (10/29/2017), 700 mg (09/17/2017), 700 mg (03/02/2018), 700 mg (03/16/2018), 700 mg (03/30/2018), 700 mg (04/13/2018), 700 mg (05/13/2018), 700 mg (05/27/2018) fluorouracil (ADRUCIL) 5,350 mg in sodium  chloride 0.9 % 143 mL chemo infusion, 2,400 mg/m2, Intravenous, 1 Day/Dose, 18 of 22 cycles Dose modification: 1,920 mg/m2 (Cycle 4, Reason: Provider Judgment) Administration: 4,250 mg (09/03/2017), 4,250 mg (10/01/2017), 4,250 mg (10/15/2017), 4,250 mg (10/29/2017), 4,250 mg (09/17/2017), 4,250 mg (03/02/2018), 4,250 mg (03/16/2018), 4,250 mg (03/30/2018), 4,250 mg (04/13/2018), 4,250 mg (05/13/2018), 4,250 mg (05/27/2018)  for chemotherapy treatment.    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 (Christopher Friedman)   07/13/2018 -  Chemotherapy   The patient had palonosetron (ALOXI) injection 0.25 mg, 0.25 mg, Intravenous,  Once, 7 of 10 cycles Administration: 0.25 mg (07/13/2018), 0.25 mg (07/27/2018), 0.25 mg (08/10/2018), 0.25 mg (08/25/2018), 0.25 mg (09/09/2018), 0.25 mg (09/28/2018), 0.25 mg (10/13/2018) leucovorin 864 mg in dextrose 5 % 250 mL infusion, 400 mg/m2 = 864 mg, Intravenous,  Once, 7 of 10 cycles Administration: 864 mg (07/13/2018), 864 mg (07/27/2018), 864 mg (08/10/2018), 864 mg (08/25/2018), 864 mg (09/09/2018), 864 mg (09/28/2018), 864 mg (10/13/2018) oxaliplatin (ELOXATIN) 145 mg in dextrose 5 % 500 mL chemo infusion, 68 mg/m2 = 145 mg (80 % of original dose 85 mg/m2), Intravenous,  Once, 7 of 10 cycles Dose modification: 68 mg/m2 (80 % of original dose 85 mg/m2, Cycle 1, Reason: Other (see comments), Comment: Pre-existing neuropathy) Administration: 145 mg (07/13/2018), 145 mg (07/27/2018), 145 mg (08/10/2018), 145 mg (08/25/2018), 145 mg (09/09/2018), 145 mg (09/28/2018), 145 mg (10/13/2018) fluorouracil (ADRUCIL) chemo injection 700 mg, 320 mg/m2 = 700 mg (80 % of original dose 400 mg/m2), Intravenous,  Once, 7 of 10 cycles Dose modification: 320 mg/m2 (80 % of original dose 400 mg/m2, Cycle 1, Reason: Provider Judgment) Administration: 700 mg (07/13/2018), 700 mg (07/27/2018), 700 mg (08/10/2018), 700 mg (08/25/2018), 700  mg (09/09/2018), 700 mg (09/28/2018), 700 mg (10/13/2018) fluorouracil  (ADRUCIL) 4,150 mg in sodium chloride 0.9 % 67 mL chemo infusion, 1,920 mg/m2 = 4,150 mg (80 % of original dose 2,400 mg/m2), Intravenous, 1 Day/Dose, 7 of 10 cycles Dose modification: 1,920 mg/m2 (80 % of original dose 2,400 mg/m2, Cycle 1, Reason: Provider Judgment), 1,920 mg/m2 (original dose 2,400 mg/m2, Cycle 4, Reason: Provider Judgment) Administration: 4,150 mg (07/13/2018), 4,150 mg (08/10/2018), 4,150 mg (07/27/2018), 4,150 mg (08/25/2018), 4,150 mg (09/09/2018), 4,150 mg (09/28/2018), 4,150 mg (10/13/2018)  for chemotherapy treatment.       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 68 y.o. male seen for follow-up of metastatic colon cancer neck pain.  He has been having neck pain for the last couple of weeks.  He came to the ER on 11/09/2018 with severe pain and a CT of the head was negative.  CT of the neck showed erosive changes in the anterior aspect of the C2 concerning for metastatic disease.  He was sent home on hydrocodone.  He was taking hydrocodone 5 mg every 4 hours which was helping.  He ran out of hydrocodone.  He is currently taking oxycodone 10 mg half tablet, and the pain is controlled for 3 hours.  He was given prescription for OxyContin which his insurance requested prior authorization.  Denies any vision changes.  Denies any nausea, vomiting, diarrhea or constipation.  Has chronic knee pain on the left side.  Otherwise no other pains.  He has mild constipation from the pain medication.      REVIEW OF SYSTEMS:  Review of Systems  Constitutional: Positive for fever.  Gastrointestinal: Positive for constipation.  Neurological: Positive for headaches and numbness.  Psychiatric/Behavioral: Positive for sleep disturbance.  All other systems reviewed and are  negative.    PAST MEDICAL/SURGICAL HISTORY:  Past Medical History:  Diagnosis Date  . Colon cancer (Christopher Friedman)   . Diabetes (Christopher Friedman)   . GERD (gastroesophageal reflux disease)   . Hypertension    Past Surgical History:  Procedure Laterality Date  . APPENDECTOMY     during colon surgery  . COLON SURGERY    . IR RADIOLOGIST EVAL & MGMT  11/26/2017  . RADIOFREQUENCY ABLATION N/A 12/10/2017   Procedure: CT MICROWAVE THERMAL ABLATION (LIVER);  Surgeon: Markus Daft, MD;  Location: [facility];  Service: Anesthesiology;  Laterality: N/A;  . SHOULDER SURGERY  2003     SOCIAL HISTORY:  Social History   Socioeconomic History  . Marital status: Married    Spouse name: Not on file  . Number of children: Not on file  . Years of education: Not on file  . Highest education level: Not on file  Occupational History  . Not on file  Social Needs  . Financial resource strain: Not on file  . Food insecurity    Worry: Not on file    Inability: Not on file  . Transportation needs    Medical: Not on file    Non-medical: Not on file  Tobacco Use  . Smoking status: Former Smoker    Quit date: 04/05/1995    Years since quitting: 23.6  . Smokeless tobacco: Never Used  Substance and Sexual Activity  . Alcohol use: Not Currently    Alcohol/week: 0.0 standard drinks  Comment: used to drink but stopped about 20 years ago  . Drug use: Never  . Sexual activity: Not on file  Lifestyle  . Physical activity    Days per week: Not on file    Minutes per session: Not on file  . Stress: Not on file  Relationships  . Social Herbalist on phone: Not on file    Gets together: Not on file    Attends religious service: Not on file    Active member of club or organization: Not on file    Attends meetings of clubs or organizations: Not on file    Relationship status: Not on file  . Intimate partner violence    Fear of current or ex partner: Not on file    Emotionally abused: Not on file    Physically  abused: Not on file    Forced sexual activity: Not on file  Other Topics Concern  . Not on file  Social History Narrative  . Not on file    FAMILY HISTORY:  Family History  Problem Relation Age of Onset  . Diabetes Mother   . Cancer Father   . Bone cancer Father   . Colon cancer Neg Hx   . Colon polyps Neg Hx     CURRENT MEDICATIONS:  Outpatient Encounter Medications as of 11/18/2018  Medication Sig  . acetaminophen (TYLENOL) 325 MG tablet Take 2 tablets (650 mg total) by mouth every 6 (six) hours as needed for mild pain, fever or headache.  . Alpha-Lipoic Acid 200 MG CAPS Take 1 capsule by mouth daily.  . Ascorbic Acid (VITAMIN C) 1000 MG tablet Take 2,000 mg by mouth daily.   . ASHWAGANDHA PO Take 800 mg by mouth daily.  Marland Kitchen aspirin 81 MG tablet Take 1 tablet (81 mg total) by mouth daily with breakfast.  . ASPIRIN ADULT LOW STRENGTH 81 MG EC tablet Take 81 mg by mouth daily with breakfast.  . atorvastatin (LIPITOR) 10 MG tablet Take 10 mg by mouth daily.   Marland Kitchen BEE POLLEN PO Take 1 capsule by mouth daily.   . Blood Glucose Monitoring Suppl (ONE TOUCH ULTRA MINI) W/DEVICE KIT USE TO CHECK BLOOD SUGAR DAILY.  . cholecalciferol (VITAMIN D) 1000 units tablet Take 5,000 Units by mouth daily.   Marland Kitchen CINNAMON PO Take 2,000 mg by mouth daily.   Marland Kitchen Cod Liver Oil CAPS Take 1 capsule by mouth daily.  . Coenzyme Q10 (CO Q 10) 100 MG CAPS Take 1 capsule by mouth daily.   . Cyanocobalamin (VITAMIN B-12) 5000 MCG TBDP Take 5,000 mcg by mouth daily.  . cyclobenzaprine (FLEXERIL) 10 MG tablet TAKE ONE TABLET BY MOUTH EVERY EVENING AT BEDTIME AS NEEDED FOR MUSCLE SPASMS.  Marland Kitchen diphenoxylate-atropine (LOMOTIL) 2.5-0.025 MG tablet Take 1 tablet by mouth every 6 (six) hours as needed for diarrhea or loose stools (stomach cramps).  . Flax Oil-Fish Oil-Borage Oil (FISH OIL-FLAX OIL-BORAGE OIL) CAPS Take 1 capsule by mouth daily.  . Garlic 6789 MG CAPS Take 2 capsules by mouth daily.   Marland Kitchen glipiZIDE (GLUCOTROL XL)  10 MG 24 hr tablet Take 10 mg by mouth daily.   . Glucosamine-Chondroit-Collagen ([employer] GLUCO-CHONDROIT PLUS UC-II PO) Take 1 tablet by mouth daily.   Marland Kitchen HYDROcodone-acetaminophen (NORCO/VICODIN) 5-325 MG tablet 1 or 2 tabs PO q12 hours prn pain  . LECITHIN PO Take 1 capsule by mouth daily. 1200 mg  . lidocaine-prilocaine (EMLA) cream Apply small amount over port site and cover  with plastic wrap one hour prior to appointment.  . magnesium oxide (MAG-OX) 400 MG tablet Take 400 mg by mouth daily.   . metFORMIN (GLUCOPHAGE) 500 MG tablet Take 1 tablet (500 mg total) by mouth 2 (two) times daily with a meal. (Patient taking differently: Take 1,000 mg by mouth 2 (two) times daily with a meal. )  . milk thistle 175 MG tablet Take 175 mg by mouth daily.  . ondansetron (ZOFRAN) 4 MG tablet Take 1 tablet (4 mg total) by mouth every 6 (six) hours as needed for nausea.  Marland Kitchen OVER THE COUNTER MEDICATION Take 1 capsule by mouth daily. Mushroom Complex 750 mg  . OVER THE COUNTER MEDICATION Take 1 capsule by mouth daily. Cura Med 750 mg  . oxyCODONE (OXYCONTIN) 10 mg 12 hr tablet Take 1 tablet (10 mg total) by mouth every 12 (twelve) hours.  Marland Kitchen oxyCODONE 10 MG TABS Take 1 tablet (10 mg total) by mouth 2 (two) times daily as needed for severe pain.  Marland Kitchen prochlorperazine (COMPAZINE) 10 MG tablet Take 1 tablet (10 mg total) by mouth every 6 (six) hours as needed for nausea or vomiting.  . regorafenib (STIVARGA) 40 MG tablet Start taking 2 tablets daily for the first week, increase it to 3 tablets daily during second and third weeks.  Take 3 weeks on 1 week off.  Take with low-fat meal.  . valsartan-hydrochlorothiazide (DIOVAN HCT) 160-12.5 MG tablet Take 1 tablet by mouth daily.   Facility-Administered Encounter Medications as of 11/18/2018  Medication  . sodium chloride flush (NS) 0.9 % injection 10 mL    ALLERGIES:  No Known Allergies   PHYSICAL EXAM:  ECOG Performance status: 1  Vitals:   11/18/18 1520  BP:  104/78  Pulse: (!) 106  Resp: 18  Temp: 97.7 F (36.5 C)  SpO2: 95%   Filed Weights   11/18/18 1520  Weight: 191 lb 9.6 oz (86.9 kg)    Physical Exam Vitals signs reviewed.  Constitutional:      Appearance: Normal appearance.  Cardiovascular:     Rate and Rhythm: Normal rate and regular rhythm.     Heart sounds: Normal heart sounds.  Pulmonary:     Effort: Pulmonary effort is normal.     Breath sounds: Normal breath sounds.  Abdominal:     General: There is no distension.     Palpations: Abdomen is soft. There is no mass.  Musculoskeletal:        General: No swelling.  Skin:    General: Skin is warm.  Neurological:     General: No focal deficit present.     Mental Status: He is alert and oriented to person, place, and time.  Psychiatric:        Mood and Affect: Mood normal.        Behavior: Behavior normal.      LABORATORY DATA:  I have reviewed the labs as listed.  CBC    Component Value Date/Time   WBC 9.8 11/18/2018 1451   RBC 4.28 11/18/2018 1451   HGB 12.8 (L) 11/18/2018 1451   HCT 39.1 11/18/2018 1451   PLT 215 11/18/2018 1451   MCV 91.4 11/18/2018 1451   MCH 29.9 11/18/2018 1451   MCHC 32.7 11/18/2018 1451   RDW 14.6 11/18/2018 1451   LYMPHSABS 1.6 11/18/2018 1451   MONOABS 1.0 11/18/2018 1451   EOSABS 0.3 11/18/2018 1451   BASOSABS 0.1 11/18/2018 1451   CMP Latest Ref Rng & Units 11/18/2018 10/25/2018  10/24/2018  Glucose 70 - 99 mg/dL 196(H) 93 192(H)  BUN 8 - 23 mg/dL '19 14 18  ' Creatinine 0.61 - 1.24 mg/dL 0.99 0.91 1.27(H)  Sodium 135 - 145 mmol/L 132(L) 138 132(L)  Potassium 3.5 - 5.1 mmol/L 4.1 4.0 3.9  Chloride 98 - 111 mmol/L 95(L) 105 97(L)  CO2 22 - 32 mmol/L '25 26 22  ' Calcium 8.9 - 10.3 mg/dL 10.0 8.6(L) 9.0  Total Protein 6.5 - 8.1 g/dL 8.2(H) 6.5 7.2  Total Bilirubin 0.3 - 1.2 mg/dL 0.8 0.6 0.9  Alkaline Phos 38 - 126 U/L 281(H) 139(H) 166(H)  AST 15 - 41 U/L 34 21 27  ALT 0 - 44 U/L '24 16 18       ' DIAGNOSTIC IMAGING:  I  have independently reviewed the scans and discussed with the patient.   I have reviewed Venita Lick LPN's note and agree with the documentation.  I personally performed a face-to-face visit, made revisions and my assessment and plan is as follows.    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 -7 cycles of FOLFOX from 07/13/2018 through 10/13/2018 with progression. - CT CAP on 10/21/2018 shows new and enlarging bilateral pulmonary nodules, mixed response in the liver. - Admitted from 10/24/2018 through 10/26/2018 with E. coli UTI. - Came to the ER on 11/09/2018 with severe posterior neck pain.  CT of the head was negative.  CT C-spine showed mild erosive changes in anterior aspect of the C2. - MRI of the brain on 11/12/2018 was negative. - He is continuing to have severe pain in the back of the neck.  He is taking oxycodone 10 mg half tablet every 4 hours as needed. -Wife reports that his temperature is going up to 99.5 in the evenings.  He denies any chills.  No urinary tract symptoms.  He is lying in the bed for most part of the day due to pain.  Denies any GI symptoms. -I have recommended doing an MRI of the C-spine with and without contrast to further evaluate the C2 lesion to differentiate from osteomyelitis versus metastatic disease.  Labs show calcium of 10 which is slightly higher than his baseline. - I have also sent a prescription for Stivarga at prior visit for his metastatic disease.  We will follow-up on it. -We will see him back after MRI.  Total time spent is 25 minutes more than with the time spent face-to-face discussing scan results, further plan, counseling and coordination of care.  Orders placed this encounter:  Orders Placed This Encounter  Procedures  . MR Cervical Spine [place], MD  [place] [phone]

## 2018-11-18 NOTE — Patient Instructions (Addendum)
[place] at [facility] Discharge Instructions  You were seen today by Dr. Delton Coombes. He went over how you've been feeling lately. He will see you back after scan for follow up.   Thank you for choosing [place] at [facility] to provide your oncology and hematology care.  To afford each patient quality time with our provider, please arrive at least 15 minutes before your scheduled appointment time.   If you have a lab appointment with the [facility] please come in thru the  Main Entrance and check in at the main information desk  You need to re-schedule your appointment should you arrive 10 or more minutes late.  We strive to give you quality time with our providers, and arriving late affects you and other patients whose appointments are after yours.  Also, if you no show three or more times for appointments you may be dismissed from the clinic at the providers discretion.     Again, thank you for choosing [facility].  Our hope is that these requests will decrease the amount of time that you wait before being seen by our physicians.       _____________________________________________________________  Should you have questions after your visit to [facility], please contact our office at [phone] between the hours of 8:00 a.m. and 4:30 p.m.  Voicemails left after 4:00 p.m. will not be returned until the following business day.  For prescription refill requests, have your pharmacy contact our office and allow 72 hours.    Cancer Center Support Programs:   > Cancer Support Group  2nd Tuesday of the month 1pm-2pm, Journey Room

## 2018-11-19 ENCOUNTER — Encounter (HOSPITAL_COMMUNITY): Payer: Self-pay | Admitting: Hematology

## 2018-11-19 ENCOUNTER — Inpatient Hospital Stay (HOSPITAL_BASED_OUTPATIENT_CLINIC_OR_DEPARTMENT_OTHER): Payer: Medicare PPO | Admitting: Hematology

## 2018-11-19 ENCOUNTER — Other Ambulatory Visit (HOSPITAL_COMMUNITY): Payer: Self-pay | Admitting: Nurse Practitioner

## 2018-11-19 VITALS — BP 110/84 | HR 108 | Temp 98.9°F | Resp 19 | Wt 191.1 lb

## 2018-11-19 DIAGNOSIS — M542 Cervicalgia: Secondary | ICD-10-CM

## 2018-11-19 DIAGNOSIS — C189 Malignant neoplasm of colon, unspecified: Secondary | ICD-10-CM

## 2018-11-19 DIAGNOSIS — Z87891 Personal history of nicotine dependence: Secondary | ICD-10-CM | POA: Diagnosis not present

## 2018-11-19 DIAGNOSIS — E1142 Type 2 diabetes mellitus with diabetic polyneuropathy: Secondary | ICD-10-CM | POA: Diagnosis not present

## 2018-11-19 DIAGNOSIS — R51 Headache: Secondary | ICD-10-CM | POA: Diagnosis not present

## 2018-11-19 DIAGNOSIS — M79652 Pain in left thigh: Secondary | ICD-10-CM | POA: Diagnosis not present

## 2018-11-19 DIAGNOSIS — C787 Secondary malignant neoplasm of liver and intrahepatic bile duct: Secondary | ICD-10-CM

## 2018-11-19 DIAGNOSIS — C7800 Secondary malignant neoplasm of unspecified lung: Secondary | ICD-10-CM

## 2018-11-19 DIAGNOSIS — I10 Essential (primary) hypertension: Secondary | ICD-10-CM | POA: Diagnosis not present

## 2018-11-19 MED ORDER — DEXAMETHASONE 2 MG PO TABS: 2.0000 mg | ORAL_TABLET | Freq: Every day | ORAL | 0 refills | Status: AC

## 2018-11-19 MED ORDER — HYDROCODONE-ACETAMINOPHEN 10-325 MG PO TABS: 1.0000 | ORAL_TABLET | Freq: Four times a day (QID) | ORAL | 0 refills | Status: DC | PRN

## 2018-11-19 NOTE — Assessment & Plan Note (Addendum)
1.  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 -7 cycles of FOLFOX from 07/13/2018 through 10/13/2018 with progression. - CT CAP on 10/21/2018 shows new and enlarging bilateral pulmonary nodules, mixed response in the liver.  New primarily sclerotic lesion eccentric to the left in the T11 vertebral body, favor metastatic lesion. - Admitted from 10/24/2018 through 10/26/2018 with E. coli UTI. - Came to the ER on 11/09/2018 with severe posterior neck pain.  CT of the head was negative.  CT C-spine showed mild erosive changes in anterior aspect of the C2. - MRI of the brain on 11/12/2018 was negative. - We reviewed results of the MRI C-spine with and without contrast from 11/18/2018 which showed tumor infiltrating and destroying the majority of the C2 vertebral body.  Tumor extends into the anterior aspect of the spinal canal at C2 without impingement upon the spinal cord.  Humerus extends into both neural foramina at C2-3, right greater than left.  Tumor compresses the right vertebral artery at the C2 level. - I have recommended radiation therapy to this area as soon as possible.  We will make a referral to Dr. Lisbeth Renshaw.  I have called and talked to Worthy Flank. -I will see him back in 2 weeks for follow-up. -I have recommended a change in therapy for his colon cancer.  We have sent a prescription for Stivarga to specialty pharmacy.  2.  Neck pain: - He is having intractable neck pain.  If he takes 10 mg of oxycodone he gets confused. -I will send hydrocodone 10 mg with Tylenol every 6 hours as needed. -I will also consider adding dexamethasone 2 mg in the mornings.

## 2018-11-19 NOTE — Progress Notes (Signed)
Christopher Friedman, [place] [id number]   CLINIC:  Medical Oncology/Hematology  PCP:  Christopher Bis, MD [place] [id number] [phone]   REASON FOR VISIT:  Follow-up for metastatic colon cancer and neck pain.   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 ([place])  05/28/2017 - 06/09/2018 Chemotherapy   The patient had palonosetron (ALOXI) injection 0.25 mg, 0.25 mg, Intravenous,  Once, 18 of 22 cycles Administration: 0.25 mg (09/03/2017), 0.25 mg (10/01/2017), 0.25 mg (10/15/2017), 0.25 mg (10/29/2017), 0.25 mg (09/17/2017), 0.25 mg (03/02/2018), 0.25 mg (03/16/2018), 0.25 mg (03/30/2018), 0.25 mg (04/13/2018), 0.25 mg (05/13/2018), 0.25 mg (05/27/2018) irinotecan (CAMPTOSAR) 400 mg in dextrose 5 % 500 mL chemo infusion, 180 mg/m2, Intravenous,  Once, 18 of 22 cycles Dose modification: 144 mg/m2 (Cycle 4, Reason: Provider Judgment) Administration: 320 mg (09/03/2017), 320 mg (10/01/2017), 320 mg (10/15/2017), 320 mg (10/29/2017), 320 mg (09/17/2017), 320 mg (03/02/2018), 320 mg (03/16/2018), 320 mg (03/30/2018), 320 mg (04/13/2018), 320 mg (05/13/2018), 320 mg (05/27/2018) leucovorin 888 mg in dextrose 5 % 250 mL infusion, 400 mg/m2, Intravenous,  Once, 18 of 22 cycles Administration: 900 mg (09/03/2017), 900 mg (10/01/2017), 900 mg (10/15/2017), 900 mg (10/29/2017), 900 mg (09/17/2017), 900 mg (03/02/2018), 900 mg (03/16/2018), 900 mg (03/30/2018), 900 mg (04/13/2018), 900 mg (05/13/2018), 900 mg (05/27/2018) fluorouracil (ADRUCIL) chemo injection 900 mg, 400 mg/m2, Intravenous,  Once, 18 of 22 cycles Dose modification: 320 mg/m2 (Cycle 4, Reason: Provider Judgment) Administration: 700 mg (09/03/2017), 700 mg (10/01/2017), 700 mg (10/15/2017), 700 mg (10/29/2017), 700 mg (09/17/2017), 700 mg (03/02/2018), 700 mg (03/16/2018), 700 mg (03/30/2018), 700 mg (04/13/2018), 700 mg (05/13/2018), 700 mg (05/27/2018) fluorouracil (ADRUCIL) 5,350 mg in sodium  chloride 0.9 % 143 mL chemo infusion, 2,400 mg/m2, Intravenous, 1 Day/Dose, 18 of 22 cycles Dose modification: 1,920 mg/m2 (Cycle 4, Reason: Provider Judgment) Administration: 4,250 mg (09/03/2017), 4,250 mg (10/01/2017), 4,250 mg (10/15/2017), 4,250 mg (10/29/2017), 4,250 mg (09/17/2017), 4,250 mg (03/02/2018), 4,250 mg (03/16/2018), 4,250 mg (03/30/2018), 4,250 mg (04/13/2018), 4,250 mg (05/13/2018), 4,250 mg (05/27/2018)  for chemotherapy treatment.    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 ([place])   07/13/2018 -  Chemotherapy   The patient had palonosetron (ALOXI) injection 0.25 mg, 0.25 mg, Intravenous,  Once, 7 of 10 cycles Administration: 0.25 mg (07/13/2018), 0.25 mg (07/27/2018), 0.25 mg (08/10/2018), 0.25 mg (08/25/2018), 0.25 mg (09/09/2018), 0.25 mg (09/28/2018), 0.25 mg (10/13/2018) leucovorin 864 mg in dextrose 5 % 250 mL infusion, 400 mg/m2 = 864 mg, Intravenous,  Once, 7 of 10 cycles Administration: 864 mg (07/13/2018), 864 mg (07/27/2018), 864 mg (08/10/2018), 864 mg (08/25/2018), 864 mg (09/09/2018), 864 mg (09/28/2018), 864 mg (10/13/2018) oxaliplatin (ELOXATIN) 145 mg in dextrose 5 % 500 mL chemo infusion, 68 mg/m2 = 145 mg (80 % of original dose 85 mg/m2), Intravenous,  Once, 7 of 10 cycles Dose modification: 68 mg/m2 (80 % of original dose 85 mg/m2, Cycle 1, Reason: Other (see comments), Comment: Pre-existing neuropathy) Administration: 145 mg (07/13/2018), 145 mg (07/27/2018), 145 mg (08/10/2018), 145 mg (08/25/2018), 145 mg (09/09/2018), 145 mg (09/28/2018), 145 mg (10/13/2018) fluorouracil (ADRUCIL) chemo injection 700 mg, 320 mg/m2 = 700 mg (80 % of original dose 400 mg/m2), Intravenous,  Once, 7 of 10 cycles Dose modification: 320 mg/m2 (80 % of original dose 400 mg/m2, Cycle 1, Reason: Provider Judgment) Administration: 700 mg (07/13/2018), 700 mg (07/27/2018), 700 mg (08/10/2018), 700 mg (08/25/2018), 700  mg (09/09/2018), 700 mg (09/28/2018), 700 mg (10/13/2018) fluorouracil  (ADRUCIL) 4,150 mg in sodium chloride 0.9 % 67 mL chemo infusion, 1,920 mg/m2 = 4,150 mg (80 % of original dose 2,400 mg/m2), Intravenous, 1 Day/Dose, 7 of 10 cycles Dose modification: 1,920 mg/m2 (80 % of original dose 2,400 mg/m2, Cycle 1, Reason: Provider Judgment), 1,920 mg/m2 (original dose 2,400 mg/m2, Cycle 4, Reason: Provider Judgment) Administration: 4,150 mg (07/13/2018), 4,150 mg (08/10/2018), 4,150 mg (07/27/2018), 4,150 mg (08/25/2018), 4,150 mg (09/09/2018), 4,150 mg (09/28/2018), 4,150 mg (10/13/2018)  for chemotherapy treatment.       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 male seen for neck pain from metastatic colon cancer.  He is currently taking oxycodone 10 mg half tablet for the neck pain.  It lasts about 2 to 3 hours.  Then he had to take the second half.  Occasionally gets confused.  Wife is seen with him today.  He also reported pain in the left mid thigh region which is also new.  Denies any weakness in the legs or upper extremities.  Denies any loss of bowel or bladder function.      REVIEW OF SYSTEMS:  Review of Systems  Musculoskeletal: Positive for neck pain.  Psychiatric/Behavioral: Positive for sleep disturbance.  All other systems reviewed and are negative.    PAST MEDICAL/SURGICAL HISTORY:  Past Medical History:  Diagnosis Date  . Colon cancer ([place])   . Diabetes ([place])   . GERD (gastroesophageal reflux disease)   . Hypertension    Past Surgical History:  Procedure Laterality Date  . APPENDECTOMY     during colon surgery  . COLON SURGERY    . IR RADIOLOGIST EVAL & MGMT  11/26/2017  . RADIOFREQUENCY ABLATION N/A 12/10/2017   Procedure: CT MICROWAVE THERMAL ABLATION (LIVER);  Surgeon: Markus Daft, MD;  Location: [facility]S;   Service: Anesthesiology;  Laterality: N/A;  . SHOULDER SURGERY  2003     SOCIAL HISTORY:  Social History   Socioeconomic History  . Marital status: Married    Spouse name: Not on file  . Number of children: Not on file  . Years of education: Not on file  . Highest education level: Not on file  Occupational History  . Not on file  Social Needs  . Financial resource strain: Not on file  . Food insecurity    Worry: Not on file    Inability: Not on file  . Transportation needs    Medical: Not on file    Non-medical: Not on file  Tobacco Use  . Smoking status: Former Smoker    Quit date: 04/05/1995    Years since quitting: 23.6  . Smokeless tobacco: Never Used  Substance and Sexual Activity  . Alcohol use: Not Currently    Alcohol/week: 0.0 standard drinks    Comment: used to drink but stopped about 20 years ago  . Drug use: Never  . Sexual activity: Not on file  Lifestyle  . Physical activity    Days per week: Not on file    Minutes per session: Not on file  . Stress: Not on file  Relationships  . Social Herbalist on phone: Not on file    Gets together: Not on file    Attends  religious service: Not on file    Active member of club or organization: Not on file    Attends meetings of clubs or organizations: Not on file    Relationship status: Not on file  . Intimate partner violence    Fear of current or ex partner: Not on file    Emotionally abused: Not on file    Physically abused: Not on file    Forced sexual activity: Not on file  Other Topics Concern  . Not on file  Social History Narrative  . Not on file    FAMILY HISTORY:  Family History  Problem Relation Age of Onset  . Diabetes Mother   . Cancer Father   . Bone cancer Father   . Colon cancer Neg Hx   . Colon polyps Neg Hx     CURRENT MEDICATIONS:  Outpatient Encounter Medications as of 11/19/2018  Medication Sig  . acetaminophen (TYLENOL) 325 MG tablet Take 2 tablets (650 mg total)  by mouth every 6 (six) hours as needed for mild pain, fever or headache.  . Alpha-Lipoic Acid 200 MG CAPS Take 1 capsule by mouth daily.  . Ascorbic Acid (VITAMIN C) 1000 MG tablet Take 2,000 mg by mouth daily.   . ASHWAGANDHA PO Take 800 mg by mouth daily.  Marland Kitchen aspirin 81 MG tablet Take 1 tablet (81 mg total) by mouth daily with breakfast.  . ASPIRIN ADULT LOW STRENGTH 81 MG EC tablet Take 81 mg by mouth daily with breakfast.  . atorvastatin (LIPITOR) 10 MG tablet Take 10 mg by mouth daily.   Marland Kitchen BEE POLLEN PO Take 1 capsule by mouth daily.   . Blood Glucose Monitoring Suppl (ONE TOUCH ULTRA MINI) W/DEVICE KIT USE TO CHECK BLOOD SUGAR DAILY.  . cholecalciferol (VITAMIN D) 1000 units tablet Take 5,000 Units by mouth daily.   Marland Kitchen CINNAMON PO Take 2,000 mg by mouth daily.   Marland Kitchen Cod Liver Oil CAPS Take 1 capsule by mouth daily.  . Coenzyme Q10 (CO Q 10) 100 MG CAPS Take 1 capsule by mouth daily.   . Cyanocobalamin (VITAMIN B-12) 5000 MCG TBDP Take 5,000 mcg by mouth daily.  . cyclobenzaprine (FLEXERIL) 10 MG tablet TAKE ONE TABLET BY MOUTH EVERY EVENING AT BEDTIME AS NEEDED FOR MUSCLE SPASMS.  Marland Kitchen diphenoxylate-atropine (LOMOTIL) 2.5-0.025 MG tablet Take 1 tablet by mouth every 6 (six) hours as needed for diarrhea or loose stools (stomach cramps).  . Flax Oil-Fish Oil-Borage Oil (FISH OIL-FLAX OIL-BORAGE OIL) CAPS Take 1 capsule by mouth daily.  . Garlic 4665 MG CAPS Take 2 capsules by mouth daily.   Marland Kitchen glipiZIDE (GLUCOTROL XL) 10 MG 24 hr tablet Take 10 mg by mouth daily.   . Glucosamine-Chondroit-Collagen ([employer] GLUCO-CHONDROIT PLUS UC-II PO) Take 1 tablet by mouth daily.   Marland Kitchen LECITHIN PO Take 1 capsule by mouth daily. 1200 mg  . lidocaine-prilocaine (EMLA) cream Apply small amount over port site and cover with plastic wrap one hour prior to appointment.  . magnesium oxide (MAG-OX) 400 MG tablet Take 400 mg by mouth daily.   . metFORMIN (GLUCOPHAGE) 500 MG tablet Take 1 tablet (500 mg total) by mouth 2 (two)  times daily with a meal. (Patient taking differently: Take 1,000 mg by mouth 2 (two) times daily with a meal. )  . milk thistle 175 MG tablet Take 175 mg by mouth daily.  . ondansetron (ZOFRAN) 4 MG tablet Take 1 tablet (4 mg total) by mouth every 6 (six) hours as needed  for nausea.  Marland Kitchen OVER THE COUNTER MEDICATION Take 1 capsule by mouth daily. Mushroom Complex 750 mg  . OVER THE COUNTER MEDICATION Take 1 capsule by mouth daily. Cura Med 750 mg  . oxyCODONE (OXYCONTIN) 10 mg 12 hr tablet Take 1 tablet (10 mg total) by mouth every 12 (twelve) hours.  Marland Kitchen oxyCODONE 10 MG TABS Take 1 tablet (10 mg total) by mouth 2 (two) times daily as needed for severe pain.  Marland Kitchen prochlorperazine (COMPAZINE) 10 MG tablet Take 1 tablet (10 mg total) by mouth every 6 (six) hours as needed for nausea or vomiting.  . regorafenib (STIVARGA) 40 MG tablet Start taking 2 tablets daily for the first week, increase it to 3 tablets daily during second and third weeks.  Take 3 weeks on 1 week off.  Take with low-fat meal.  . valsartan-hydrochlorothiazide (DIOVAN HCT) 160-12.5 MG tablet Take 1 tablet by mouth daily.  . [DISCONTINUED] HYDROcodone-acetaminophen (NORCO/VICODIN) 5-325 MG tablet 1 or 2 tabs PO q12 hours prn pain  . dexamethasone (DECADRON) 2 MG tablet Take 1 tablet (2 mg total) by mouth daily.  Marland Kitchen HYDROcodone-acetaminophen (NORCO) 10-325 MG tablet Take 1 tablet by mouth every 6 (six) hours as needed.   Facility-Administered Encounter Medications as of 11/19/2018  Medication  . sodium chloride flush (NS) 0.9 % injection 10 mL    ALLERGIES:  No Known Allergies   PHYSICAL EXAM:  ECOG Performance status: 1  Vitals:   11/19/18 1205  BP: 110/84  Pulse: (!) 108  Resp: 19  Temp: 98.9 F (37.2 C)  SpO2: 95%   Filed Weights   11/19/18 1205  Weight: 191 lb 1.6 oz (86.7 kg)    Physical Exam Vitals signs reviewed.  Constitutional:      Appearance: Normal appearance.  Cardiovascular:     Rate and Rhythm: Normal  rate and regular rhythm.     Heart sounds: Normal heart sounds.  Pulmonary:     Effort: Pulmonary effort is normal.     Breath sounds: Normal breath sounds.  Abdominal:     General: There is no distension.     Palpations: Abdomen is soft. There is no mass.  Musculoskeletal:        General: No swelling.  Skin:    General: Skin is warm.  Neurological:     General: No focal deficit present.     Mental Status: He is alert and oriented to person, place, and time.  Psychiatric:        Mood and Affect: Mood normal.        Behavior: Behavior normal.      LABORATORY DATA:  I have reviewed the labs as listed.  CBC    Component Value Date/Time   WBC 9.8 11/18/2018 1451   RBC 4.28 11/18/2018 1451   HGB 12.8 (L) 11/18/2018 1451   HCT 39.1 11/18/2018 1451   PLT 215 11/18/2018 1451   MCV 91.4 11/18/2018 1451   MCH 29.9 11/18/2018 1451   MCHC 32.7 11/18/2018 1451   RDW 14.6 11/18/2018 1451   LYMPHSABS 1.6 11/18/2018 1451   MONOABS 1.0 11/18/2018 1451   EOSABS 0.3 11/18/2018 1451   BASOSABS 0.1 11/18/2018 1451   CMP Latest Ref Rng & Units 11/18/2018 10/25/2018 10/24/2018  Glucose 70 - 99 mg/dL 196(H) 93 192(H)  BUN 8 - 23 mg/dL '19 14 18  ' Creatinine 0.61 - 1.24 mg/dL 0.99 0.91 1.27(H)  Sodium 135 - 145 mmol/L 132(L) 138 132(L)  Potassium 3.5 - 5.1 mmol/L 4.1  4.0 3.9  Chloride 98 - 111 mmol/L 95(L) 105 97(L)  CO2 22 - 32 mmol/L '25 26 22  ' Calcium 8.9 - 10.3 mg/dL 10.0 8.6(L) 9.0  Total Protein 6.5 - 8.1 g/dL 8.2(H) 6.5 7.2  Total Bilirubin 0.3 - 1.2 mg/dL 0.8 0.6 0.9  Alkaline Phos 38 - 126 U/L 281(H) 139(H) 166(H)  AST 15 - 41 U/L 34 21 27  ALT 0 - 44 U/L '24 16 18       ' DIAGNOSTIC IMAGING:  I have independently reviewed the scans and discussed with the patient.   I have reviewed Venita Lick LPN's note and agree with the documentation.  I personally performed a face-to-face visit, made revisions and my assessment and plan is as follows.    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 -7 cycles of FOLFOX from 07/13/2018 through 10/13/2018 with progression. - CT CAP on 10/21/2018 shows new and enlarging bilateral pulmonary nodules, mixed response in the liver.  New primarily sclerotic lesion eccentric to the left in the T11 vertebral body, favor metastatic lesion. - Admitted from 10/24/2018 through 10/26/2018 with E. coli UTI. - Came to the ER on 11/09/2018 with severe posterior neck pain.  CT of the head was negative.  CT C-spine showed mild erosive changes in anterior aspect of the C2. - MRI of the brain on 11/12/2018 was negative. - We reviewed results of the MRI C-spine with and without contrast from 11/18/2018 which showed tumor infiltrating and destroying the majority of the C2 vertebral body.  Tumor extends into the anterior aspect of the spinal canal at C2 without impingement upon the spinal cord.  Humerus extends into both neural foramina at C2-3, right greater than left.  Tumor compresses the right vertebral artery at the C2 level. - I have recommended radiation therapy to this area as soon as possible.  We will make a referral to Dr. Lisbeth Renshaw.  I have called and talked to Worthy Flank. -I will see him back in 2 weeks for follow-up. -I have recommended a change in therapy for his colon cancer.  We have sent a prescription for Stivarga to specialty pharmacy.  2.  Neck pain: - He is having intractable neck pain.  If he takes 10 mg of oxycodone he gets confused. -I will send hydrocodone 10 mg with Tylenol every 6 hours as needed. -I will also consider adding dexamethasone 2 mg in the mornings.  Total time spent is 25 minutes more than with the time spent face-to-face discussing scan results, further plan, counseling and coordination of care.  Orders placed this encounter:  No orders of the defined types  were placed in this encounter.     Christopher Jack, MD [place] [phone]

## 2018-11-19 NOTE — Patient Instructions (Addendum)
[place] at [facility] Discharge Instructions  You were seen today by Dr. Delton Coombes. He went over your recent scan results. He will refer you to radiation for your neck. He will see you back in 2 weeks for labs and follow up.   Thank you for choosing [place] at [facility] to provide your oncology and hematology care.  To afford each patient quality time with our provider, please arrive at least 15 minutes before your scheduled appointment time.   If you have a lab appointment with the [facility] please come in thru the  Main Entrance and check in at the main information desk  You need to re-schedule your appointment should you arrive 10 or more minutes late.  We strive to give you quality time with our providers, and arriving late affects you and other patients whose appointments are after yours.  Also, if you no show three or more times for appointments you may be dismissed from the clinic at the providers discretion.     Again, thank you for choosing [facility].  Our hope is that these requests will decrease the amount of time that you wait before being seen by our physicians.       _____________________________________________________________  Should you have questions after your visit to [facility], please contact our office at [phone] between the hours of 8:00 a.m. and 4:30 p.m.  Voicemails left after 4:00 p.m. will not be returned until the following business day.  For prescription refill requests, have your pharmacy contact our office and allow 72 hours.    Cancer Center Support Programs:   > Cancer Support Group  2nd Tuesday of the month 1pm-2pm, Journey Room

## 2018-11-23 ENCOUNTER — Other Ambulatory Visit (HOSPITAL_COMMUNITY): Payer: Self-pay | Admitting: Nurse Practitioner

## 2018-11-23 ENCOUNTER — Telehealth: Payer: Self-pay | Admitting: Radiation Oncology

## 2018-11-23 DIAGNOSIS — R509 Fever, unspecified: Secondary | ICD-10-CM | POA: Diagnosis not present

## 2018-11-23 DIAGNOSIS — Z6825 Body mass index (BMI) 25.0-25.9, adult: Secondary | ICD-10-CM | POA: Diagnosis not present

## 2018-11-23 DIAGNOSIS — R300 Dysuria: Secondary | ICD-10-CM | POA: Diagnosis not present

## 2018-11-23 NOTE — Telephone Encounter (Signed)
New Message:    LVM for patient to return call to schedule from referral received.

## 2018-11-25 ENCOUNTER — Telehealth: Payer: Self-pay | Admitting: Radiation Oncology

## 2018-11-25 NOTE — Telephone Encounter (Signed)
New Message: ° ° °LVM for patient to return call to schedule appt from referral received. °

## 2018-11-26 DIAGNOSIS — A4159 Other Gram-negative sepsis: Secondary | ICD-10-CM | POA: Diagnosis not present

## 2018-11-27 DIAGNOSIS — I10 Essential (primary) hypertension: Secondary | ICD-10-CM | POA: Diagnosis not present

## 2018-11-27 DIAGNOSIS — E785 Hyperlipidemia, unspecified: Secondary | ICD-10-CM | POA: Diagnosis not present

## 2018-11-30 ENCOUNTER — Telehealth: Payer: Self-pay | Admitting: Radiation Oncology

## 2018-11-30 NOTE — Telephone Encounter (Signed)
New message: ° ° °LVM for patient to return call to schedule appt from referral received. °

## 2018-12-01 ENCOUNTER — Other Ambulatory Visit (HOSPITAL_COMMUNITY): Payer: Self-pay | Admitting: *Deleted

## 2018-12-01 DIAGNOSIS — M542 Cervicalgia: Secondary | ICD-10-CM

## 2018-12-01 MED ORDER — HYDROCODONE-ACETAMINOPHEN 10-325 MG PO TABS: 1.0000 | ORAL_TABLET | Freq: Four times a day (QID) | ORAL | 0 refills | Status: AC | PRN

## 2018-12-02 NOTE — Progress Notes (Signed)
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  Tumor extends into the anterior aspect of the spinal canal at C2 without impingement upon the spinal cord.  Humerus extends into both neural foramina at C2-3, right greater than left.  Tumor compresses the right vertebral artery at the C2 level.  CT Head 11/09/2018: (severe posterior neck pain) negative.  CT C-spine: showed mild erosive changes in anterior aspect of the C2.  MRI brain 11/12/2018: negative.  Biopsies of   Past/Anticipated interventions by surgeon, if any:   Past/Anticipated interventions by medical oncology, if any:  Dr. Delton Coombes 11/19/2018 - FOLFIRI until 05/27/2018 with progression. -7 cycles of FOLFOX from 07/13/2018 through 10/13/2018 with progression. - CT CAP on 10/21/2018 shows new and enlarging bilateral pulmonary nodules, mixed response in the liver.  New primarily sclerotic lesion eccentric to the left in the T11 vertebral body, favor metastatic lesion. - I have recommended radiation therapy to the C2 area as soon as possible.  We will make a referral to Dr. Lisbeth Renshaw.  I have called and talked to Worthy Flank. -I will see him back in 2 weeks for follow-up. -I have recommended a change in therapy for his colon cancer.  We have sent a prescription for Stivarga to specialty pharmacy.   Weight changes, if any: Lost about 25 pounds over the last 2 months  Bowel/Bladder complaints, if any: Some constipation from pain medication, taking stool softeners.  Nausea / Vomiting, if any: No  Pain issues, if any: Splitting headache and neck pain constant- taking hydrocodone q5-6 hr  Any blood per rectum:     SAFETY ISSUES:  Prior radiation? No  Pacemaker/ICD? No  Possible current pregnancy? n/a  Is the patient on methotrexate?   Current  Complaints/Details: -microwave ablation 2019

## 2018-12-03 ENCOUNTER — Ambulatory Visit (HOSPITAL_COMMUNITY): Payer: Medicare PPO | Admitting: Hematology

## 2018-12-03 ENCOUNTER — Encounter: Payer: Self-pay | Admitting: Radiation Oncology

## 2018-12-03 ENCOUNTER — Other Ambulatory Visit: Payer: Self-pay

## 2018-12-03 ENCOUNTER — Other Ambulatory Visit (HOSPITAL_COMMUNITY): Payer: Medicare PPO

## 2018-12-03 ENCOUNTER — Ambulatory Visit
Admission: RE | Admit: 2018-12-03 | Discharge: 2018-12-03 | Disposition: A | Discharge status: Home / Self Care | Payer: Medicare PPO | Source: Ambulatory Visit | Attending: Radiation Oncology | Admitting: Radiation Oncology

## 2018-12-03 VITALS — Ht 71.0 in | Wt 184.0 lb

## 2018-12-03 DIAGNOSIS — C189 Malignant neoplasm of colon, unspecified: Secondary | ICD-10-CM

## 2018-12-03 DIAGNOSIS — C7951 Secondary malignant neoplasm of bone: Secondary | ICD-10-CM

## 2018-12-03 DIAGNOSIS — C787 Secondary malignant neoplasm of liver and intrahepatic bile duct: Secondary | ICD-10-CM | POA: Diagnosis not present

## 2018-12-03 DIAGNOSIS — R51 Headache: Secondary | ICD-10-CM | POA: Diagnosis not present

## 2018-12-03 DIAGNOSIS — Z808 Family history of malignant neoplasm of other organs or systems: Secondary | ICD-10-CM | POA: Diagnosis not present

## 2018-12-03 NOTE — Progress Notes (Addendum)
Radiation Oncology         ([phone]) [phone] ________________________________  Initial Outpatient Consultation - Conducted via telephone due to current COVID-19 concerns for limiting patient exposure  I spoke with the patient to conduct this consult visit via telephone to spare the patient unnecessary potential exposure in the healthcare setting during the current COVID-19 pandemic. The patient was notified in advance and was offered a Wise meeting to allow for face to face communication but unfortunately reported that they did not have the appropriate resources/technology to support such a visit and instead preferred to proceed with a telephone consult.   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 A diagnosis of Metastatic colon cancer to liver [facility]) was also pertinent to this visit.   HISTORY OF PRESENT ILLNESS: Christopher Friedman is a 68 y.o. male seen at the request of Dr. Delton Coombes for a history of metastatic colon cancer with a new lesion in the C2 vertebral body.  The patient has a longstanding history of his colon cancer diagnosis, with surgery in 2017, FOLFIRI had been given until late January 2020 when he was found to have progression.  A liver, biopsy on 06/23/2018 confirmed his disease, he has been on FOLFOX between 07/13/2018 and 10/13/2018.  He was found to have progression of disease with new and enlarging bilateral pulmonary nodules, and a mixed response in the liver.  He had been hospitalized for an E. coli UTI, and in July began noticing progressive headaches and neck pain.  A CT scan of the head was negative but cervical CT imaging of the spine revealed mild erosive changes anterior along C2.  MRI of the brain on 716 was negative.  Dr.  Delton Coombes head reviewed the case with [place] by phone a few weeks prior, and we recommended an MRI of the cervical spine.  This was performed on On 11/18/2018 revealing tumor infiltration in this direction of the majority of the C2 vertebral body extending into the anterior aspect of the spinal canal without impingement upon the cervical spinal cord extending to both neural foramen a from C2-3 right greater than left involving the right vertebral artery.  He was started on dexamethasone at 2 mg, and is contacted by phone to discuss options of radiotherapy.  There was some communication breakdown and getting him in for his appointment until today and the question was whether he would be a candidate for stereotactic radiosurgery.  Dr. Lisbeth Renshaw has evaluated the imaging from his MRI scan and does not feel that the patient is well suited for stereotactic radiosurgery due to the proximity to the cord, and persistent and progressing systemic disease.  Rather he would be a candidate for palliative radiotherapy.    PREVIOUS RADIATION THERAPY: No   PAST MEDICAL HISTORY:  Past Medical History:  Diagnosis Date   Colon cancer ([place])    Diabetes ([place])    GERD (gastroesophageal reflux disease)    Hypertension        PAST SURGICAL HISTORY: Past Surgical History:  Procedure Laterality Date   APPENDECTOMY     during colon surgery   COLON SURGERY     IR RADIOLOGIST EVAL & MGMT  11/26/2017   RADIOFREQUENCY ABLATION N/A 12/10/2017   Procedure: CT MICROWAVE THERMAL ABLATION (LIVER);  Surgeon: Markus Daft, MD;  Location: [facility];  Service: Anesthesiology;  Laterality: N/A;   SHOULDER SURGERY  2003     FAMILY HISTORY:  Family History  Problem Relation Age of Onset   Diabetes Mother    Cancer Father    Bone cancer Father    Leukemia Brother    Colon cancer Neg Hx    Colon polyps Neg Hx      SOCIAL HISTORY:  reports that he quit smoking about 23 years ago. He has never used smokeless tobacco. He  reports previous alcohol use. He reports that he does not use drugs.   ALLERGIES: Patient has no known allergies.   MEDICATIONS:  Current Outpatient Medications  Medication Sig Dispense Refill   acetaminophen (TYLENOL) 325 MG tablet Take 2 tablets (650 mg total) by mouth every 6 (six) hours as needed for mild pain, fever or headache. 12 tablet 1   Alpha-Lipoic Acid 200 MG CAPS Take 1 capsule by mouth daily.     Ascorbic Acid (VITAMIN C) 1000 MG tablet Take 2,000 mg by mouth daily.      ASHWAGANDHA PO Take 800 mg by mouth daily.     aspirin 81 MG tablet Take 1 tablet (81 mg total) by mouth daily with breakfast. 30 tablet 5   ASPIRIN ADULT LOW STRENGTH 81 MG EC tablet Take 81 mg by mouth daily with breakfast.     atorvastatin (LIPITOR) 10 MG tablet Take 10 mg by mouth daily.      BEE POLLEN PO Take 1 capsule by mouth daily.      Blood Glucose Monitoring Suppl (ONE TOUCH ULTRA MINI) W/DEVICE KIT USE TO CHECK BLOOD SUGAR DAILY.  0   cholecalciferol (VITAMIN D) 1000 units tablet Take 5,000 Units by mouth daily.      CINNAMON PO Take 2,000 mg by mouth daily.      Cod Liver Oil CAPS Take 1 capsule by mouth daily.     Coenzyme Q10 (CO Q 10) 100 MG CAPS Take 1 capsule by mouth daily.      Cyanocobalamin (VITAMIN B-12) 5000 MCG TBDP Take 5,000 mcg by mouth daily.     cyclobenzaprine (FLEXERIL) 10 MG tablet TAKE ONE TABLET BY MOUTH EVERY EVENING AT BEDTIME AS NEEDED FOR MUSCLE SPASMS.     dexamethasone (DECADRON) 2 MG tablet Take 1 tablet (2 mg total) by mouth daily. 15 tablet 0   diphenoxylate-atropine (LOMOTIL) 2.5-0.025 MG tablet Take 1 tablet by mouth every 6 (six) hours as needed for diarrhea or loose stools (stomach cramps). 60 tablet 0   Flax Oil-Fish Oil-Borage Oil (FISH OIL-FLAX OIL-BORAGE OIL) CAPS Take 1 capsule by mouth daily.     Garlic 5621 MG CAPS Take 2 capsules by mouth daily.      glipiZIDE (GLUCOTROL XL) 10 MG 24 hr tablet Take 10 mg by mouth daily.       Glucosamine-Chondroit-Collagen ([employer] GLUCO-CHONDROIT PLUS UC-II PO) Take 1 tablet by mouth daily.      HYDROcodone-acetaminophen (NORCO) 10-325 MG tablet Take 1 tablet by mouth every 6 (six) hours as needed. 30 tablet 0   LECITHIN PO Take 1 capsule by mouth daily. 1200 mg     lidocaine-prilocaine (EMLA) cream Apply small amount over port site and cover with plastic wrap one hour prior to appointment. 30 g 2   magnesium oxide (MAG-OX) 400 MG tablet Take 400 mg by mouth daily.      metFORMIN (GLUCOPHAGE) 500 MG tablet Take 1 tablet (500 mg total)  by mouth 2 (two) times daily with a meal. (Patient taking differently: Take 1,000 mg by mouth 2 (two) times daily with a meal. ) 180 tablet 4   milk thistle 175 MG tablet Take 175 mg by mouth daily.     ondansetron (ZOFRAN) 4 MG tablet Take 1 tablet (4 mg total) by mouth every 6 (six) hours as needed for nausea. 20 tablet 0   OVER THE COUNTER MEDICATION Take 1 capsule by mouth daily. Mushroom Complex 750 mg     OVER THE COUNTER MEDICATION Take 1 capsule by mouth daily. Cura Med 750 mg     oxyCODONE (OXYCONTIN) 10 mg 12 hr tablet Take 1 tablet (10 mg total) by mouth every 12 (twelve) hours. 14 tablet 0   oxyCODONE 10 MG TABS Take 1 tablet (10 mg total) by mouth 2 (two) times daily as needed for severe pain. 60 tablet 0   prochlorperazine (COMPAZINE) 10 MG tablet Take 1 tablet (10 mg total) by mouth every 6 (six) hours as needed for nausea or vomiting. 30 tablet 2   regorafenib (STIVARGA) 40 MG tablet Start taking 2 tablets daily for the first week, increase it to 3 tablets daily during second and third weeks.  Take 3 weeks on 1 week off.  Take with low-fat meal. 56 tablet 0   valsartan-hydrochlorothiazide (DIOVAN HCT) 160-12.5 MG tablet Take 1 tablet by mouth daily. 30 tablet 11   No current facility-administered medications for this encounter.    Facility-Administered Medications Ordered in Other Encounters  Medication Dose Route Frequency  Provider Last Rate Last Dose   sodium chloride flush (NS) 0.9 % injection 10 mL  10 mL Intravenous PRN Derek Jack, MD         REVIEW OF SYSTEMS: On review of systems, the patient was in so much pain that his wife gave the majority of the history, he decided to lay down in the middle of our conversation due to his headache.  Apparently has had improvement with Norco, but has not been unable to keep his pain controlled for more than 4 hours.  He is a new prescription for oxycodone and OxyContin however he has done poorly with these medications as they cause significant drowsiness and confusion.  He has not had any loss of consciousness, loss of visual acuity, speech changes or seizure activity.  No other complaints are verbalized.      PHYSICAL EXAM:  Wt Readings from Last 3 Encounters:  12/03/18 184 lb (83.5 kg)  11/19/18 191 lb 1.6 oz (86.7 kg)  11/18/18 191 lb 9.6 oz (86.9 kg)  Unable to assess due to the nature of the encounter.  ECOG = 3  0 - Asymptomatic (Fully active, able to carry on all predisease activities without restriction)  1 - Symptomatic but completely ambulatory (Restricted in physically strenuous activity but ambulatory and able to carry out work of a light or sedentary nature. For example, light housework, office work)  2 - Symptomatic, <50% in bed during the day (Ambulatory and capable of all self care but unable to carry out any work activities. Up and about more than 50% of waking hours)  3 - Symptomatic, >50% in bed, but not bedbound (Capable of only limited self-care, confined to bed or chair 50% or more of waking hours)  4 - Bedbound (Completely disabled. Cannot carry on any self-care. Totally confined to bed or chair)  5 - Death   Eustace Pen MM, Creech RH, Tormey DC, et al. [phone]). "Toxicity and  response criteria of the [facility]". [place] Oncol. 5 (6): 649-55    LABORATORY DATA:  Lab Results  Component Value Date   WBC  9.8 11/18/2018   HGB 12.8 (L) 11/18/2018   HCT 39.1 11/18/2018   MCV 91.4 11/18/2018   PLT 215 11/18/2018   Lab Results  Component Value Date   NA 132 (L) 11/18/2018   K 4.1 11/18/2018   CL 95 (L) 11/18/2018   CO2 25 11/18/2018   Lab Results  Component Value Date   ALT 24 11/18/2018   AST 34 11/18/2018   ALKPHOS 281 (H) 11/18/2018   BILITOT 0.8 11/18/2018      RADIOGRAPHY: Ct Head Wo Contrast  Result Date: 11/09/2018 CLINICAL DATA:  Chronic headaches for 1 month with neck pain, history of metastatic colon carcinoma, initial encounter EXAM: CT HEAD WITHOUT CONTRAST CT CERVICAL SPINE WITHOUT CONTRAST TECHNIQUE: Multidetector CT imaging of the head and cervical spine was performed following the standard protocol without intravenous contrast. Multiplanar CT image reconstructions of the cervical spine were also generated. COMPARISON:  None. FINDINGS: CT HEAD FINDINGS Brain: No evidence of acute infarction, hemorrhage, hydrocephalus, extra-axial collection or mass lesion/mass effect. Vascular: No hyperdense vessel or unexpected calcification. Skull: Normal. Negative for fracture or focal lesion. Sinuses/Orbits: No acute finding. Other: None. CT CERVICAL SPINE FINDINGS Alignment: Within normal limits. Skull base and vertebrae: 7 cervical segments are well visualized. Vertebral body height is well maintained. Osteophytic changes are noted at C5-6 and C6-7 with disc space narrowing at C6-7 identified. No acute fracture or acute facet abnormality is noted. Lucency is noted within the anterior aspect of the C2 vertebral body eccentric to the right with some cortical erosion identified. These changes are suspicious for metastatic disease. Soft tissues and spinal canal: Surrounding soft tissue structures are within normal limits. No lymphadenopathy is seen. Right chest wall port is noted. Upper chest: Multiple pulmonary nodules are noted consistent with metastatic disease. Other: None IMPRESSION: CT of  the head: No acute intracranial abnormality is noted. CT of the cervical spine: Degenerative changes are identified. No acute fracture is seen. Mild erosive changes in the anterior aspect of C2 suspicious for metastatic disease. Nonemergent MRI may be helpful for further evaluation. Multiple metastatic lesions throughout the lung apices. Electronically Signed   By: Inez Catalina M.D.   On: 11/09/2018 14:51   Ct Cervical Spine Wo Contrast  Result Date: 11/09/2018 CLINICAL DATA:  Chronic headaches for 1 month with neck pain, history of metastatic colon carcinoma, initial encounter EXAM: CT HEAD WITHOUT CONTRAST CT CERVICAL SPINE WITHOUT CONTRAST TECHNIQUE: Multidetector CT imaging of the head and cervical spine was performed following the standard protocol without intravenous contrast. Multiplanar CT image reconstructions of the cervical spine were also generated. COMPARISON:  None. FINDINGS: CT HEAD FINDINGS Brain: No evidence of acute infarction, hemorrhage, hydrocephalus, extra-axial collection or mass lesion/mass effect. Vascular: No hyperdense vessel or unexpected calcification. Skull: Normal. Negative for fracture or focal lesion. Sinuses/Orbits: No acute finding. Other: None. CT CERVICAL SPINE FINDINGS Alignment: Within normal limits. Skull base and vertebrae: 7 cervical segments are well visualized. Vertebral body height is well maintained. Osteophytic changes are noted at C5-6 and C6-7 with disc space narrowing at C6-7 identified. No acute fracture or acute facet abnormality is noted. Lucency is noted within the anterior aspect of the C2 vertebral body eccentric to the right with some cortical erosion identified. These changes are suspicious for metastatic disease. Soft tissues and spinal canal: Surrounding  soft tissue structures are within normal limits. No lymphadenopathy is seen. Right chest wall port is noted. Upper chest: Multiple pulmonary nodules are noted consistent with metastatic disease. Other:  None IMPRESSION: CT of the head: No acute intracranial abnormality is noted. CT of the cervical spine: Degenerative changes are identified. No acute fracture is seen. Mild erosive changes in the anterior aspect of C2 suspicious for metastatic disease. Nonemergent MRI may be helpful for further evaluation. Multiple metastatic lesions throughout the lung apices. Electronically Signed   By: Inez Catalina M.D.   On: 11/09/2018 14:51   Mr Jeri Cos FA Contrast  Result Date: 11/12/2018 CLINICAL DATA:  Headache and left-sided numbness since 2016. EXAM: MRI HEAD WITHOUT AND WITH CONTRAST TECHNIQUE: Multiplanar, multiecho pulse sequences of the brain and surrounding structures were obtained without and with intravenous contrast. CONTRAST:  7.5 cc Gadavist. COMPARISON:  11/09/2018 FINDINGS: Brain: Diffusion imaging does not show any acute or subacute infarction. The brainstem and cerebellum are normal. Cerebral hemispheres show mild small vessel change of the hemispheric white matter. No cortical or large vessel territory infarction. No mass lesion, hemorrhage hydrocephalus or extra-axial collection. After contrast administration, no abnormal enhancement occurs. Vascular: Major vessels at the base of the brain show flow. Skull and upper cervical spine: Negative Sinuses/Orbits: Clear/normal Other: None IMPRESSION: No acute or significant finding. Mild age related small vessel change of the hemispheric deep white matter, fairly typical for age. No cause of the presenting symptoms is identified. Electronically Signed   By: Nelson Chimes M.D.   On: 11/12/2018 11:48   Mr Cervical Spine W Wo Contrast  Result Date: 11/18/2018 CLINICAL DATA:  Severe head and neck pain. Unsteady gait. Destructive lesion of C2. Metastatic colon cancer. EXAM: MRI CERVICAL SPINE WITHOUT AND WITH CONTRAST TECHNIQUE: Multiplanar and multiecho pulse sequences of the cervical spine, to include the craniocervical junction and cervicothoracic junction,  were obtained without and with intravenous contrast. CONTRAST:  9 cc Gadavist COMPARISON:  CT scan of the cervical spine dated 11/09/2018 FINDINGS: Alignment: Physiologic. Vertebrae: Tumor infiltrates almost the entire C2 vertebral body including the odontoid process. Tumor expands the vertebral body extends into both lateral masses, more to the right than the left. Tumor extends into the spinal canal from the posterior margin of the vertebral body and extends into both neural foramina, right more than left and extends into the prevertebral soft tissues. The other bones of the cervical spine demonstrate no metastatic disease. Cord: The cervical spinal cord appears normal. The tumor at C2 does not compress the spinal cord or compromise the spinal canal significantly. Posterior Fossa, vertebral arteries, paraspinal tissues: The tumor is immediately adjacent to the vertebral arteries at C2 and the right vertebral artery is compressed on image 12 of series 6 by tumor. Other than the extension of tumor into the paraspinal soft tissues at C2, paraspinal soft tissues are otherwise normal. Disc levels: C2-3: The disc is normal. Tumor extends into the neural foramina, right greater than left. Compression of the right vertebral artery by tumor. Tumor extension into the spinal canal without impingement upon the spinal cord. C3-4: Tiny broad-based disc bulge with small uncinate spurs asymmetric to the left without neural impingement. C4-5: Minimal uncinate spurs to the left without neural impingement. C5-6: Tiny central disc bulge with no neural impingement. C6-7: Disc space narrowing. Small uncinate spurs to the right and left without neural impingement. C7-T1: Normal. IMPRESSION: 1. Tumor infiltrates and destroys the majority of the C2 vertebral body. Tumor extends into  the anterior aspect of the spinal canal at C2 without impingement upon the spinal cord. Tumor extends into both neural foramina at C2-3, right greater than  left. 2. Tumor compresses the right vertebral artery at the C2 level. Electronically Signed   By: Lorriane Shire M.D.   On: 11/18/2018 18:57       IMPRESSION/PLAN: 1. Metastatic colon cancer to C2 vertebral body. I discussed the patient's case with Dr. Lisbeth Renshaw who is reviewed his imaging to date.  The patient does not appear to be a good candidate for stereotactic radiosurgery but would rather benefit from palliative radiotherapy.  He has been on low-dose steroids which seemed to help initially but have not significantly reduced his symptoms.  We discussed the rationale to consider 10 fractions of palliative treatment over 2 weeks time.  Given the proximity of the patient in [place] to other cancer centers, his wife prefers referral [facility] Francesca Jewett.  I have contacted his staff will pass along the message and placed a stat referral.  We did discuss the risks, benefits, short and long-term effects of radiotherapy in the setting of giving a palliative treatment to this location.  At the end of the conversation the patient's wife states agreement and understanding.  We would be happy to revisit any further discussion of stereotactic procedures at a later date should there be a need for salvage.  She is in agreement with this plan.  Given current concerns for patient exposure during the COVID-19 pandemic, this encounter was conducted via telephone.  The patient has given verbal consent for this type of encounter. The time spent during this encounter was 30 minutes and 50% of that time was spent in the coordination of his care. The attendants for this meeting include Shona Simpson, [facility] and Braulio Conte and Leonette Monarch  During the encounter, [facility] was located at [facility] Radiation Oncology Department.  Aadi and Aurelio Mccamy were located at home.     Carola Rhine, PAC

## 2018-12-04 DIAGNOSIS — C7802 Secondary malignant neoplasm of left lung: Secondary | ICD-10-CM | POA: Diagnosis not present

## 2018-12-04 DIAGNOSIS — C787 Secondary malignant neoplasm of liver and intrahepatic bile duct: Secondary | ICD-10-CM | POA: Diagnosis not present

## 2018-12-04 DIAGNOSIS — G893 Neoplasm related pain (acute) (chronic): Secondary | ICD-10-CM | POA: Diagnosis not present

## 2018-12-04 DIAGNOSIS — Z808 Family history of malignant neoplasm of other organs or systems: Secondary | ICD-10-CM | POA: Diagnosis not present

## 2018-12-04 DIAGNOSIS — C7801 Secondary malignant neoplasm of right lung: Secondary | ICD-10-CM | POA: Diagnosis not present

## 2018-12-04 DIAGNOSIS — C189 Malignant neoplasm of colon, unspecified: Secondary | ICD-10-CM | POA: Diagnosis not present

## 2018-12-04 DIAGNOSIS — Z510 Encounter for antineoplastic radiation therapy: Secondary | ICD-10-CM | POA: Diagnosis not present

## 2018-12-04 DIAGNOSIS — C7951 Secondary malignant neoplasm of bone: Secondary | ICD-10-CM | POA: Diagnosis not present

## 2018-12-04 DIAGNOSIS — C187 Malignant neoplasm of sigmoid colon: Secondary | ICD-10-CM | POA: Diagnosis not present

## 2018-12-04 DIAGNOSIS — Z87891 Personal history of nicotine dependence: Secondary | ICD-10-CM | POA: Diagnosis not present

## 2018-12-04 DIAGNOSIS — C19 Malignant neoplasm of rectosigmoid junction: Secondary | ICD-10-CM | POA: Diagnosis not present

## 2018-12-04 DIAGNOSIS — M899 Disorder of bone, unspecified: Secondary | ICD-10-CM | POA: Diagnosis not present

## 2018-12-04 DIAGNOSIS — Z9221 Personal history of antineoplastic chemotherapy: Secondary | ICD-10-CM | POA: Diagnosis not present

## 2018-12-07 ENCOUNTER — Encounter (HOSPITAL_COMMUNITY): Payer: Self-pay | Admitting: *Deleted

## 2018-12-07 ENCOUNTER — Other Ambulatory Visit (HOSPITAL_COMMUNITY): Payer: Self-pay | Admitting: Hematology

## 2018-12-07 DIAGNOSIS — C7801 Secondary malignant neoplasm of right lung: Secondary | ICD-10-CM | POA: Diagnosis not present

## 2018-12-07 DIAGNOSIS — R519 Headache, unspecified: Secondary | ICD-10-CM

## 2018-12-07 DIAGNOSIS — Z510 Encounter for antineoplastic radiation therapy: Secondary | ICD-10-CM | POA: Diagnosis not present

## 2018-12-07 DIAGNOSIS — C7802 Secondary malignant neoplasm of left lung: Secondary | ICD-10-CM | POA: Diagnosis not present

## 2018-12-07 DIAGNOSIS — M899 Disorder of bone, unspecified: Secondary | ICD-10-CM | POA: Diagnosis not present

## 2018-12-07 DIAGNOSIS — Z87891 Personal history of nicotine dependence: Secondary | ICD-10-CM | POA: Diagnosis not present

## 2018-12-07 DIAGNOSIS — C787 Secondary malignant neoplasm of liver and intrahepatic bile duct: Secondary | ICD-10-CM | POA: Diagnosis not present

## 2018-12-07 DIAGNOSIS — Z9221 Personal history of antineoplastic chemotherapy: Secondary | ICD-10-CM | POA: Diagnosis not present

## 2018-12-07 DIAGNOSIS — C19 Malignant neoplasm of rectosigmoid junction: Secondary | ICD-10-CM | POA: Diagnosis not present

## 2018-12-07 DIAGNOSIS — Z808 Family history of malignant neoplasm of other organs or systems: Secondary | ICD-10-CM | POA: Diagnosis not present

## 2018-12-07 DIAGNOSIS — C7951 Secondary malignant neoplasm of bone: Secondary | ICD-10-CM | POA: Diagnosis not present

## 2018-12-07 DIAGNOSIS — C187 Malignant neoplasm of sigmoid colon: Secondary | ICD-10-CM | POA: Diagnosis not present

## 2018-12-07 MED ORDER — HYDROMORPHONE HCL 2 MG PO TABS: 1.0000 mg | ORAL_TABLET | ORAL | 0 refills | Status: DC | PRN

## 2018-12-07 NOTE — Progress Notes (Signed)
I received a call from patient's wife, Rise Paganini, today. She states that patient is still in excruciating pain.  He has went to his first radiation appointment in [place].  He is out of pain medications.  She said that the medication is not helping him and he has had to take an immediate release pain pill this afternoon.  She wants to know why we are not able to get his pain under control.  I explained the goals of pain management with her. I explained that we will not be able to get Christopher Friedman out of pain and our goals should be focused now on just controlling the pain.  I explained that our goal is to help him be at a level where he can manage it and still function.  I reiterated that the pain will not be gone completely.  She verbalizes understanding.  She asks if there is anything that we can do other than pain medicine.  I explained again that the radiation is palliative and intended to try and control his symptoms.  The pain medication is to help with the symptoms as well but we will not have him at a 0/10 pain level.  She said she understands.    I discussed the above with Dr. Delton Coombes and he is calling in some different medication to his pharmacy.

## 2018-12-08 ENCOUNTER — Other Ambulatory Visit (HOSPITAL_COMMUNITY): Payer: Self-pay | Admitting: Nurse Practitioner

## 2018-12-08 DIAGNOSIS — Z808 Family history of malignant neoplasm of other organs or systems: Secondary | ICD-10-CM | POA: Diagnosis not present

## 2018-12-08 DIAGNOSIS — C19 Malignant neoplasm of rectosigmoid junction: Secondary | ICD-10-CM | POA: Diagnosis not present

## 2018-12-08 DIAGNOSIS — Z510 Encounter for antineoplastic radiation therapy: Secondary | ICD-10-CM | POA: Diagnosis not present

## 2018-12-08 DIAGNOSIS — C7801 Secondary malignant neoplasm of right lung: Secondary | ICD-10-CM | POA: Diagnosis not present

## 2018-12-08 DIAGNOSIS — Z87891 Personal history of nicotine dependence: Secondary | ICD-10-CM | POA: Diagnosis not present

## 2018-12-08 DIAGNOSIS — C787 Secondary malignant neoplasm of liver and intrahepatic bile duct: Secondary | ICD-10-CM | POA: Diagnosis not present

## 2018-12-08 DIAGNOSIS — M899 Disorder of bone, unspecified: Secondary | ICD-10-CM | POA: Diagnosis not present

## 2018-12-08 DIAGNOSIS — Z9221 Personal history of antineoplastic chemotherapy: Secondary | ICD-10-CM | POA: Diagnosis not present

## 2018-12-08 DIAGNOSIS — C7802 Secondary malignant neoplasm of left lung: Secondary | ICD-10-CM | POA: Diagnosis not present

## 2018-12-09 DIAGNOSIS — Z510 Encounter for antineoplastic radiation therapy: Secondary | ICD-10-CM | POA: Diagnosis not present

## 2018-12-09 DIAGNOSIS — M899 Disorder of bone, unspecified: Secondary | ICD-10-CM | POA: Diagnosis not present

## 2018-12-09 DIAGNOSIS — C19 Malignant neoplasm of rectosigmoid junction: Secondary | ICD-10-CM | POA: Diagnosis not present

## 2018-12-09 DIAGNOSIS — Z87891 Personal history of nicotine dependence: Secondary | ICD-10-CM | POA: Diagnosis not present

## 2018-12-09 DIAGNOSIS — C7801 Secondary malignant neoplasm of right lung: Secondary | ICD-10-CM | POA: Diagnosis not present

## 2018-12-09 DIAGNOSIS — C7802 Secondary malignant neoplasm of left lung: Secondary | ICD-10-CM | POA: Diagnosis not present

## 2018-12-09 DIAGNOSIS — Z808 Family history of malignant neoplasm of other organs or systems: Secondary | ICD-10-CM | POA: Diagnosis not present

## 2018-12-09 DIAGNOSIS — Z9221 Personal history of antineoplastic chemotherapy: Secondary | ICD-10-CM | POA: Diagnosis not present

## 2018-12-09 DIAGNOSIS — C787 Secondary malignant neoplasm of liver and intrahepatic bile duct: Secondary | ICD-10-CM | POA: Diagnosis not present

## 2018-12-10 DIAGNOSIS — C787 Secondary malignant neoplasm of liver and intrahepatic bile duct: Secondary | ICD-10-CM | POA: Diagnosis not present

## 2018-12-10 DIAGNOSIS — R0989 Other specified symptoms and signs involving the circulatory and respiratory systems: Secondary | ICD-10-CM | POA: Diagnosis not present

## 2018-12-10 DIAGNOSIS — Z03818 Encounter for observation for suspected exposure to other biological agents ruled out: Secondary | ICD-10-CM | POA: Diagnosis not present

## 2018-12-10 DIAGNOSIS — E860 Dehydration: Secondary | ICD-10-CM | POA: Diagnosis not present

## 2018-12-10 DIAGNOSIS — R000 Tachycardia, unspecified: Secondary | ICD-10-CM | POA: Diagnosis not present

## 2018-12-10 DIAGNOSIS — C7951 Secondary malignant neoplasm of bone: Secondary | ICD-10-CM | POA: Diagnosis not present

## 2018-12-10 DIAGNOSIS — M5489 Other dorsalgia: Secondary | ICD-10-CM | POA: Diagnosis not present

## 2018-12-10 DIAGNOSIS — C7800 Secondary malignant neoplasm of unspecified lung: Secondary | ICD-10-CM | POA: Diagnosis not present

## 2018-12-10 DIAGNOSIS — R0902 Hypoxemia: Secondary | ICD-10-CM | POA: Diagnosis not present

## 2018-12-10 DIAGNOSIS — M545 Low back pain: Secondary | ICD-10-CM | POA: Diagnosis not present

## 2018-12-10 DIAGNOSIS — A419 Sepsis, unspecified organism: Secondary | ICD-10-CM | POA: Diagnosis not present

## 2018-12-10 DIAGNOSIS — J189 Pneumonia, unspecified organism: Secondary | ICD-10-CM | POA: Diagnosis not present

## 2018-12-10 DIAGNOSIS — R072 Precordial pain: Secondary | ICD-10-CM | POA: Diagnosis not present

## 2018-12-10 DIAGNOSIS — R918 Other nonspecific abnormal finding of lung field: Secondary | ICD-10-CM | POA: Diagnosis not present

## 2018-12-10 DIAGNOSIS — E222 Syndrome of inappropriate secretion of antidiuretic hormone: Secondary | ICD-10-CM | POA: Diagnosis not present

## 2018-12-10 DIAGNOSIS — J9601 Acute respiratory failure with hypoxia: Secondary | ICD-10-CM | POA: Diagnosis not present

## 2018-12-10 DIAGNOSIS — C189 Malignant neoplasm of colon, unspecified: Secondary | ICD-10-CM | POA: Diagnosis not present

## 2018-12-10 DIAGNOSIS — R079 Chest pain, unspecified: Secondary | ICD-10-CM | POA: Diagnosis not present

## 2018-12-10 DIAGNOSIS — R0602 Shortness of breath: Secondary | ICD-10-CM | POA: Diagnosis not present

## 2018-12-11 DIAGNOSIS — R918 Other nonspecific abnormal finding of lung field: Secondary | ICD-10-CM | POA: Diagnosis not present

## 2018-12-11 DIAGNOSIS — C189 Malignant neoplasm of colon, unspecified: Secondary | ICD-10-CM | POA: Diagnosis not present

## 2018-12-11 DIAGNOSIS — C799 Secondary malignant neoplasm of unspecified site: Secondary | ICD-10-CM | POA: Diagnosis not present

## 2018-12-11 DIAGNOSIS — I10 Essential (primary) hypertension: Secondary | ICD-10-CM | POA: Diagnosis not present

## 2018-12-11 DIAGNOSIS — J9601 Acute respiratory failure with hypoxia: Secondary | ICD-10-CM | POA: Diagnosis not present

## 2018-12-11 DIAGNOSIS — J189 Pneumonia, unspecified organism: Secondary | ICD-10-CM | POA: Diagnosis not present

## 2018-12-11 DIAGNOSIS — A419 Sepsis, unspecified organism: Secondary | ICD-10-CM | POA: Diagnosis not present

## 2018-12-11 DIAGNOSIS — R079 Chest pain, unspecified: Secondary | ICD-10-CM | POA: Diagnosis not present

## 2018-12-12 DIAGNOSIS — A419 Sepsis, unspecified organism: Secondary | ICD-10-CM | POA: Diagnosis not present

## 2018-12-12 DIAGNOSIS — J9601 Acute respiratory failure with hypoxia: Secondary | ICD-10-CM | POA: Diagnosis not present

## 2018-12-13 DIAGNOSIS — A419 Sepsis, unspecified organism: Secondary | ICD-10-CM | POA: Diagnosis not present

## 2018-12-13 DIAGNOSIS — J9601 Acute respiratory failure with hypoxia: Secondary | ICD-10-CM | POA: Diagnosis not present

## 2018-12-14 DIAGNOSIS — C3490 Malignant neoplasm of unspecified part of unspecified bronchus or lung: Secondary | ICD-10-CM | POA: Diagnosis not present

## 2018-12-14 DIAGNOSIS — C189 Malignant neoplasm of colon, unspecified: Secondary | ICD-10-CM | POA: Diagnosis not present

## 2018-12-14 DIAGNOSIS — I10 Essential (primary) hypertension: Secondary | ICD-10-CM | POA: Diagnosis not present

## 2018-12-14 DIAGNOSIS — J9601 Acute respiratory failure with hypoxia: Secondary | ICD-10-CM | POA: Diagnosis not present

## 2018-12-14 DIAGNOSIS — C799 Secondary malignant neoplasm of unspecified site: Secondary | ICD-10-CM | POA: Diagnosis not present

## 2018-12-14 DIAGNOSIS — J9610 Chronic respiratory failure, unspecified whether with hypoxia or hypercapnia: Secondary | ICD-10-CM | POA: Diagnosis not present

## 2018-12-14 DIAGNOSIS — A419 Sepsis, unspecified organism: Secondary | ICD-10-CM | POA: Diagnosis not present

## 2018-12-15 ENCOUNTER — Inpatient Hospital Stay (HOSPITAL_COMMUNITY): Payer: Medicare PPO | Attending: Hematology | Admitting: Hematology

## 2018-12-15 ENCOUNTER — Other Ambulatory Visit: Payer: Self-pay

## 2018-12-15 ENCOUNTER — Encounter (HOSPITAL_COMMUNITY): Payer: Self-pay | Admitting: Hematology

## 2018-12-15 DIAGNOSIS — J189 Pneumonia, unspecified organism: Secondary | ICD-10-CM | POA: Diagnosis not present

## 2018-12-15 DIAGNOSIS — G893 Neoplasm related pain (acute) (chronic): Secondary | ICD-10-CM | POA: Diagnosis not present

## 2018-12-15 DIAGNOSIS — C189 Malignant neoplasm of colon, unspecified: Secondary | ICD-10-CM | POA: Diagnosis not present

## 2018-12-15 DIAGNOSIS — R51 Headache: Secondary | ICD-10-CM

## 2018-12-15 DIAGNOSIS — A419 Sepsis, unspecified organism: Secondary | ICD-10-CM | POA: Diagnosis not present

## 2018-12-15 DIAGNOSIS — D630 Anemia in neoplastic disease: Secondary | ICD-10-CM | POA: Diagnosis not present

## 2018-12-15 DIAGNOSIS — C787 Secondary malignant neoplasm of liver and intrahepatic bile duct: Secondary | ICD-10-CM | POA: Diagnosis not present

## 2018-12-15 DIAGNOSIS — R519 Headache, unspecified: Secondary | ICD-10-CM

## 2018-12-15 DIAGNOSIS — J9601 Acute respiratory failure with hypoxia: Secondary | ICD-10-CM | POA: Diagnosis not present

## 2018-12-15 DIAGNOSIS — C7951 Secondary malignant neoplasm of bone: Secondary | ICD-10-CM

## 2018-12-15 MED ORDER — OXYCODONE HCL ER 10 MG PO T12A: 10.0000 mg | EXTENDED_RELEASE_TABLET | Freq: Two times a day (BID) | ORAL | 0 refills | Status: AC

## 2018-12-15 MED ORDER — HYDROMORPHONE HCL 2 MG PO TABS: 2.0000 mg | ORAL_TABLET | ORAL | 0 refills | Status: AC | PRN

## 2018-12-15 NOTE — Progress Notes (Signed)
Virtual Visit via Telephone Note  I connected with Christopher Friedman on 12/15/18 at  4:00 PM EDT by telephone and verified that I am speaking with the correct person using two identifiers.   I discussed the limitations, risks, security and privacy concerns of performing an evaluation and management service by telephone and the availability of in person appointments. I also discussed with the patient that there may be a patient responsible charge related to this service. The patient expressed understanding and agreed to proceed.   History of Present Illness: #1 metastatic colon cancer to the cervical and thoracic spine and liver, progression on traditional IV chemotherapy. 2.  Intractable pain in the neck, back and right upper quadrant. 3.  Recent admission to [facility] from 12/10/2018 through 12/14/2018 with bronchitis and fevers, cultures negative.   Observations/Objective: He came back to home last night from [place] any fevers or chills.  When he is not in pain he is able to walk in the house and able to eat.  He ran out of Dilaudid few hours ago.  He is in a lot of pain at this time.  Denies any nausea vomiting or diarrhea.  He was given Augmentin twice daily upon discharge.  He has received 3 out of 10 doses of radiation therapy for his neck pain.  Assessment and Plan:  1.  Intractable pain: - This is from metastasis to the C-spine and thoracic spine and the liver. -he is apparently taking Dilaudid 2 mg every 4 hours.  It is somewhat helping according to the wife. - I have sent a prescription for OxyContin 10 mg every 12 hours.  He will use Dilaudid 2 mg every 3 hours as needed for breakthrough pain.  I have sent a prescription for Dilaudid too. -I had a prolonged discussion with the wife about referring him to hospice for better pain control and palliation.  She said she is not ready for it yet.  She will let us know by calling later this week. -I have  suggested her that he complete radiation therapy if the patient is able. -Wife reports that the pain in the neck is better controlled now but the pain in the mid back and right upper quadrant is worse.  2.  Metastatic colon cancer: -He has progressed on FOLFOX and FOLFIRI-based regimens. - We thought of starting him on Stivarga.  However at this time he is not a candidate for further therapy, because of intractable pain and poor functional status.  3.  Febrile illness: -He was hospitalized at [facility] from 12/09/2020 12/14/2018.  I have talked to the hospitalist.  Reportedly cultures were negative. -He was sent home on Augmentin twice daily.  No fever since discharge was reported.   Follow Up Instructions:    I discussed the assessment and treatment plan with the patient. The patient was provided an opportunity to ask questions and all were answered. The patient agreed with the plan and demonstrated an understanding of the instructions.   The patient was advised to call back or seek an in-person evaluation if the symptoms worsen or if the condition fails to improve as anticipated.  I provided 30 minutes of non-face-to-face time during this encounter.   Derek Jack, MD

## 2018-12-17 DIAGNOSIS — A419 Sepsis, unspecified organism: Secondary | ICD-10-CM | POA: Diagnosis not present

## 2018-12-17 DIAGNOSIS — C7951 Secondary malignant neoplasm of bone: Secondary | ICD-10-CM | POA: Diagnosis not present

## 2018-12-17 DIAGNOSIS — D630 Anemia in neoplastic disease: Secondary | ICD-10-CM | POA: Diagnosis not present

## 2018-12-17 DIAGNOSIS — C189 Malignant neoplasm of colon, unspecified: Secondary | ICD-10-CM | POA: Diagnosis not present

## 2018-12-17 DIAGNOSIS — J9601 Acute respiratory failure with hypoxia: Secondary | ICD-10-CM | POA: Diagnosis not present

## 2018-12-17 DIAGNOSIS — J189 Pneumonia, unspecified organism: Secondary | ICD-10-CM | POA: Diagnosis not present

## 2018-12-19 DIAGNOSIS — Z20828 Contact with and (suspected) exposure to other viral communicable diseases: Secondary | ICD-10-CM | POA: Diagnosis not present

## 2018-12-19 DIAGNOSIS — R0902 Hypoxemia: Secondary | ICD-10-CM | POA: Diagnosis not present

## 2018-12-19 DIAGNOSIS — J9621 Acute and chronic respiratory failure with hypoxia: Secondary | ICD-10-CM | POA: Diagnosis not present

## 2018-12-19 DIAGNOSIS — Z7189 Other specified counseling: Secondary | ICD-10-CM | POA: Diagnosis not present

## 2018-12-19 DIAGNOSIS — Z87891 Personal history of nicotine dependence: Secondary | ICD-10-CM | POA: Diagnosis not present

## 2018-12-19 DIAGNOSIS — R279 Unspecified lack of coordination: Secondary | ICD-10-CM | POA: Diagnosis not present

## 2018-12-19 DIAGNOSIS — E876 Hypokalemia: Secondary | ICD-10-CM | POA: Diagnosis not present

## 2018-12-19 DIAGNOSIS — R918 Other nonspecific abnormal finding of lung field: Secondary | ICD-10-CM | POA: Diagnosis not present

## 2018-12-19 DIAGNOSIS — B370 Candidal stomatitis: Secondary | ICD-10-CM | POA: Diagnosis not present

## 2018-12-19 DIAGNOSIS — A403 Sepsis due to Streptococcus pneumoniae: Secondary | ICD-10-CM | POA: Diagnosis not present

## 2018-12-19 DIAGNOSIS — C7800 Secondary malignant neoplasm of unspecified lung: Secondary | ICD-10-CM | POA: Diagnosis not present

## 2018-12-19 DIAGNOSIS — J9601 Acute respiratory failure with hypoxia: Secondary | ICD-10-CM | POA: Diagnosis not present

## 2018-12-19 DIAGNOSIS — J189 Pneumonia, unspecified organism: Secondary | ICD-10-CM | POA: Diagnosis not present

## 2018-12-19 DIAGNOSIS — R000 Tachycardia, unspecified: Secondary | ICD-10-CM | POA: Diagnosis not present

## 2018-12-19 DIAGNOSIS — Z9981 Dependence on supplemental oxygen: Secondary | ICD-10-CM | POA: Diagnosis not present

## 2018-12-19 DIAGNOSIS — C189 Malignant neoplasm of colon, unspecified: Secondary | ICD-10-CM | POA: Diagnosis not present

## 2018-12-19 DIAGNOSIS — R410 Disorientation, unspecified: Secondary | ICD-10-CM | POA: Diagnosis not present

## 2018-12-19 DIAGNOSIS — C7802 Secondary malignant neoplasm of left lung: Secondary | ICD-10-CM | POA: Diagnosis not present

## 2018-12-19 DIAGNOSIS — F419 Anxiety disorder, unspecified: Secondary | ICD-10-CM | POA: Diagnosis not present

## 2018-12-19 DIAGNOSIS — G8929 Other chronic pain: Secondary | ICD-10-CM | POA: Diagnosis not present

## 2018-12-19 DIAGNOSIS — Z743 Need for continuous supervision: Secondary | ICD-10-CM | POA: Diagnosis not present

## 2018-12-19 DIAGNOSIS — Z515 Encounter for palliative care: Secondary | ICD-10-CM | POA: Diagnosis not present

## 2018-12-19 DIAGNOSIS — C7989 Secondary malignant neoplasm of other specified sites: Secondary | ICD-10-CM | POA: Diagnosis not present

## 2018-12-19 DIAGNOSIS — R0600 Dyspnea, unspecified: Secondary | ICD-10-CM | POA: Diagnosis not present

## 2018-12-19 DIAGNOSIS — A4189 Other specified sepsis: Secondary | ICD-10-CM | POA: Diagnosis not present

## 2018-12-19 DIAGNOSIS — C19 Malignant neoplasm of rectosigmoid junction: Secondary | ICD-10-CM | POA: Diagnosis not present

## 2018-12-19 DIAGNOSIS — C7801 Secondary malignant neoplasm of right lung: Secondary | ICD-10-CM | POA: Diagnosis not present

## 2018-12-19 DIAGNOSIS — C787 Secondary malignant neoplasm of liver and intrahepatic bile duct: Secondary | ICD-10-CM | POA: Diagnosis not present

## 2018-12-19 DIAGNOSIS — R0602 Shortness of breath: Secondary | ICD-10-CM | POA: Diagnosis not present

## 2018-12-19 DIAGNOSIS — C7951 Secondary malignant neoplasm of bone: Secondary | ICD-10-CM | POA: Diagnosis not present

## 2018-12-19 DIAGNOSIS — A419 Sepsis, unspecified organism: Secondary | ICD-10-CM | POA: Diagnosis not present

## 2018-12-19 DIAGNOSIS — J188 Other pneumonia, unspecified organism: Secondary | ICD-10-CM | POA: Diagnosis not present

## 2018-12-19 DIAGNOSIS — E872 Acidosis: Secondary | ICD-10-CM | POA: Diagnosis not present

## 2018-12-19 DIAGNOSIS — E785 Hyperlipidemia, unspecified: Secondary | ICD-10-CM | POA: Diagnosis not present

## 2018-12-19 DIAGNOSIS — I10 Essential (primary) hypertension: Secondary | ICD-10-CM | POA: Diagnosis not present

## 2018-12-29 DEATH — deceased

## 2018-12-30 ENCOUNTER — Other Ambulatory Visit (HOSPITAL_COMMUNITY): Payer: Self-pay | Admitting: Nurse Practitioner

## 2020-03-09 IMAGING — CT CT ABD-PELV W/ CM
2 of 5 series · 14 of 46 positions shown, 16 images · IV contrast (Isovue)
Comparison: 08/18/2017

CLINICAL DATA: Stage IV colon cancer with liver and lung
metastasis. Status post colon resection and chemotherapy.

EXAM:
CT CHEST, ABDOMEN, AND PELVIS WITH CONTRAST
TECHNIQUE: Multidetector CT imaging of the chest, abdomen and pelvis was
performed following the standard protocol during bolus
administration of intravenous contrast.
CONTRAST:  100mL [id number] IOPAMIDOL ([id number]) INJECTION 61%

[Series 2: cap with · axial · 0.77mm/px · z∈[+889,+1389]mm · 11 of 122 slices shown, 13 images]
[im 11/122  soft-tissue]
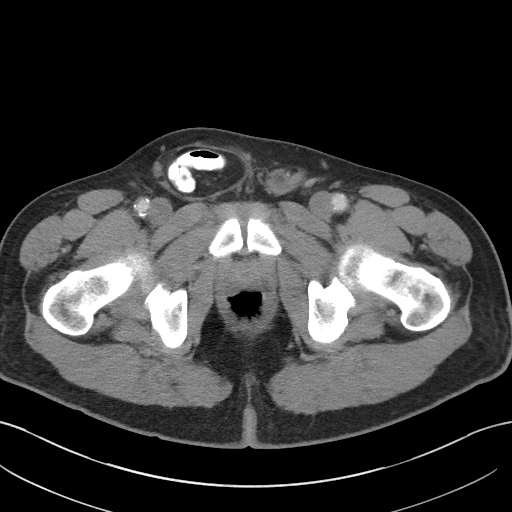
[im 11/122  bone]
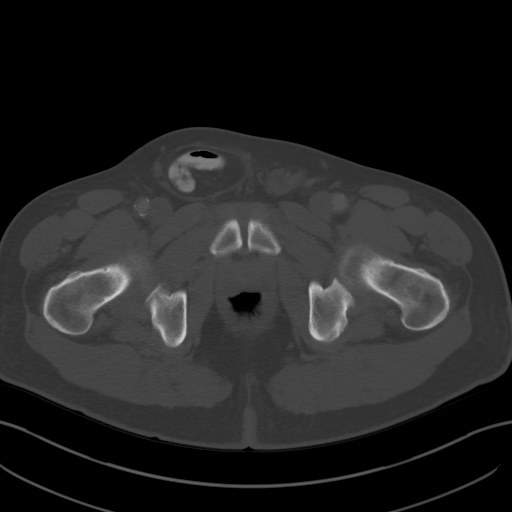
[im 21/122  soft-tissue]
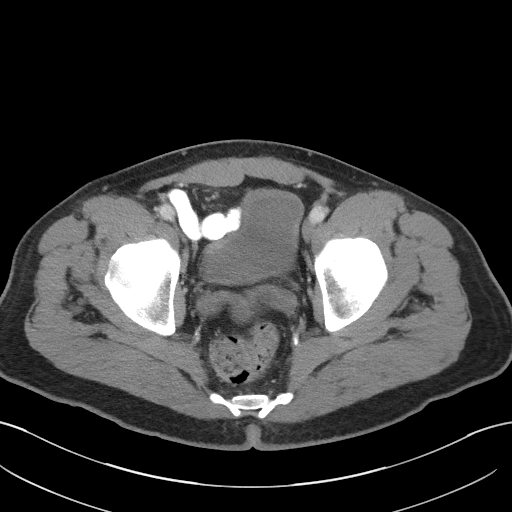
[im 31/122  soft-tissue]
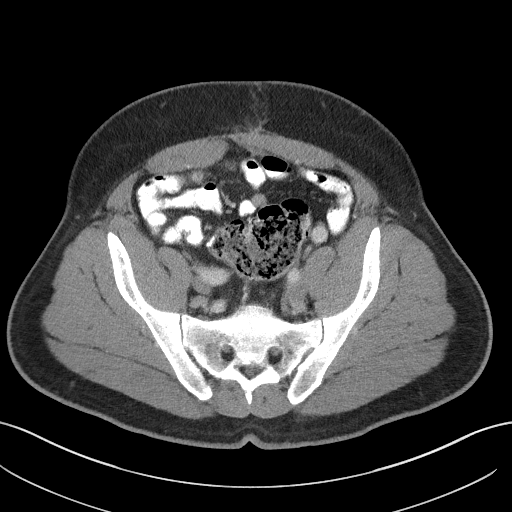
[im 41/122  soft-tissue]
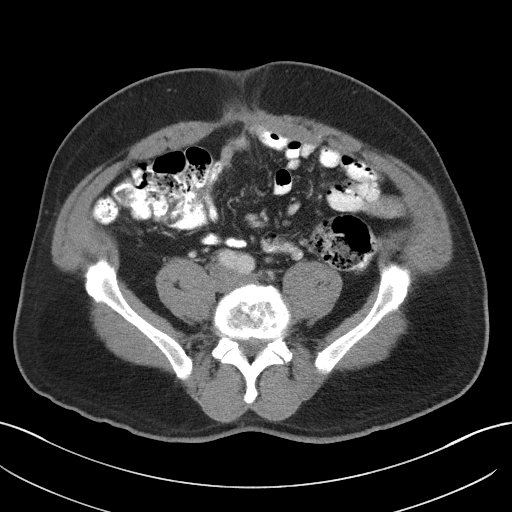
[im 51/122  soft-tissue]
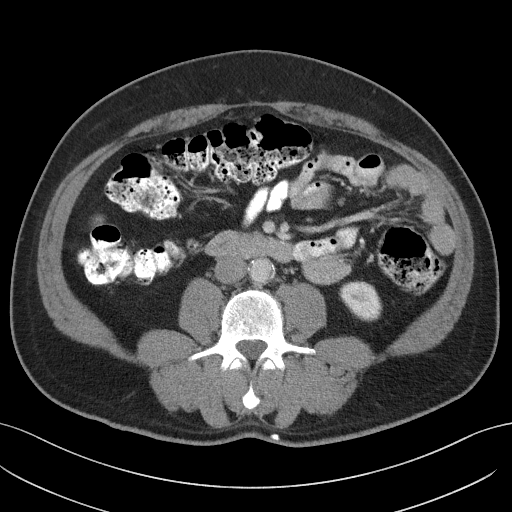
[im 61/122  soft-tissue]
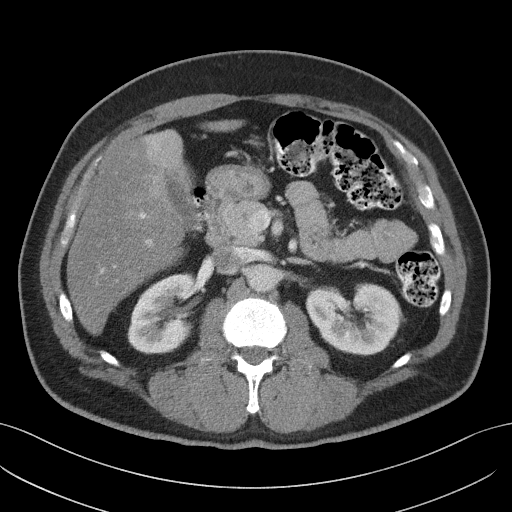
[im 71/122  soft-tissue]
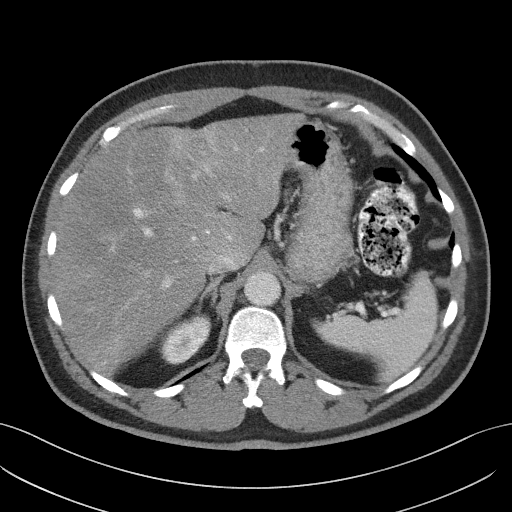
[im 81/122  soft-tissue]
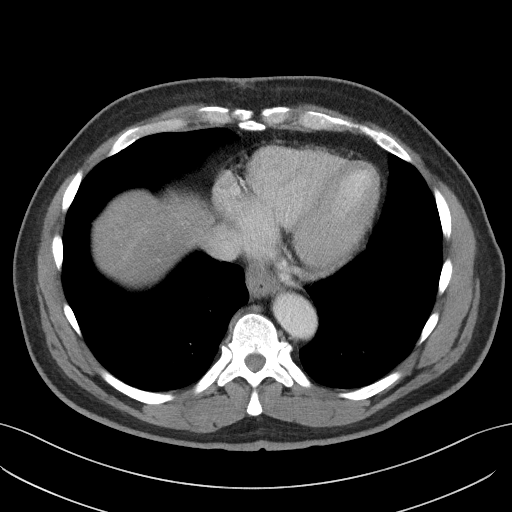
[im 91/122  soft-tissue]
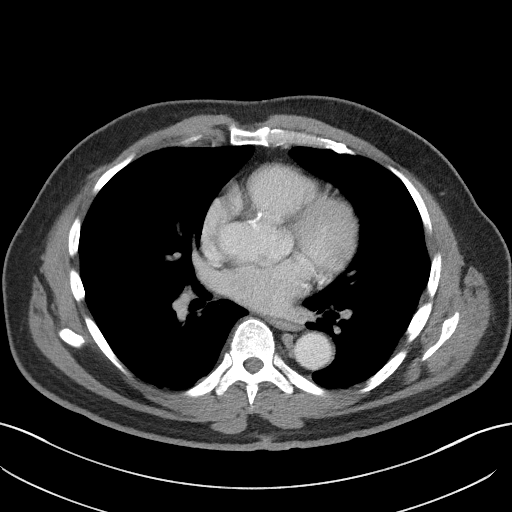
[im 91/122  bone]
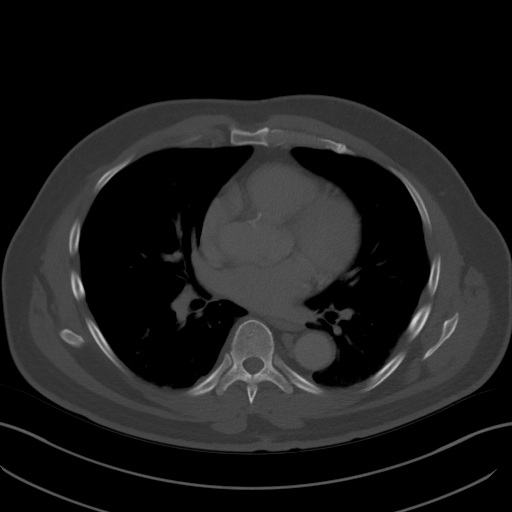
[im 101/122  soft-tissue]
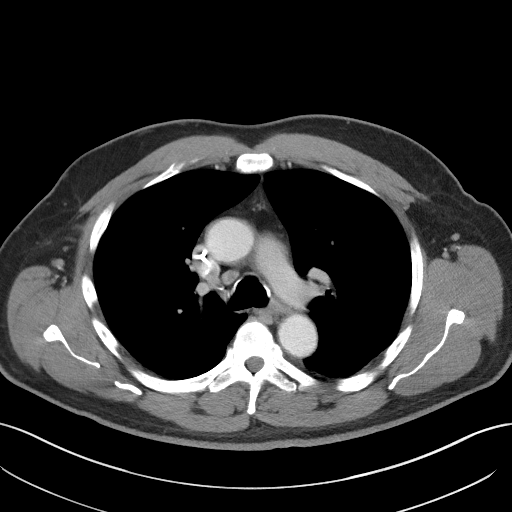
[im 111/122  soft-tissue]
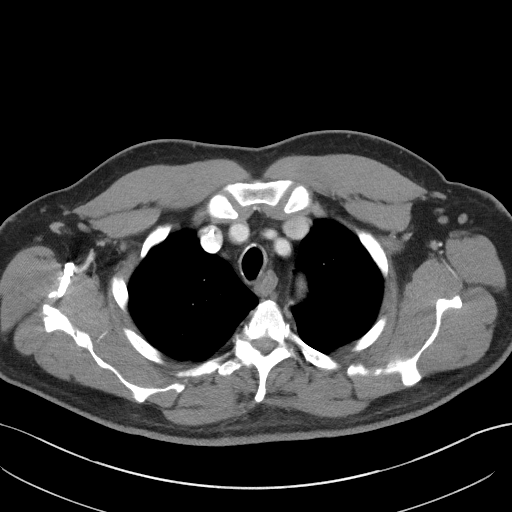

[Series 5: coronals · coronal · 0.82mm/px · 3 of 158 slices shown]
[im 53/158  soft-tissue]
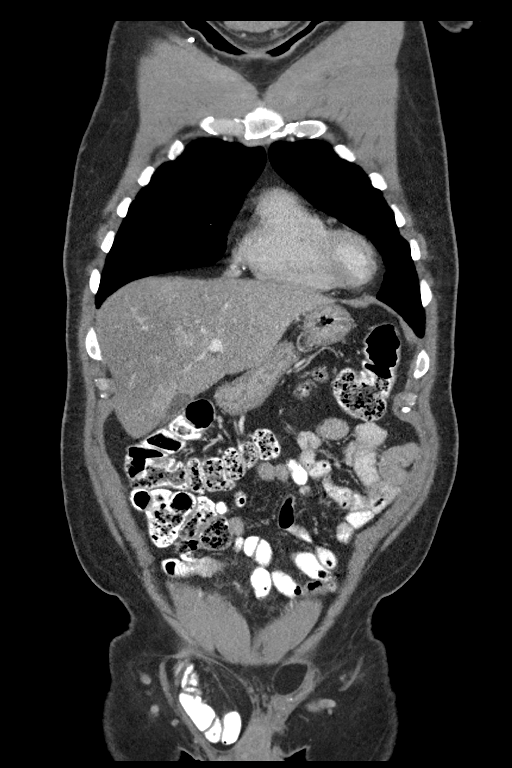
[im 70/158  soft-tissue]
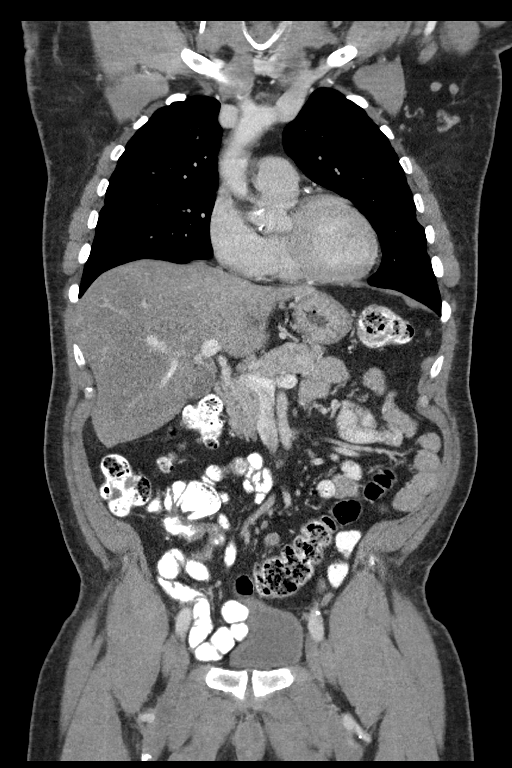
[im 88/158  soft-tissue]
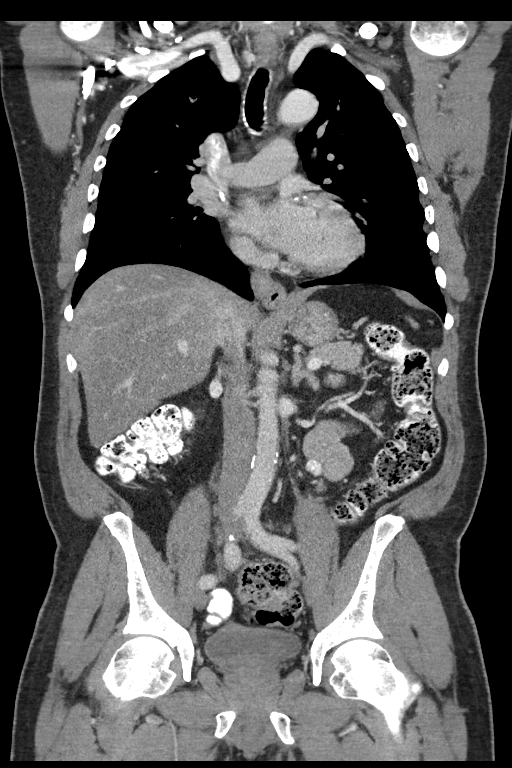

[14 of 46 positions shown; findings below may reference images not displayed]

FINDINGS: CT CHEST FINDINGS

Cardiovascular: Right Port-A-Cath which terminates at the superior
caval/atrial junction. Aortic and branch vessel atherosclerosis.
Tortuous thoracic aorta. Mild cardiomegaly, without pericardial
effusion. Multivessel coronary artery atherosclerosis. No central
pulmonary embolism, on this non-dedicated study.

Mediastinum/Nodes: No supraclavicular adenopathy. No mediastinal or
hilar adenopathy. Small hiatal hernia.

Lungs/Pleura: No pleural fluid. 4 mm left apical pulmonary nodule is
unchanged on [DATE]. A subpleural 2 mm right upper lobe pulmonary
nodule is 68/4.

Minimal nodularity along the right minor fissure is unchanged on
69/4.

A left lower lobe 5 mm nodule on 106/4 is unchanged.

Musculoskeletal: No acute osseous abnormality. Surgical or
posttraumatic changes in the right scapula.

CT ABDOMEN PELVIS FINDINGS

Hepatobiliary: Heterogeneous hepatic steatosis. This limits
evaluation for focal liver lesion. Segment 4 hypoattenuating lesion
measures 2.4 x 2.5 cm today versus 3.8 x 2.8 cm on the prior.

The lesion questioned in segments 5-8 on the prior exam is less
well-defined today. Vague hypoattenuation in this area on [DATE] is
favored to represent heterogeneous steatosis.

Normal gallbladder, without biliary ductal dilatation.

Pancreas: Normal, without mass or ductal dilatation.

Spleen: Normal in size, without focal abnormality.

Adrenals/Urinary Tract: Normal left adrenal gland. Right adrenal
nodule of 9 mm is unchanged. Too small to characterize left renal
lesions. Normal right kidney, without hydronephrosis or hydroureter.
Normal urinary bladder.

Stomach/Bowel: Normal distal stomach. Colonic stool burden suggests
constipation. Scattered colonic diverticula. Normal terminal ileum.
Surgical sutures at the sigmoid without locally recurrent disease.

Normal small bowel caliber. A right-sided inguinal hernia contains
nonobstructive small bowel, as before.

Vascular/Lymphatic: Aortic and branch vessel atherosclerosis. No
abdominopelvic adenopathy.

Reproductive: Normal prostate.

Other: No significant free fluid. No evidence of omental or
peritoneal disease. Fat containing left inguinal hernia.

Musculoskeletal: No acute osseous abnormality. Degenerate disc
disease at L4-5.
IMPRESSION: CT CHEST IMPRESSION

1. Similar bilateral pulmonary nodules. No new or progressive
disease in the chest.
2. No thoracic adenopathy.
3. Coronary artery atherosclerosis. Aortic Atherosclerosis
([id number]).

CT ABDOMEN AND PELVIS IMPRESSION

1. Decrease size of segment 4 liver lesion. Heterogeneous hepatic
steatosis, making evaluation for new liver lesions difficult.
2. No extrahepatic metastatic disease identified within the abdomen
or pelvis.
3. Similar nonspecific right adrenal nodule.
4. Bilateral inguinal hernias, with nonobstructive small bowel in
the right-sided hernia.

## 2020-06-03 IMAGING — CT CT CHEST W/ CM
2 of 10 series · 13 of 46 positions shown, 17 images · IV contrast (Isovue)
Comparison: CT CAP 11/11/2017

CLINICAL DATA: Patient with history of metastatic colon cancer.
Recent microwave ablation hepatic lesion.

EXAM:
CT CHEST, ABDOMEN, AND PELVIS WITH CONTRAST
TECHNIQUE: Multidetector CT imaging of the chest, abdomen and pelvis was
performed following the standard protocol during bolus
administration of intravenous contrast.
CONTRAST:  100mL [id number] IOPAMIDOL ([id number]) INJECTION 61%

[Series 3: axial venous · axial · portal-venous · 0.85mm/px · z∈[+1040,+1588]mm · 11 of 207 slices shown, 15 images]
[im 12/207  soft-tissue]
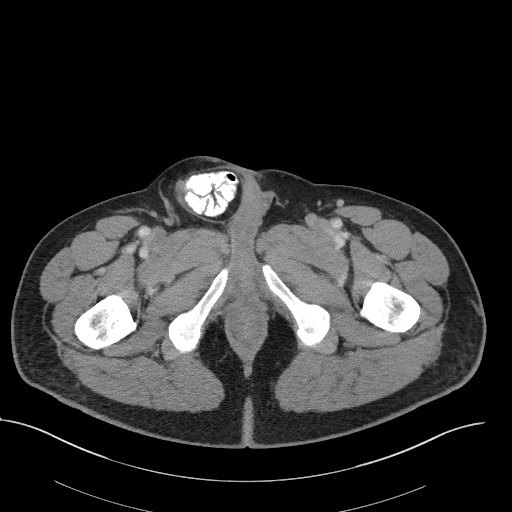
[im 12/207  bone]
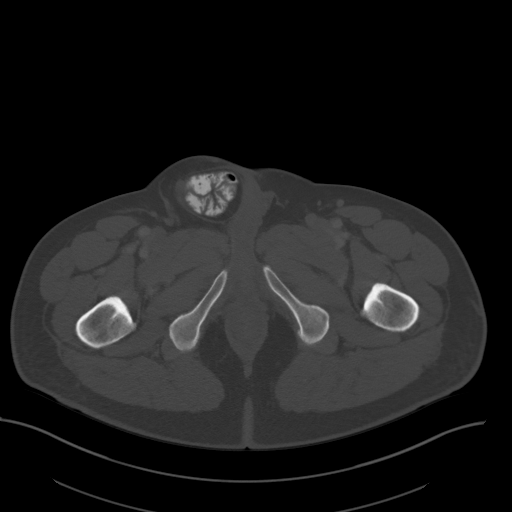
[im 35/207  soft-tissue]
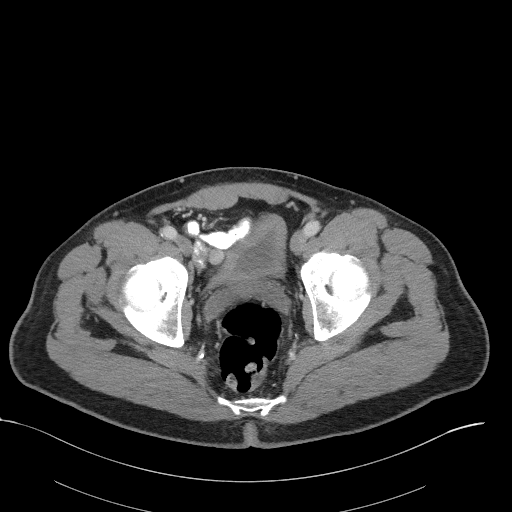
[im 58/207  soft-tissue]
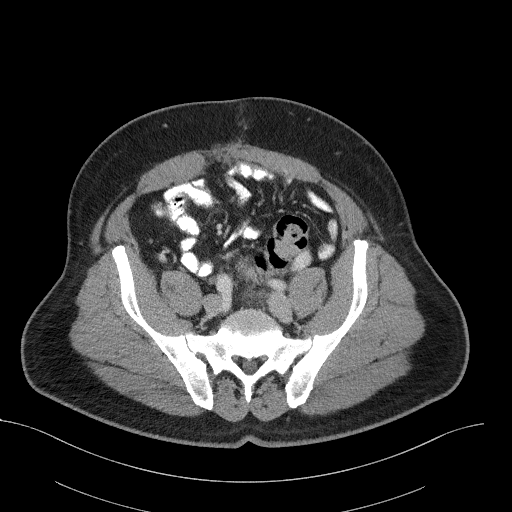
[im 81/207  soft-tissue]
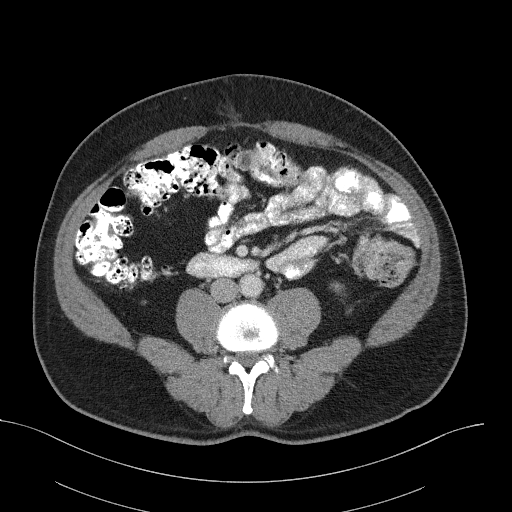
[im 104/207  soft-tissue]
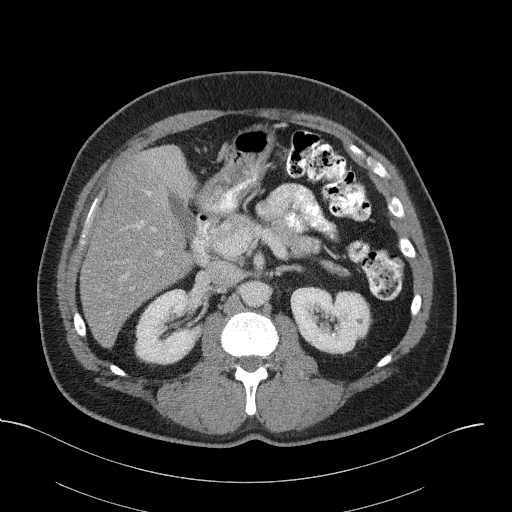
[im 126/207  soft-tissue]
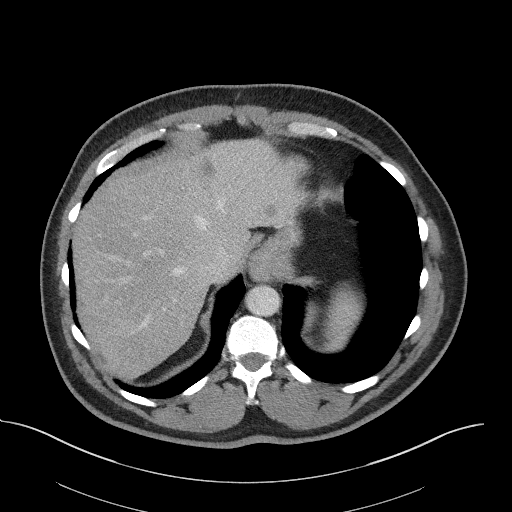
[im 149/207  soft-tissue]
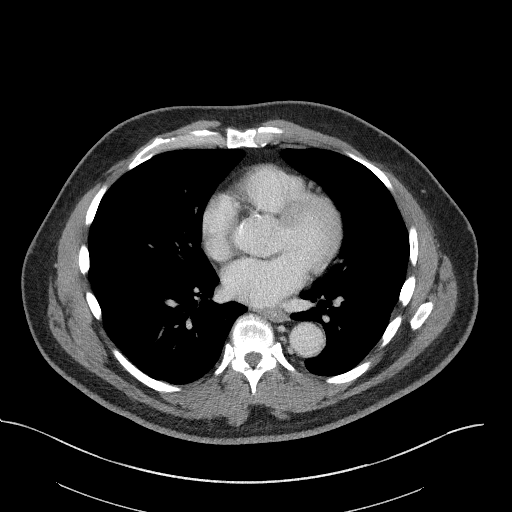
[im 161/207  lung]
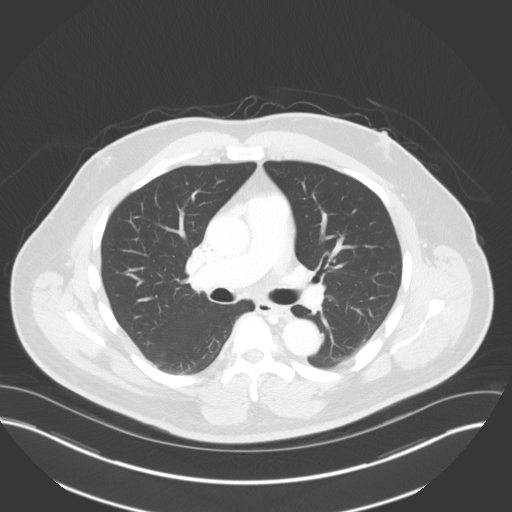
[im 172/207  soft-tissue]
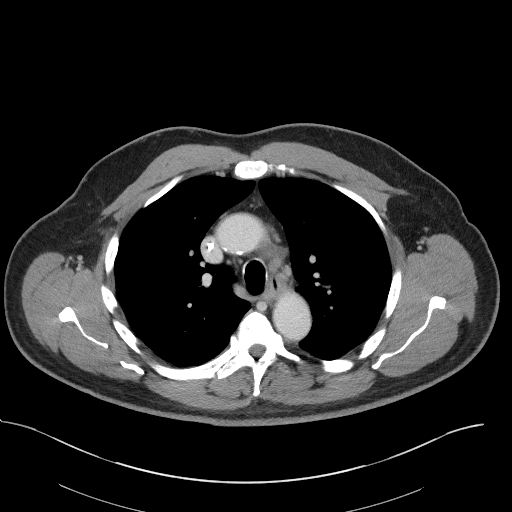
[im 172/207  lung]
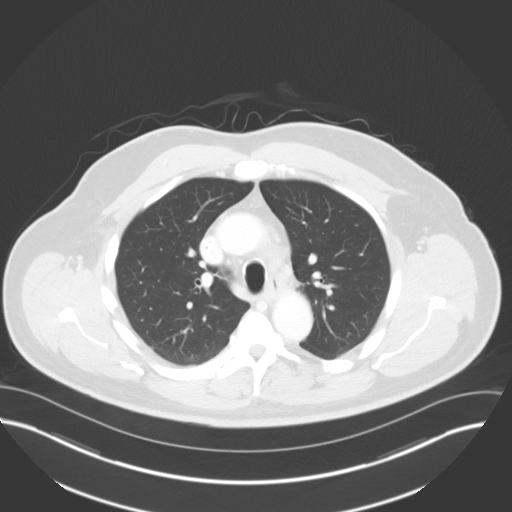
[im 184/207  lung]
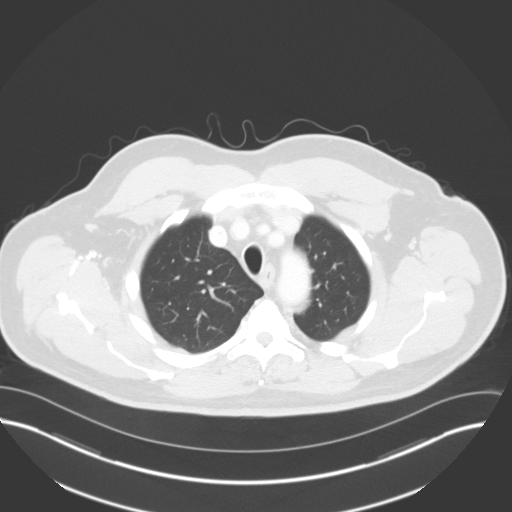
[im 195/207  soft-tissue]
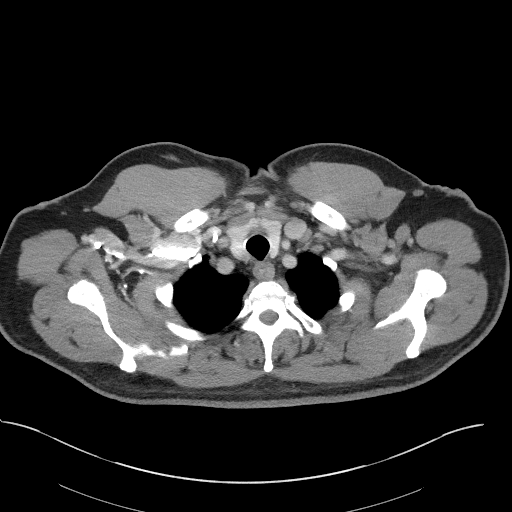
[im 195/207  lung]
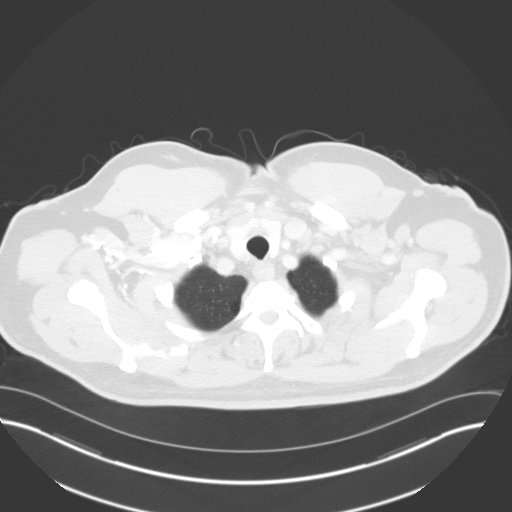
[im 195/207  bone]
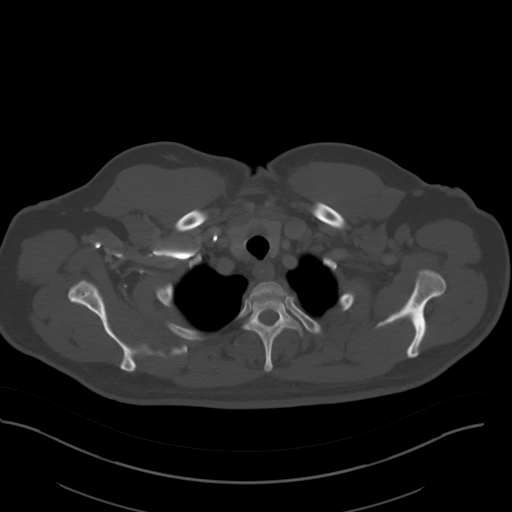

[Series 9: coronal arterial · coronal · arterial · 0.47mm/px · 2 of 105 slices shown]
[im 35/105  soft-tissue]
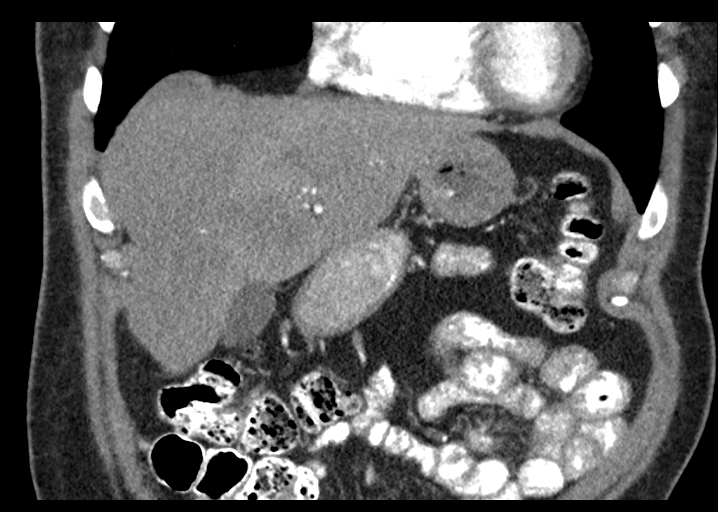
[im 70/105  soft-tissue]
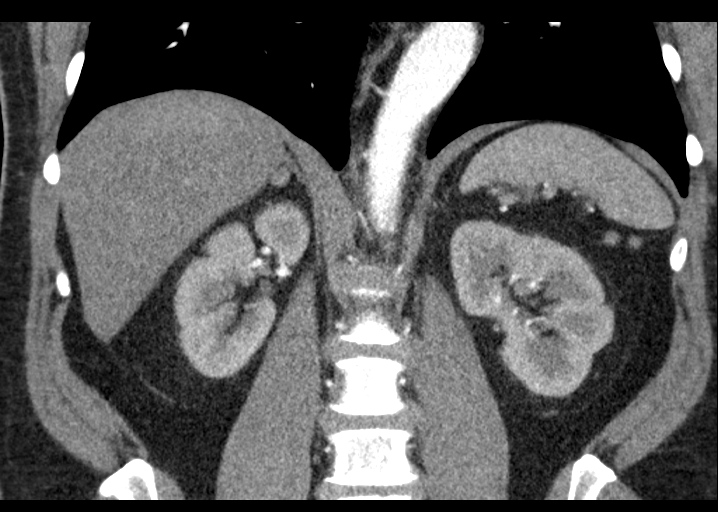

[13 of 46 positions shown; findings below may reference images not displayed]

FINDINGS: CT CHEST FINDINGS

Cardiovascular: Right anterior chest wall Port-A-Cath is present
with tip terminating in the superior vena cava. Normal heart size.
Trace fluid superior pericardial recess. Thoracic aortic vascular
calcifications. Coronary arterial vascular calcifications.

Mediastinum/Nodes: No enlarged axillary, mediastinal or hilar
lymphadenopathy. Small hiatal hernia. Otherwise normal appearance of
the esophagus.

Lungs/Pleura: Central airways are patent. Dependent atelectasis
within the bilateral lower lobes. Stable 4 mm left upper lobe nodule
(image 11; series 12). Stable 2 mm right upper lobe nodule (image
29; series 12). No large area pulmonary consolidation. No pleural
effusion or pneumothorax. Stable 5 mm left lower lobe nodule (image
48; series 12).

Musculoskeletal: No aggressive or acute appearing osseous lesions.

CT ABDOMEN PELVIS FINDINGS

Hepatobiliary: Interval development of a 1.3 cm low-attenuation
lesion left hepatic lobe (image 81; series 3). Interval post lytic
changes involving previously described left hepatic lobe lesion.
Overall low-attenuation involving the left hepatic lobe is increased
when compared to prior exam measuring up to 4.8 x 5.4 cm (image 70;
series 6), previously 3.8 x 2.8 cm. The majority of this is likely
postprocedural changes. Previously described right hepatic lobe
lesion not well demonstrated on current exam. Hepatic steatosis
limits evaluation for small lesions.

Pancreas: Unremarkable

Spleen: Unremarkable

Adrenals/Urinary Tract: Unchanged 9 mm right adrenal nodule (image
25; series 2). Kidneys enhance symmetrically with contrast. No
hydronephrosis. Urinary bladder is decompressed. Mild wall
thickening of the bladder.

Stomach/Bowel: Small hiatal hernia. Normal morphology of the
stomach. No evidence for bowel obstruction. No free fluid or free
intraperitoneal air. Stable postsurgical changes involving the
sigmoid colon.

Vascular/Lymphatic: Normal caliber abdominal aorta. Peripheral
calcified atherosclerotic plaque. No retroperitoneal
lymphadenopathy.

Reproductive: Prostate unremarkable.

Other: Fat and small bowel containing right inguinal hernia. Fat
containing left inguinal hernia.

Musculoskeletal: Lumbar spine degenerative changes. No aggressive or
acute appearing osseous lesions.
IMPRESSION: 1. Interval ablation of previously described left hepatic lobe
lesion now with central high attenuation and larger area of low
attenuation, potentially all post ablation changes. Given that the
regional area of low attenuation is increased when compared to prior
exam, recommend short-term follow-up in 3 months with pre and post
contrast-enhanced MRI.
2. New left hepatic lobe lesion raising the possibility of new
hepatic metastatic disease.
3. Similar-appearing small nodules within the lungs.
